# Patient Record
Sex: Male | Born: 1935
Health system: Southern US, Community
[De-identification: ages and names within clinical notes are randomized; demographics above are authoritative.]

## PROBLEM LIST (undated history)

## (undated) DIAGNOSIS — I2510 Atherosclerotic heart disease of native coronary artery without angina pectoris: Secondary | ICD-10-CM

## (undated) DIAGNOSIS — E119 Type 2 diabetes mellitus without complications: Secondary | ICD-10-CM

## (undated) DIAGNOSIS — G4733 Obstructive sleep apnea (adult) (pediatric): Secondary | ICD-10-CM

## (undated) DIAGNOSIS — I10 Essential (primary) hypertension: Secondary | ICD-10-CM

## (undated) DIAGNOSIS — I739 Peripheral vascular disease, unspecified: Secondary | ICD-10-CM

## (undated) DIAGNOSIS — E785 Hyperlipidemia, unspecified: Secondary | ICD-10-CM

## (undated) DIAGNOSIS — J101 Influenza due to other identified influenza virus with other respiratory manifestations: Secondary | ICD-10-CM

## (undated) DIAGNOSIS — M1990 Unspecified osteoarthritis, unspecified site: Secondary | ICD-10-CM

## (undated) DIAGNOSIS — Z9989 Dependence on other enabling machines and devices: Secondary | ICD-10-CM

## (undated) HISTORY — DX: Peripheral vascular disease, unspecified: I73.9

## (undated) HISTORY — PX: CARDIAC CATHETERIZATION: SHX172

## (undated) HISTORY — PX: NM MYOCAR PERF WALL MOTION: HXRAD629

## (undated) HISTORY — DX: Atherosclerotic heart disease of native coronary artery without angina pectoris: I25.10

## (undated) HISTORY — DX: Hyperlipidemia, unspecified: E78.5

## (undated) HISTORY — DX: Obstructive sleep apnea (adult) (pediatric): G47.33

## (undated) HISTORY — DX: Essential (primary) hypertension: I10

## (undated) HISTORY — DX: Dependence on other enabling machines and devices: Z99.89

## (undated) HISTORY — DX: Type 2 diabetes mellitus without complications: E11.9

---

## 2001-12-21 DIAGNOSIS — I739 Peripheral vascular disease, unspecified: Secondary | ICD-10-CM

## 2001-12-21 HISTORY — DX: Peripheral vascular disease, unspecified: I73.9

## 2002-05-19 ENCOUNTER — Ambulatory Visit (HOSPITAL_COMMUNITY): Admission: RE | Admit: 2002-05-19 | Discharge: 2002-05-19 | Payer: Self-pay | Admitting: *Deleted

## 2002-08-31 ENCOUNTER — Ambulatory Visit (HOSPITAL_COMMUNITY): Admission: RE | Admit: 2002-08-31 | Discharge: 2002-08-31 | Payer: Self-pay | Admitting: Cardiology

## 2004-11-10 ENCOUNTER — Encounter: Admission: RE | Admit: 2004-11-10 | Discharge: 2004-11-10 | Payer: Self-pay | Admitting: Endocrinology

## 2004-11-14 ENCOUNTER — Encounter: Admission: RE | Admit: 2004-11-14 | Discharge: 2004-11-14 | Payer: Self-pay | Admitting: Endocrinology

## 2005-12-21 DIAGNOSIS — I2510 Atherosclerotic heart disease of native coronary artery without angina pectoris: Secondary | ICD-10-CM

## 2005-12-21 HISTORY — DX: Atherosclerotic heart disease of native coronary artery without angina pectoris: I25.10

## 2006-04-23 ENCOUNTER — Encounter: Admission: RE | Admit: 2006-04-23 | Discharge: 2006-04-23 | Payer: Self-pay | Admitting: Neurosurgery

## 2006-05-18 ENCOUNTER — Ambulatory Visit (HOSPITAL_COMMUNITY): Admission: RE | Admit: 2006-05-18 | Discharge: 2006-05-19 | Payer: Self-pay | Admitting: Neurosurgery

## 2006-08-31 HISTORY — PX: CORONARY ARTERY BYPASS GRAFT: SHX141

## 2006-09-07 ENCOUNTER — Inpatient Hospital Stay (HOSPITAL_COMMUNITY): Admission: RE | Admit: 2006-09-07 | Discharge: 2006-09-13 | Payer: Self-pay | Admitting: Cardiothoracic Surgery

## 2006-10-07 ENCOUNTER — Encounter: Admission: RE | Admit: 2006-10-07 | Discharge: 2006-10-07 | Payer: Self-pay | Admitting: Cardiothoracic Surgery

## 2006-10-14 ENCOUNTER — Encounter (HOSPITAL_COMMUNITY): Admission: RE | Admit: 2006-10-14 | Discharge: 2007-01-12 | Payer: Self-pay | Admitting: Cardiology

## 2006-11-23 ENCOUNTER — Ambulatory Visit (HOSPITAL_BASED_OUTPATIENT_CLINIC_OR_DEPARTMENT_OTHER): Admission: RE | Admit: 2006-11-23 | Discharge: 2006-11-23 | Payer: Self-pay | Admitting: Orthopedic Surgery

## 2007-01-13 ENCOUNTER — Encounter (HOSPITAL_COMMUNITY): Admission: RE | Admit: 2007-01-13 | Discharge: 2007-04-05 | Payer: Self-pay | Admitting: Cardiology

## 2007-11-11 ENCOUNTER — Ambulatory Visit (HOSPITAL_COMMUNITY): Admission: RE | Admit: 2007-11-11 | Discharge: 2007-11-11 | Payer: Self-pay | Admitting: *Deleted

## 2011-05-05 NOTE — Op Note (Signed)
Rick Melendez, Rick Melendez                ACCOUNT NO.:  [id number]   MEDICAL RECORD NO.:  [id number]          PATIENT TYPE:  AMB   LOCATION:  ENDO                         FACILITY:  [facility]   PHYSICIAN:  Georgiana Spinner, M.D.    DATE OF BIRTH:  1936-06-11   DATE OF PROCEDURE:  DATE OF DISCHARGE:                               OPERATIVE REPORT   PROCEDURE:  Colonoscopy.   ANESTHESIA:  Fentanyl 60 mcg, Versed 6 mg.   INDICATIONS:  Colon polyps.   DESCRIPTION OF PROCEDURE:  With the patient mildly sedated in the left  lateral decubitus position, a rectal examination was performed which was  unremarkable to my exam. Subsequently the Pentax videoscopic colonoscope  was inserted into the rectum and passed under direct vision to reach the  cecum identified by ileocecal valve and appendiceal orifice both of  which were photographed. From this point, the colonoscope was slowly  withdrawn taking circumferential views of the colonic mucosa stopping  only in the rectum which appeared normal on direct and showed  hemorrhoids on retroflexed view. The endoscope was straightened and  withdrawn.  The patient's vital signs and pulse oximeter remained  stable.  The patient tolerated the procedure well without apparent  complications.   FINDINGS:  Internal hemorrhoids otherwise an unremarkable exam.   PLAN:  Consider repeat examination in 5 years.           ______________________________  Georgiana Spinner, M.D.     GMO/MEDQ  D:  11/11/2007  T:  11/11/2007  Job:  [id number]

## 2011-05-08 NOTE — Op Note (Signed)
NAMEJAROME, Rick Melendez                ACCOUNT NO.:  [id number]   MEDICAL RECORD NO.:  [id number]          PATIENT TYPE:  AMB   LOCATION:  SDS                          FACILITY:  [facility]   PHYSICIAN:  Reinaldo Meeker, M.D. DATE OF BIRTH:  Sep 24, 1936   DATE OF PROCEDURE:  05/18/2006  DATE OF DISCHARGE:                                 OPERATIVE REPORT   PREOPERATIVE DIAGNOSES:  Herniated disk and spinal stenosis C3-4, C4-5, C5-  6.   POSTOPERATIVE DIAGNOSES:  Herniated disk and spinal stenosis C3-4, C4-5, C5-  6.   PROCEDURE:  C3-4, C4-5, C5-6 anterior cervical diskectomy with bone bank  fusion followed by Accufix anterior cervical plating.   SURGEON:  Reinaldo Meeker, M.D.   ASSISTANT:  Kathaleen Maser. Pool, M.D.   DESCRIPTION OF PROCEDURE:  After being placed in the supine position in 10  pounds halter traction, the patient's neck was prepped and draped in the  usual sterile fashion. Localizing fluoroscopy was used prior to incision to  identify the appropriate level. A transverse incision was made in the right  anterior neck, started at the midline and headed towards the medial aspect  of the sternocleidomastoid muscle. The platysma muscle was then incised  transversely. The natural fascial plane between the strap medially and the  sternocleidomastoid laterally was identified and followed down to the  anterior aspect of the cervical spine. The longus colli muscles were  identified, split in the midline, and stripped away bilaterally with the  Kitner resector and the unipolar coagulation. A self retaining retractor was  placed for exposure. Fluoroscopy showed approach to the appropriate levels.  Large spurs from C4-5 and C5-6 were removed. The disks were then incised  with a 15 blade. Using a high speed drill, the disk spaces were widened as  they were markedly collapsed particularly C4-5 and C5-6. This was carried  down deep within the disk space and any residual disk material was  removed  with the pituitary rongeurs and curettes. At all three levels, approximately  95% of the disk material and bony overgrowth was removed. At this time, the  microscope was draped, brought on the field and used for the remainder of  the case. Starting at C3-4, a high-speed drill was used to remove the  remaining bony overgrowth until the posterior longitudinal ligament was  identified. This was removed in a piecemeal fashion. Thorough decompression  of the spinal dura was carried out until the spinal dura and proximal nerve  roots were well visualized bilaterally. A similar procedure was then carried  out at C4-5 once again using the high-speed drill to remove the remainder of  the bony overgrowth and then removing all residual disk material along with  the posterior longitudinal ligament which was removed in a piecemeal  fashion. Once again thorough spinal decompression was carried out  bilaterally until the proximal nerve roots could be identified. Finally,  attention was turned to C5-6 where once again a similar procedure was  carried out. Once again bony overgrowth was removed until the posterior  longitudinal ligament was identified. This was once  again removed in a  piecemeal fashion along with herniated disk material and bony overgrowth  until the spinal dura was well decompressed bilaterally. Proximal femoral  decompression was carried out bilaterally at this level as well. At this  time inspection was carried out in all directions at all three levels for  any evidence of residual compression and none could be identified. Large  amounts of irrigation were carried out. Measurements were taken and two 7 mm  plugs and one 8 mm plug was reconstituted. After irrigating once more and  confirming hemostasis, the plugs were then packed without difficulty with  the 7 mm plugs going into C3-4 and C5-6 and the 8 mm plug going into C4-5.  Fluoroscopy showed the plugs to be in good  position. An appropriate length  Accufix anterior cervical plate was then chosen. Under fluoroscopic  guidance, drill holes were placed followed by placement of 13 mm screws x8.  Locking mechanism was secured at all levels bilaterally and final  fluoroscopy showed the plates, screws and plugs all to be in good position.  At this time, large amounts of irrigation were carried out. Any bleeding was  controlled with bipolar coagulation. The wound was then closed using  interrupted Vicryl on the platysma muscle, inverted 5-0 PDS on the  subcuticular area and Steri-Strips on the skin. A sterile dressing was then  applied along with a Philadelphia collar. The patient was extubated and  taken to the recovery room in stable condition.           ______________________________  Reinaldo Meeker, M.D.     ROK/MEDQ  D:  05/18/2006  T:  05/18/2006  Job:  [id number]

## 2011-05-08 NOTE — Op Note (Signed)
Rick Melendez, Rick Melendez                ACCOUNT NO.:  [id number]   MEDICAL RECORD NO.:  [id number]          PATIENT TYPE:  INP   LOCATION:  2305                         FACILITY:  [facility]   PHYSICIAN:  Sheliah Plane, MD    DATE OF BIRTH:  07-13-1936   DATE OF PROCEDURE:  09/07/2006  DATE OF DISCHARGE:                                 OPERATIVE REPORT   PREOPERATIVE DIAGNOSIS:  Coronary occlusive disease, with left main  obstruction.   POSTOPERATIVE DIAGNOSIS:  Coronary occlusive disease, with left main  obstruction.   SURGICAL PROCEDURE:  Coronary artery bypass grafting x3, with a left  internal mammary artery to the left anterior descending coronary artery,  reverse saphenous vein graft to the first obtuse marginal coronary artery,  reverse saphenous vein graft to the distal right coronary artery, with right  leg Endovein harvesting.   SURGEON:  Sheliah Plane, M.D.   FIRST ASSISTANT:  Salvatore Decent. Cornelius Moras, M.D.   SECOND ASSISTANT:  Pecola Leisure, PA.   BRIEF HISTORY:  The patient is a 75 year old male followed by Dr. Juleen China for  diabetes, who presents with a positive stress test.  He underwent cardiac  catheterization by Dr. Aleen Campi, which demonstrated significant proximal  right coronary artery disease of greater than 70-80%.  In addition, he had  evidence of greater than 80% left main obstruction.  The patient was  catheterized in the outpatient catheterization lab facility, and was  referred electively to the office for bypass surgery.  With the patient's  significant disease, coronary artery bypass grafting was recommended to the  patient, who agreed and signed informed consent.   DESCRIPTION OF THE PROCEDURE:  With Swan-Ganz and arterial line monitors in  place, the patient underwent general endotracheal anesthesia without  incident.  The skin of the chest and legs was prepped with Betadine and  draped in the usual sterile manner.  Using the Guidant Endovein harvesting  system, vein was harvested from the right thigh and was of good quality and  caliber.  Median sternotomy was performed.  Left internal mammary artery was  dissected down as a pedicle graft.  The distal artery was divided and had  good free flow.  Pericardium was opened.  Overall ventricular function  appeared preserved.  The patient was systemically heparinized.  Ascending  aorta and the right atrium cannulated, and the aortic root vent cardioplegia  needle was introduced into the ascending aorta.  The patient was placed on  cardiopulmonary bypass, 2.4 liters/minute/sq m.  Sites of anastomosis were  selected and dissected at the epicardium.  The patient's body temperature  was cooled to 30 degrees.  Aortic cross-clamp was applied, and 500 cc of  cold blood potassium cardioplegia was administered, with rapid diastolic  arrest of the heart.  Myocardial septal temperatures were monitored  throughout the cross-clamp period.  Attention was turned first to the OM1  vessel, which was partially intramyocardial, and it was of reasonable size  and admitted a 1.5 mm probe.  Using a running 7-0 Prolene, distal  anastomosis was performed with 7-0 Prolene.  Additional cold blood  cardioplegia  was administered down the vein graft.  The distal right  coronary artery was then identified, opened, and was 2 mm in size.  Using a  running 7-0 Prolene, distal anastomosis was performed.  Additional cold  blood cardioplegia was administered down the vein grafts.  Attention was  then turned to the left anterior descending coronary artery, which was the  smallest of the three vessels, at 1.2-1.3 mm in size.  Using a running 8-0  Prolene, the left internal mammary artery was anastomosed to the left  anterior descending coronary artery.  With the release of the bulldog on the  mammary artery, there was appropriate rise in myocardial septal temperature.  Bulldog was placed back on the mammary artery.  Additional cold  blood  cardioplegia was administered.  With the cross-clamp still in place, two  punch aortotomies were performed.  Each of the two vein grafts were  anastomoses to the ascending aorta.  Air was evacuated from the grafts and  the ascending aorta, and the aortic cross-clamp was removed, with a total  cross-clamp time of 62 minutes.  Sites of anastomosis were inspected and  were free of bleeding.  The patient spontaneously converted to a sinus  rhythm.  He was then ventilated and weaned from cardiopulmonary bypass  without difficulty.  He was decannulated in the usual fashion.  Protamine  sulfate was administered.  With the operative field hemostatic, two atrial  and two ventricular pacing wires were applied.  A left pleural tube and a  single mediastinal Blake drain were left in place.  Pericardium was loosely  reapproximated.  Sternum was closed with #6 stainless steel wire.  Fascia  was closed with interrupted 0 Vicryl, running 3-0 Vicryl in the subcutaneous  tissue, 4-0 subcuticular stitch in the skin edges.  Dry dressings were  applied.  Sponge and needle count was reported as correct at the completion  of the procedure.  The patient tolerated the procedure without obvious  complication and was transferred to the surgical intensive care unit for  further postoperative care.      Sheliah Plane, MD  Electronically Signed     EG/MEDQ  D:  09/09/2006  T:  09/09/2006  Job:  [id number]   cc:   Brooke Bonito, M.D.  Antionette Char, MD

## 2011-05-08 NOTE — Procedures (Signed)
[facility]  Patient:    Rick Melendez, FOLKS Visit Number: [id number] MRN: [id number]          Service Type: END Location: ENDO Attending Physician:  Sabino Gasser Dictated by:   Sabino Gasser, M.D. Proc. Date: 05/19/02 Admit Date:  05/19/2002                             Procedure Report  PROCEDURE:  Colonoscopy.  INDICATIONS FOR PROCEDURE:  Colon polyps, rectal bleeding, colon cancer screening.  ANESTHESIA:  Demerol 70, Versed 6 mg.  DESCRIPTION OF PROCEDURE:  With the patient mildly sedated in the left lateral decubitus position, the Olympus videoscopic colonoscope was inserted in the rectum after a normal rectal exam and passed under direct vision to the  cecum identified by the ileocecal valve and appendiceal orifice. From this point, the colonoscope was slowly withdrawn taking circumferential views of the entire colonic mucosa, stopping in the rectum which appeared normal in direct and showed hemorrhoids on retroflexed view. The endoscope was straightened and withdrawn. The patients vital signs and pulse oximeter remained stable. The patient tolerated the procedure well without apparent complications.  FINDINGS:  Internal hemorrhoids, otherwise, unremarkable examination other than rare diverticulum of the sigmoid colon.  PLAN:  Repeat examination in five years. Dictated by:   Sabino Gasser, M.D. Attending Physician:  Sabino Gasser DD:  05/19/02 TD:  05/20/02 Job: [id number]

## 2011-05-08 NOTE — Op Note (Signed)
NAME:  Rick Melendez, Rick Melendez                          ACCOUNT NO.:  [id number]   MEDICAL RECORD NO.:  [id number]                   PATIENT TYPE:  OIB   LOCATION:  2872                                 FACILITY:  [facility]   PHYSICIAN:  Aram Candela. Tysinger, M.D.              DATE OF BIRTH:  1936-03-13   DATE OF PROCEDURE:  08/31/2002  DATE OF DISCHARGE:                                 OPERATIVE REPORT   PROCEDURES:  1. Insertion of sheath in right femoral artery.  2. Insertion of sheath in left femoral artery.  3. Catheter placement, abdominal aorta with pressure recordings.  4. Abdominal aortogram with bilateral lower extremity runoff.  5. Angioplasty with stent placement in the left common iliac artery.  6. Perclose of the right femoral artery.  7. Perclose of the left femoral artery.   INDICATION FOR PROCEDURES:  This 75 year old male presented with a history  of severe activity limiting claudication and a peripheral vascular segmental  pressure study showed evidence for severe stenosis or obstruction in the  left ileofemoral area.  He was then scheduled for angiography and possible  angioplasty.   DESCRIPTION OF PROCEDURE:  After signing an informed consent, the patient  was premedicated with 50 mg of Benadryl intravenously and brought to the  peripheral vascular angiography lab on the sixth floor at [facility]. Both groins were prepped and draped in a sterile fashion. A 5  French introducer sheath was inserted percutaneously into the right femoral  artery. A 5 French short pigtail catheter was introduced through the right  femoral sheath and advanced to the abdominal aorta.  Mid stream injections  were made through the abdominal aorta and distal aorta with angiography  performed of the abdominal aorta and bilateral lower extremities. After  noting a critical stenosis in the mid left common iliac artery and good  appearance of his vessels otherwise, we then discussed this finding  with the  patient and elected to proceed with an angioplasty procedure on the left  iliac artery lesion. We considered approaching from the right side, however,  the lesion was too close to the bifurcation to use a crossover catheter, so  therefore we anesthetized the left groin and inserted a 7 French long sheath  into the left femoral artery.  We then inserted a Wholey guide wire through  this long sheath, and after moderate difficulty we were able to advance the  [facility] wire through the left common iliac stenotic area.  We then advanced  the tip of the [facility] wire into the abdominal aorta. We then selected a 6 x  2 ultra-thin SDS balloon catheter which after proper preparation was  inserted over the guide wire and positioned within the lesion. One inflation  was made at 6 atmospheres for 60 seconds.  Further injections were made in  the left iliac artery. We then selected a 9 x 2.5 Express stent  system and  after proper preparation it was inserted over the guide wire and positioned  within the iliac lesion. The stent was then deployed with two inflations,  the first at 8 atmospheres for 24 seconds, and the second at 12 atmospheres  for 36 seconds. After the stent was deployed, the balloon catheter was  removed from the left femoral artery sheath and injections again through the  sheath revealed an excellent angiographic result with 0% residual lesion and  no evidence for dissection or clot.  The patient tolerated the procedure  well and no complications were noted. At the end of the procedure, both  sheaths were removed from the femoral arteries and hemostasis was easily  obtained in the right and left femoral arteries with Perclose closure  systems.   MEDICATIONS GIVEN:  Heparin 3000 units IV.   HEMODYNAMIC DATA:  Abdominal aortic pressure 157/81 with a mean of 109.  Left external iliac pressure 111/77 with a mean of 93.   CINE FINDINGS:  ABDOMINAL AORTOGRAM:  Abdominal aortogram  with bilateral  lower extremity runoff.   Abdominal aorta:  The suprarenal abdominal aorta appears normal.  The  infrarenal abdominal aorta is mildly irregular with an eccentric plaque in  the distal abdominal aorta on the right border causing a 20-30% narrowing.   Iliac arteries:  The right common iliac appears normal with only minor  irregularities. There was normal antegrade flow. The right external iliac  artery appears normal. The right superficial femoral artery popliteal tibial  and peroneal arteries all appeared normal.   Left iliac artery:  The left common iliac artery had a focal eccentric 95%  stenosis which was approximately 15-20 mm in length.  This was followed by  normal appearance of his left external iliac and normal appearance of his  superficial femoral popliteal tibial and peroneal arteries.   Angioplasty cine:  Cine taken during the angioplasty procedure showed proper  positioning of the left femoral sheath, proper positioning of the guide wire  across the lesion and proper positioning of the pre-dilatation balloon and  stent deployment system. A good balloon form was obtained in all three  dilations.  Final injections into the left iliac artery within the stent showed an  excellent angiographic result with 0% residual lesion and no evidence for  dissection or clot.  There was reestablishment of normal runoff.   FINAL DIAGNOSES:  1. Critical stenosis, left common iliac artery.  2. Mild stenosis of distal abdominal aorta, 30%.  3. Normal-appearing lower extremity vessels including the femoral popliteal,     tibial and peroneal distributions.  4. Minor irregularity, right common iliac artery.  5. Normal external iliac arteries bilaterally.  6. Successful angioplasty with balloon pre-dilatation and stent placement in     the left common iliac artery.  7. Successful Perclose of the right and left femoral arteries.   DISPOSITION:  The patient will be monitored in  the short-stay unit prior to  discharge later today.  Will start on Plavix for three months and continue  his regular dose of aspirin.                                                    John R. Tysinger, M.D.    JRT/MEDQ  D:  08/31/2002  T:  09/02/2002  Job:  [id number]  cc:   Brooke Bonito, M.D.  [address]  [place]  [place] [address]  Fax: [phone]   Peripheral Angiography Laboratory

## 2011-05-08 NOTE — Discharge Summary (Signed)
NAMEARMSTRONG, CREASY                ACCOUNT NO.:  [id number]   MEDICAL RECORD NO.:  [id number]          PATIENT TYPE:  AMB   LOCATION:  DSC                          FACILITY:  [facility]   PHYSICIAN:  Sheliah Plane, MD    DATE OF BIRTH:  1936/04/21   DATE OF ADMISSION:  09/07/2006  DATE OF DISCHARGE:  09/13/2006                               DISCHARGE SUMMARY   PRIMARY ADMITTING DIAGNOSIS:  Severe three-vessel coronary artery  disease.   ADDITIONAL/DISCHARGE DIAGNOSES:  1. Severe three-vessel coronary artery disease.  2. Hypertension.  3. Type 2 diabetes mellitus.  4. Hyperlipidemia.  5. History of Grave's disease status post radiation therapy.  6. History of colon polyps.  7. Previous history of tobacco abuse.   PROCEDURES PERFORMED:  Coronary artery bypass grafting x3 (left internal  mammary artery to the LAD, saphenous vein graft to the right coronary  artery, saphenous vein graft to the circumflex).  1. Endoscopic vein harvest right thigh.   HISTORY:  The patient is a 75 year old male who has a longstanding  history of hypertension and has been followed with yearly stress tests.  On his most recent test performed August 2007 was positive with ST  depression.  Because of this finding, he underwent cardiac  catheterization on August 31, 2006, and was found to have significant  three-vessel coronary artery disease with left main obstruction.  His LV  function was normal.  Because of his multivessel disease, he was  subsequently referred to Dr. Sheliah Plane for outpatient  consideration of surgical revascularization.  Dr. Tyrone Sage reviewed the  patient's films and agreed that his best course of action would be to  proceed with surgery at this time.  He explained the risks, benefits and  alternatives of the procedure to the patient, and because he was on  Plavix, it was decided to proceed with surgery after he has been on  Plavix as well as Celebrex for 5-7 days.  He was  agreeable to outpatient  admission and was scheduled for surgery on September 07, 2006.   HOSPITAL COURSE:  Mr. Gumbs was admitted to [facility] on  September 07, 2006, and taken to the operating room where underwent CABG  x3 performed by Dr. Tyrone Sage as described in detail above.  He tolerated  the procedure well and was transferred to the SICU in stable condition.  He was able to be extubated shortly after surgery.  He was  hemodynamically stable and doing well on postop day #1.  The  postoperative course was complicated by a mild postoperative ileus.  He  remained in the SICU for close monitoring, and he was treated  conservatively over the first 72 hours postoperatively.  By postop day  #3, his abdomen was still somewhat distended, but he had improved bowel  sounds and was passing flatus.  He was subsequently able to be  transferred to the floor.  Over the course of the next several days his  bowel function slowly returned and he was started on clear liquid diet.  This was advanced as tolerated and at the time of  discharge he was  taking a regular diet without difficulty.  Otherwise, his postoperative  course was uneventful.  He was able to ambulate with cardiac rehab phase  one, as well as independently, and at the time of discharge was  progressing well.  He remained afebrile and all vital signs were stable  throughout his admission.  He was mildly volume overloaded and was  diuresed back down to within 2 kg of his preoperative weight.  At the  time of discharge, his incisions were all healing well.  His labs showed  sodium 137, potassium 3.8 which was supplemented, BUN 30, creatinine  1.5, hemoglobin 8.8, hematocrit 25.9, white count 8.7, platelets 199.  By postop day #6, he was showing significant improvement, and it was  felt that he could be discharged home.   DISCHARGE MEDICATIONS:  1. Enteric-coated aspirin 325 mg daily.  2. Toprol XL 25 mg daily.  3. Advicor  1000/40 mg daily.  4. Glucotrol XL 5 mg daily.  5. Plavix 75 mg daily.  6. Nexium 40 mg daily.  7. Triamterene/HCTZ 37.5/25 mg daily.  8. Tylox 1-2 q.4 h p.r.n. for pain.   DISCHARGE INSTRUCTIONS:  He is asked to refrain from driving, heavy  lifting or strenuous activity.  He may continue ambulating daily and  using his incentive spirometer.  He may shower daily and clean his  incisions with soap and water.  He will continue his same preoperative  diet.   DISCHARGE FOLLOWUP:  He will need to see Dr. Aleen Campi Back in the office  in 2 weeks and should call for an appointment.  He will then follow up  with Dr. Tyrone Sage in 3 weeks and our office will contact him with an  appointment time as well as an appointment for a chest x-ray at [facility].  If he experiences any problems or has questions in the  interim, he is asked to contact our office immediately.      Coral Ceo, P.A.      Sheliah Plane, MD  Electronically Signed    GC/MEDQ  D:  11/19/2006  T:  11/20/2006  Job:  [id number]   cc:   Brooke Bonito, M.D.  Antionette Char, MD

## 2011-05-08 NOTE — Consult Note (Signed)
NAMEARIS, EVEN                ACCOUNT NO.:  [id number]   MEDICAL RECORD NO.:  [id number]            PATIENT TYPE:   LOCATION:                                 FACILITY:   PHYSICIAN:  Sheliah Plane, MD         DATE OF BIRTH:   DATE OF CONSULTATION:  09/02/2006  DATE OF DISCHARGE:                                   CONSULTATION   REQUESTING PHYSICIAN:  Dr. Aleen Campi.   FOLLOW-UP CARDIOLOGIST:  Dr. Aleen Campi.   PRIMARY CARE PHYSICIAN:  Dr. Juleen China.   REASON FOR CONSULTATION:  Three-vessel coronary artery disease.   HISTORY OF PRESENT ILLNESS:  Patient is a 75 year old male with a long  history of hypertension who has had yearly stress tests for several years  but recently had a stress test August 19, 2006 which was early positive with  ST depression.  The chart notes the patient had chest pain.  Patient denies  any chest pain, chest tightness or shortness of breath.  However, because of  the positive stress test, he underwent cardiac catheterization by Dr.  Aleen Campi at the [facility] on August 31, 2006.  He  was found to have significant 3-vessel disease with left main obstruction  and is referred to the office as an outpatient for consideration for  surgery.  The patient denies any previous history of myocardial infarction.  Denies any definite chest pain but does have a long history of diabetes, and  both he and his family note generalized increasing fatigue recently though  he denies any exertional or rest-related chest pain.  Denies exertional  shortness of breath.  He has had stent placed in his left femoral artery in  the past.  He has a history of hypertension and hyperlipidemia, type 2  diabetes for 10 years.  Is unaware of what his hemoglobin A1c is.  Notes his  glucoses usually run around 120.  He is a remote smoker, quit 10 years ago.  Family history is significant for bladder cancer in his father who died at  age 49, renal failure in his mother  who died at age 84.  He has one uncle  who has cardiac and vascular disease.  Patient had a history of claudication  in the left leg but notes that since the stent was placed in 2003 he has had  no further claudication.   Past medical history is significant for arthritis especially in the hips and  knees.  He has a history of Graves disease treated with radiation to his  eyes at [facility] in 1986, history of carpal tunnel syndrome left greater than  right, history of colonic polyps.   Previous surgery includes colon polypectomy, cervical laminectomy 10 months  ago, right thumb and ring finger surgery, history of leg fracture many years  ago.   SOCIAL HISTORY:  Patient is married.  He is employed as a Nutritional therapist.   CURRENT MEDICATIONS:  1. Plavix 75 mg a day.  2. Zegerid 40 mg a day.  3. Advicor 1000/40 mg a day.  4. Amicar  4 tablets a day.  5. Celebrex 200 mg a day.  6. Glucotrol XL 5 mg a day.  7. Triamterene hydrochlorothiazide 37.5/25 mg a day.  8. Cozaar 50 mg a day.  9. Aspirin 81 mg a day.   REVIEW OF SYSTEMS:  CARDIAC:  The patient denies chest pain, resting  shortness of breath, exertional shortness of breath, orthopnea, presyncope,  syncope, palpitations or lower extremity edema.  GENERAL:  Patient denies  any constitutional symptoms such as fever, chills or night sweats.  He does  note general fatigue.  Denies any eye difficulty currently though did note  that he had had radiation to his eyes treated for a Graves disease at [facility].  He does wear glasses.  He does note that he is hard of hearing.  He wears  full upper and partial lower plates.  He no longer smokes but on further  questioning does note that he still chews tobacco.  Has a history of  indigestion and heartburn which he takes Zegerid for.  He notes nocturia at  least once per night and recently twice per night.  He does have  musculoskeletal discomfort in his hips and knees, legs, fatigue.  Has had  some situational  depression and anxiety.  ENDOCRINE:  History of Graves  disease in the past.   ALLERGIES:  Has no drug allergies.   PHYSICAL EXAMINATION:  GENERAL:  Patient is awake, alert, neurologically  intact and able to relate his history, moderately obese.  VITAL SIGNS:  Blood pressure is 140/64, heart rate 78, respiratory rate 18,  O2 sat is 95%.  He is 5 feet 6 inches tall, 230 pounds.  NECK:  He has no carotid bruits.  LUNGS:  Breath sounds are distant but without wheezing.  Lungs are clear.  HEART:  There is no murmur or aortic stenosis.  The aorta is not palpably  enlarged.  ABDOMEN:  Obese without palpable masses or organomegaly.  EXTREMITIES:  Lower extremities appear to have adequate veins bilaterally  along the saphenous vein.  There are no ischemic changes in the leg.  He has  2+ DP and PT pulses bilaterally.   Cardiac catheterization films are reviewed.  The patient has 3-vessel  coronary artery disease  with 80% distal left main, 30% proximal LAD, 60%  proximal circumflex, 70% stenosis in the mid right coronary artery.  Overall  ejection fraction appears preserved.  The renal arteries appear normal.  The  hemodynamic data in the report says there is no gradient across the aortic  valve; however, the pressures listed show a left ventricular pressure of 125  and an aortic pressure of 120.   At the time of his office visit, the laboratory findings from Dr. Adelene Idler  office did not accompany the patient.   IMPRESSION:  Patient with positive stress test, significant 3-vessel  coronary artery disease including a left main component on Plavix and  Celebrex and with a history of diabetes.  With the patient's coronary  anatomy, coronary artery bypass graft would be the optimal treatment.  The  risks of surgery including death, infection, stroke, myocardial infarction,  bleeding, blood transfusion were all discussed with the patient in detail. Because of the risk of bleeding, we have  asked him to stop his Plavix, stop  his Celebrex, stop his Amicar immediately and the day prior to surgery hold  his Diovan.  The patient is not on a beta blocker when he saw me in the  office, but he  is started on  Lopressor 12.5 mg p.o. b.i.d.  We will plan to proceed with surgery on  Tuesday, September 07, 2006.  This will allow 5 days off Plavix and Celebrex  to decrease the risk of bleeding.  The patient is agreeable with proceeding  with this plan.  The patient's questions and family's questions have been  answered.      Sheliah Plane, MD  Electronically Signed     EG/MEDQ  D:  09/02/2006  T:  09/02/2006  Job:  [id number]   cc:   Brooke Bonito, M.D.  Antionette Char, MD

## 2011-05-08 NOTE — Op Note (Signed)
[name], Melendez                ACCOUNT NO.:  [id number]   MEDICAL RECORD NO.:  [id number]          PATIENT TYPE:  AMB   LOCATION:  DSC                          FACILITY:  [facility]   PHYSICIAN:  Katy Fitch. Sypher, M.D. DATE OF BIRTH:  Dec 15, 1936   DATE OF PROCEDURE:  11/23/2006  DATE OF DISCHARGE:                               OPERATIVE REPORT   PREOPERATIVE DIAGNOSIS:  Chronic entrapped neuropathy, left median nerve  at carpal tunnel.   POSTOPERATIVE DIAGNOSIS:  Chronic entrapped neuropathy, left median  nerve at carpal tunnel.   OPERATION:  Release of left transverse carpal ligament.   OPERATING SURGEON:  Josephine Igo, M.D.   ASSISTANT:  Molly Maduro Dasnoit PA-C.   ANESTHESIA:  IV regional block proximal brachium left arm, supplemented  by IV sedation, supervising anesthesiologist Dr. Jean Rosenthal.   INDICATIONS:  Rick Melendez is a 75 year old gentleman referred through  the courtesy of Dr. Adela Lank for evaluation and management of hand  numbness.  Clinical examination revealed signs of bilateral carpal  tunnel syndrome.  Rick Melendez had background  medical problems of type 2  diabetes, hypertension and atherosclerotic cardiovascular disease.   PREOPERATIVE EVALUATION:  Suggested that a cardiac screening was  appropriate.  After appropriate cardiac screening Rick Melendez was noted  have critical three-vessel coronary artery disease.  He was subsequently  referred and underwent coronary artery bypass grafting.   He has now recovered from surgery with Dr. Tyrone Sage and has been  released for hand surgery.  After informed consent he is brought to the  operating at this time anticipating release of the left transverse  carpal ligament.   Preoperatively, we had also noted some tension in his pre tendinous  fibers to the left long finger.  We had initially advised to consider  fascial release.  However, on examination in the holding area, his  fascial swelling had diminished, therefore  we will not proceed with  release of the fascia today.   After informed consent Rick Melendez is brought to the operating room at  this time.   PROCEDURE:  Rick Melendez is brought to operating room and placed in  supine position on the operating table.   Following light sedation, a IV regional block was placed on the left  with a proximal brachial tourniquet at 300 mmHg.   The arm was prepped with Betadine soap solution, sterilely draped.  After 12 minutes anesthesia was excellent.  The procedure commenced with  short incision in the line of the ring finger in the palm.  Subcutaneous  tissue were carefully divided revealing the palmar fascia.  This was  split longitudinally to reveal the common sensory branch of the median  nerve.   These were followed back to the transverse carpal ligament which was  gently isolated from the median nerve utilizing a Insurance risk surveyor.   The transverse carpal ligaments gently released with scissors along its  ulnar border extending into the distal forearm.  No masses or other  predicaments were noted.   Bleeding points along the margin of the released ligament were  electrocauterized with bipolar current  followed by repair of the skin  with intradermal 3-0 Prolene suture.   A compressive dressing was applied with a volar plaster splint  maintaining the wrist in 5 degrees of dorsiflexion.   For aftercare Rick Melendez is placed on Percocet 5 mg one p.o. every four  to six hours p.r.n. pain.  He is advised to begin immediate range of  motion excises to his fingers.  He will keep his dressing dry and return  to see me for follow-up in seven to nine days.      Katy Fitch Sypher, M.D.  Electronically Signed     RVS/MEDQ  D:  11/23/2006  T:  11/23/2006  Job:  [id number]   cc:   Brooke Bonito, M.D.

## 2011-07-08 HISTORY — PX: DOPPLER ECHOCARDIOGRAPHY: SHX263

## 2012-02-23 DIAGNOSIS — E119 Type 2 diabetes mellitus without complications: Secondary | ICD-10-CM | POA: Diagnosis not present

## 2012-03-01 DIAGNOSIS — E119 Type 2 diabetes mellitus without complications: Secondary | ICD-10-CM | POA: Diagnosis not present

## 2012-03-01 DIAGNOSIS — L821 Other seborrheic keratosis: Secondary | ICD-10-CM | POA: Diagnosis not present

## 2012-03-01 DIAGNOSIS — E789 Disorder of lipoprotein metabolism, unspecified: Secondary | ICD-10-CM | POA: Diagnosis not present

## 2012-03-01 DIAGNOSIS — Z85828 Personal history of other malignant neoplasm of skin: Secondary | ICD-10-CM | POA: Diagnosis not present

## 2012-03-01 DIAGNOSIS — R0989 Other specified symptoms and signs involving the circulatory and respiratory systems: Secondary | ICD-10-CM | POA: Diagnosis not present

## 2012-03-01 DIAGNOSIS — R0609 Other forms of dyspnea: Secondary | ICD-10-CM | POA: Diagnosis not present

## 2012-03-01 DIAGNOSIS — C44319 Basal cell carcinoma of skin of other parts of face: Secondary | ICD-10-CM | POA: Diagnosis not present

## 2012-03-01 DIAGNOSIS — I10 Essential (primary) hypertension: Secondary | ICD-10-CM | POA: Diagnosis not present

## 2012-03-01 DIAGNOSIS — L570 Actinic keratosis: Secondary | ICD-10-CM | POA: Diagnosis not present

## 2012-03-04 DIAGNOSIS — I2510 Atherosclerotic heart disease of native coronary artery without angina pectoris: Secondary | ICD-10-CM | POA: Diagnosis not present

## 2012-03-04 DIAGNOSIS — E782 Mixed hyperlipidemia: Secondary | ICD-10-CM | POA: Diagnosis not present

## 2012-03-04 DIAGNOSIS — E119 Type 2 diabetes mellitus without complications: Secondary | ICD-10-CM | POA: Diagnosis not present

## 2012-03-04 DIAGNOSIS — I10 Essential (primary) hypertension: Secondary | ICD-10-CM | POA: Diagnosis not present

## 2012-03-07 DIAGNOSIS — G4733 Obstructive sleep apnea (adult) (pediatric): Secondary | ICD-10-CM | POA: Diagnosis not present

## 2012-03-23 DIAGNOSIS — H4720 Unspecified optic atrophy: Secondary | ICD-10-CM | POA: Diagnosis not present

## 2012-03-23 DIAGNOSIS — H3500 Unspecified background retinopathy: Secondary | ICD-10-CM | POA: Diagnosis not present

## 2012-03-23 DIAGNOSIS — H52209 Unspecified astigmatism, unspecified eye: Secondary | ICD-10-CM | POA: Diagnosis not present

## 2012-03-23 DIAGNOSIS — H2510 Age-related nuclear cataract, unspecified eye: Secondary | ICD-10-CM | POA: Diagnosis not present

## 2012-04-07 DIAGNOSIS — C44319 Basal cell carcinoma of skin of other parts of face: Secondary | ICD-10-CM | POA: Diagnosis not present

## 2012-04-13 DIAGNOSIS — R079 Chest pain, unspecified: Secondary | ICD-10-CM | POA: Diagnosis not present

## 2012-04-13 DIAGNOSIS — R0602 Shortness of breath: Secondary | ICD-10-CM | POA: Diagnosis not present

## 2012-04-13 HISTORY — PX: OTHER SURGICAL HISTORY: SHX169

## 2012-04-22 ENCOUNTER — Encounter: Payer: Self-pay | Admitting: Cardiovascular Disease

## 2012-04-22 DIAGNOSIS — G4733 Obstructive sleep apnea (adult) (pediatric): Secondary | ICD-10-CM | POA: Diagnosis not present

## 2012-05-24 DIAGNOSIS — E789 Disorder of lipoprotein metabolism, unspecified: Secondary | ICD-10-CM | POA: Diagnosis not present

## 2012-05-27 DIAGNOSIS — E789 Disorder of lipoprotein metabolism, unspecified: Secondary | ICD-10-CM | POA: Diagnosis not present

## 2012-05-31 ENCOUNTER — Encounter: Payer: Self-pay | Admitting: Cardiovascular Disease

## 2012-05-31 DIAGNOSIS — G4733 Obstructive sleep apnea (adult) (pediatric): Secondary | ICD-10-CM | POA: Diagnosis not present

## 2012-06-01 DIAGNOSIS — L909 Atrophic disorder of skin, unspecified: Secondary | ICD-10-CM | POA: Diagnosis not present

## 2012-06-01 DIAGNOSIS — L919 Hypertrophic disorder of the skin, unspecified: Secondary | ICD-10-CM | POA: Diagnosis not present

## 2012-06-01 DIAGNOSIS — M25529 Pain in unspecified elbow: Secondary | ICD-10-CM | POA: Diagnosis not present

## 2012-06-01 DIAGNOSIS — E119 Type 2 diabetes mellitus without complications: Secondary | ICD-10-CM | POA: Diagnosis not present

## 2012-06-01 DIAGNOSIS — I2510 Atherosclerotic heart disease of native coronary artery without angina pectoris: Secondary | ICD-10-CM | POA: Diagnosis not present

## 2012-07-01 DIAGNOSIS — G4733 Obstructive sleep apnea (adult) (pediatric): Secondary | ICD-10-CM | POA: Diagnosis not present

## 2012-07-01 DIAGNOSIS — I70219 Atherosclerosis of native arteries of extremities with intermittent claudication, unspecified extremity: Secondary | ICD-10-CM | POA: Diagnosis not present

## 2012-07-01 HISTORY — PX: OTHER SURGICAL HISTORY: SHX169

## 2012-07-29 DIAGNOSIS — E789 Disorder of lipoprotein metabolism, unspecified: Secondary | ICD-10-CM | POA: Diagnosis not present

## 2012-07-29 DIAGNOSIS — M79609 Pain in unspecified limb: Secondary | ICD-10-CM | POA: Diagnosis not present

## 2012-07-29 DIAGNOSIS — G4730 Sleep apnea, unspecified: Secondary | ICD-10-CM | POA: Diagnosis not present

## 2012-08-04 DIAGNOSIS — E789 Disorder of lipoprotein metabolism, unspecified: Secondary | ICD-10-CM | POA: Diagnosis not present

## 2012-08-04 DIAGNOSIS — E119 Type 2 diabetes mellitus without complications: Secondary | ICD-10-CM | POA: Diagnosis not present

## 2012-08-11 DIAGNOSIS — E119 Type 2 diabetes mellitus without complications: Secondary | ICD-10-CM | POA: Diagnosis not present

## 2012-08-11 DIAGNOSIS — M7020 Olecranon bursitis, unspecified elbow: Secondary | ICD-10-CM | POA: Diagnosis not present

## 2012-08-11 DIAGNOSIS — M25559 Pain in unspecified hip: Secondary | ICD-10-CM | POA: Diagnosis not present

## 2012-08-11 DIAGNOSIS — M79609 Pain in unspecified limb: Secondary | ICD-10-CM | POA: Diagnosis not present

## 2012-08-26 DIAGNOSIS — I10 Essential (primary) hypertension: Secondary | ICD-10-CM | POA: Diagnosis not present

## 2012-08-26 DIAGNOSIS — E782 Mixed hyperlipidemia: Secondary | ICD-10-CM | POA: Diagnosis not present

## 2012-08-26 DIAGNOSIS — I2510 Atherosclerotic heart disease of native coronary artery without angina pectoris: Secondary | ICD-10-CM | POA: Diagnosis not present

## 2012-08-26 DIAGNOSIS — E119 Type 2 diabetes mellitus without complications: Secondary | ICD-10-CM | POA: Diagnosis not present

## 2012-09-06 DIAGNOSIS — G4733 Obstructive sleep apnea (adult) (pediatric): Secondary | ICD-10-CM | POA: Diagnosis not present

## 2012-10-13 DIAGNOSIS — L570 Actinic keratosis: Secondary | ICD-10-CM | POA: Diagnosis not present

## 2012-10-13 DIAGNOSIS — L723 Sebaceous cyst: Secondary | ICD-10-CM | POA: Diagnosis not present

## 2012-10-13 DIAGNOSIS — Z85828 Personal history of other malignant neoplasm of skin: Secondary | ICD-10-CM | POA: Diagnosis not present

## 2012-10-13 DIAGNOSIS — L821 Other seborrheic keratosis: Secondary | ICD-10-CM | POA: Diagnosis not present

## 2012-10-26 DIAGNOSIS — I10 Essential (primary) hypertension: Secondary | ICD-10-CM | POA: Diagnosis not present

## 2012-10-26 DIAGNOSIS — E789 Disorder of lipoprotein metabolism, unspecified: Secondary | ICD-10-CM | POA: Diagnosis not present

## 2012-10-26 DIAGNOSIS — J329 Chronic sinusitis, unspecified: Secondary | ICD-10-CM | POA: Diagnosis not present

## 2012-10-26 DIAGNOSIS — M7150 Other bursitis, not elsewhere classified, unspecified site: Secondary | ICD-10-CM | POA: Diagnosis not present

## 2012-10-26 DIAGNOSIS — E119 Type 2 diabetes mellitus without complications: Secondary | ICD-10-CM | POA: Diagnosis not present

## 2012-10-26 DIAGNOSIS — Z125 Encounter for screening for malignant neoplasm of prostate: Secondary | ICD-10-CM | POA: Diagnosis not present

## 2012-11-02 DIAGNOSIS — E789 Disorder of lipoprotein metabolism, unspecified: Secondary | ICD-10-CM | POA: Diagnosis not present

## 2012-11-02 DIAGNOSIS — E119 Type 2 diabetes mellitus without complications: Secondary | ICD-10-CM | POA: Diagnosis not present

## 2012-11-02 DIAGNOSIS — M25529 Pain in unspecified elbow: Secondary | ICD-10-CM | POA: Diagnosis not present

## 2012-11-02 DIAGNOSIS — M778 Other enthesopathies, not elsewhere classified: Secondary | ICD-10-CM | POA: Diagnosis not present

## 2012-11-02 DIAGNOSIS — Z23 Encounter for immunization: Secondary | ICD-10-CM | POA: Diagnosis not present

## 2012-11-23 DIAGNOSIS — G4733 Obstructive sleep apnea (adult) (pediatric): Secondary | ICD-10-CM | POA: Diagnosis not present

## 2012-11-23 DIAGNOSIS — E119 Type 2 diabetes mellitus without complications: Secondary | ICD-10-CM | POA: Diagnosis not present

## 2012-11-23 DIAGNOSIS — I2510 Atherosclerotic heart disease of native coronary artery without angina pectoris: Secondary | ICD-10-CM | POA: Diagnosis not present

## 2013-01-27 DIAGNOSIS — E789 Disorder of lipoprotein metabolism, unspecified: Secondary | ICD-10-CM | POA: Diagnosis not present

## 2013-03-01 DIAGNOSIS — E782 Mixed hyperlipidemia: Secondary | ICD-10-CM | POA: Diagnosis not present

## 2013-03-01 DIAGNOSIS — R609 Edema, unspecified: Secondary | ICD-10-CM | POA: Diagnosis not present

## 2013-03-01 DIAGNOSIS — I2510 Atherosclerotic heart disease of native coronary artery without angina pectoris: Secondary | ICD-10-CM | POA: Diagnosis not present

## 2013-03-01 DIAGNOSIS — G4733 Obstructive sleep apnea (adult) (pediatric): Secondary | ICD-10-CM | POA: Diagnosis not present

## 2013-03-08 DIAGNOSIS — E782 Mixed hyperlipidemia: Secondary | ICD-10-CM | POA: Diagnosis not present

## 2013-03-08 DIAGNOSIS — I2510 Atherosclerotic heart disease of native coronary artery without angina pectoris: Secondary | ICD-10-CM | POA: Diagnosis not present

## 2013-03-15 DIAGNOSIS — R079 Chest pain, unspecified: Secondary | ICD-10-CM | POA: Diagnosis not present

## 2013-03-15 DIAGNOSIS — I2510 Atherosclerotic heart disease of native coronary artery without angina pectoris: Secondary | ICD-10-CM | POA: Diagnosis not present

## 2013-03-15 DIAGNOSIS — G4730 Sleep apnea, unspecified: Secondary | ICD-10-CM | POA: Diagnosis not present

## 2013-04-05 DIAGNOSIS — E785 Hyperlipidemia, unspecified: Secondary | ICD-10-CM | POA: Diagnosis not present

## 2013-04-05 DIAGNOSIS — H60399 Other infective otitis externa, unspecified ear: Secondary | ICD-10-CM | POA: Diagnosis not present

## 2013-04-12 DIAGNOSIS — H60399 Other infective otitis externa, unspecified ear: Secondary | ICD-10-CM | POA: Diagnosis not present

## 2013-04-12 DIAGNOSIS — E785 Hyperlipidemia, unspecified: Secondary | ICD-10-CM | POA: Diagnosis not present

## 2013-04-18 DIAGNOSIS — W57XXXA Bitten or stung by nonvenomous insect and other nonvenomous arthropods, initial encounter: Secondary | ICD-10-CM | POA: Diagnosis not present

## 2013-04-18 DIAGNOSIS — C44221 Squamous cell carcinoma of skin of unspecified ear and external auricular canal: Secondary | ICD-10-CM | POA: Diagnosis not present

## 2013-04-18 DIAGNOSIS — L259 Unspecified contact dermatitis, unspecified cause: Secondary | ICD-10-CM | POA: Diagnosis not present

## 2013-04-18 DIAGNOSIS — T148 Other injury of unspecified body region: Secondary | ICD-10-CM | POA: Diagnosis not present

## 2013-04-18 DIAGNOSIS — Z85828 Personal history of other malignant neoplasm of skin: Secondary | ICD-10-CM | POA: Diagnosis not present

## 2013-04-18 DIAGNOSIS — D485 Neoplasm of uncertain behavior of skin: Secondary | ICD-10-CM | POA: Diagnosis not present

## 2013-04-18 DIAGNOSIS — L570 Actinic keratosis: Secondary | ICD-10-CM | POA: Diagnosis not present

## 2013-06-05 DIAGNOSIS — E119 Type 2 diabetes mellitus without complications: Secondary | ICD-10-CM | POA: Diagnosis not present

## 2013-06-05 DIAGNOSIS — E789 Disorder of lipoprotein metabolism, unspecified: Secondary | ICD-10-CM | POA: Diagnosis not present

## 2013-06-15 DIAGNOSIS — R0989 Other specified symptoms and signs involving the circulatory and respiratory systems: Secondary | ICD-10-CM | POA: Diagnosis not present

## 2013-06-15 DIAGNOSIS — E119 Type 2 diabetes mellitus without complications: Secondary | ICD-10-CM | POA: Diagnosis not present

## 2013-06-15 DIAGNOSIS — I10 Essential (primary) hypertension: Secondary | ICD-10-CM | POA: Diagnosis not present

## 2013-06-15 DIAGNOSIS — R0609 Other forms of dyspnea: Secondary | ICD-10-CM | POA: Diagnosis not present

## 2013-06-15 DIAGNOSIS — E789 Disorder of lipoprotein metabolism, unspecified: Secondary | ICD-10-CM | POA: Diagnosis not present

## 2013-06-19 ENCOUNTER — Other Ambulatory Visit (HOSPITAL_COMMUNITY): Payer: Self-pay | Admitting: Cardiovascular Disease

## 2013-06-19 ENCOUNTER — Telehealth (HOSPITAL_COMMUNITY): Payer: Self-pay | Admitting: Cardiovascular Disease

## 2013-06-19 DIAGNOSIS — I25810 Atherosclerosis of coronary artery bypass graft(s) without angina pectoris: Secondary | ICD-10-CM

## 2013-06-19 NOTE — Telephone Encounter (Signed)
LEFT MESSAGE FOR PT TO CALL AND SCHEDULE TESTING

## 2013-07-06 ENCOUNTER — Ambulatory Visit (HOSPITAL_COMMUNITY)
Admission: RE | Admit: 2013-07-06 | Discharge: 2013-07-06 | Disposition: A | Discharge status: Home / Self Care | Payer: Medicare Other | Source: Ambulatory Visit | Attending: Cardiovascular Disease | Admitting: Cardiovascular Disease

## 2013-07-06 DIAGNOSIS — I2510 Atherosclerotic heart disease of native coronary artery without angina pectoris: Secondary | ICD-10-CM | POA: Insufficient documentation

## 2013-07-06 DIAGNOSIS — I447 Left bundle-branch block, unspecified: Secondary | ICD-10-CM | POA: Diagnosis not present

## 2013-07-06 DIAGNOSIS — I10 Essential (primary) hypertension: Secondary | ICD-10-CM | POA: Insufficient documentation

## 2013-07-06 DIAGNOSIS — J449 Chronic obstructive pulmonary disease, unspecified: Secondary | ICD-10-CM | POA: Diagnosis not present

## 2013-07-06 DIAGNOSIS — R079 Chest pain, unspecified: Secondary | ICD-10-CM | POA: Diagnosis not present

## 2013-07-06 DIAGNOSIS — I739 Peripheral vascular disease, unspecified: Secondary | ICD-10-CM | POA: Insufficient documentation

## 2013-07-06 DIAGNOSIS — E119 Type 2 diabetes mellitus without complications: Secondary | ICD-10-CM | POA: Insufficient documentation

## 2013-07-06 DIAGNOSIS — R0602 Shortness of breath: Secondary | ICD-10-CM | POA: Diagnosis not present

## 2013-07-06 DIAGNOSIS — J4489 Other specified chronic obstructive pulmonary disease: Secondary | ICD-10-CM | POA: Insufficient documentation

## 2013-07-06 DIAGNOSIS — I25810 Atherosclerosis of coronary artery bypass graft(s) without angina pectoris: Secondary | ICD-10-CM

## 2013-07-06 MED ORDER — TECHNETIUM TC 99M SESTAMIBI GENERIC - CARDIOLITE
30.6000 | Freq: Once | INTRAVENOUS | Status: AC | PRN
  Administered 2013-07-06: 31 via INTRAVENOUS

## 2013-07-06 MED ORDER — REGADENOSON 0.4 MG/5ML IV SOLN
0.4000 mg | Freq: Once | INTRAVENOUS | Status: AC
  Administered 2013-07-06: 0.4 mg via INTRAVENOUS

## 2013-07-06 MED ORDER — TECHNETIUM TC 99M SESTAMIBI GENERIC - CARDIOLITE
10.9000 | Freq: Once | INTRAVENOUS | Status: AC | PRN
  Administered 2013-07-06: 10.9 via INTRAVENOUS

## 2013-07-06 NOTE — Procedures (Addendum)
[facility] [address] [place] [address] [place] [address] [phone]  Cardiology Nuclear Med Study  Rick Melendez is a 77 y.o. male     MRN : [id number]     DOB: 20-Aug-1936  Procedure Date: 07/06/2013  Nuclear Med Background Indication for Stress Test:  Graft Patency History:  COPD and CAD;CABG X3--08/2006 Cardiac Risk Factors: History of Smoking, Hypertension, LBBB, NIDDM and PVD  Symptoms:  SOB   Nuclear Pre-Procedure Caffeine/Decaff Intake:  10:00pm NPO After: 8:00am   IV Site: R Hand  IV 0.9% NS with Angio Cath:  22g  Chest Size (in):  54"  IV Started by: Emmit Pomfret, RN  Height: 5\' 6"  (1.676 m)  Cup Size: n/a  BMI:  Body mass index is 40.05 kg/(m^2). Weight:  248 lb (112.492 kg)   Tech Comments:  N/A    Nuclear Med Study 1 or 2 day study: 1 day  Stress Test Type:  Lexiscan  Order Authorizing Provider:  Nanetta Batty, MD   Resting Radionuclide: Technetium 2m Sestamibi  Resting Radionuclide Dose: 10.9 mCi   Stress Radionuclide:  Technetium 67m Sestamibi  Stress Radionuclide Dose: 30.6 mCi           Stress Protocol Rest HR: 64 Stress HR: 71  Rest BP: 130/77 Stress BP:134/73  Exercise Time (min): n/a METS: n/a          Dose of Adenosine (mg):  n/a Dose of Lexiscan: 0.4 mg  Dose of Atropine (mg): n/a Dose of Dobutamine: n/a mcg/kg/min (at max HR)  Stress Test Technologist: Ernestene Mention, CCT Nuclear Technologist: Gonzella Lex, CNMT   Rest Procedure:  Myocardial perfusion imaging was performed at rest 45 minutes following the intravenous administration of Technetium 62m Sestamibi. Stress Procedure:  The patient received IV Lexiscan 0.4 mg over 15-seconds.  Technetium 38m Sestamibi injected at 30-seconds.  There were no significant changes with Lexiscan.  Quantitative spect images were obtained after a 45 minute delay.Transient Ischemic Dilatation (Normal <1.22):  1.13 Lung/Heart Ratio (Normal <0.45):  0.29 QGS  EDV:  105 ml QGS ESV:  46 ml LV Ejection Fraction: 56%  Signed by       Rest ECG: NSR - Normal EKG  Stress ECG: No significant change from baseline ECG  QPS Raw Data Images:  Normal; no motion artifact; normal heart/lung ratio. Stress Images:  Normal homogeneous uptake in all areas of the myocardium. Rest Images:  Normal homogeneous uptake in all areas of the myocardium. Subtraction (SDS):  No evidence of ischemia.  Impression Exercise Capacity:  Lexiscan with no exercise. BP Response:  Normal blood pressure response. Clinical Symptoms:  No significant symptoms noted. ECG Impression:  No significant ST segment change suggestive of ischemia. Comparison with Prior Nuclear Study: No significant change from previous study  Overall Impression:  Normal stress nuclear study.  LV Wall Motion:  NL LV Function; NL Wall Motion   Runell Gess, MD  07/06/2013 7:06 PM

## 2013-07-10 ENCOUNTER — Telehealth: Payer: Self-pay | Admitting: *Deleted

## 2013-07-10 NOTE — Telephone Encounter (Signed)
Message copied by Marella Bile on Mon Jul 10, 2013  4:27 PM ------      Message from: Runell Gess      Created: Sun Jul 09, 2013  7:59 AM      Regarding: Rick Melendez       Call and tell normal ------

## 2013-07-11 NOTE — Telephone Encounter (Signed)
Patient notified of results of stress test

## 2013-07-26 DIAGNOSIS — R05 Cough: Secondary | ICD-10-CM | POA: Diagnosis not present

## 2013-07-26 DIAGNOSIS — J329 Chronic sinusitis, unspecified: Secondary | ICD-10-CM | POA: Diagnosis not present

## 2013-09-03 ENCOUNTER — Encounter: Payer: Self-pay | Admitting: *Deleted

## 2013-09-07 ENCOUNTER — Encounter: Payer: Self-pay | Admitting: Cardiology

## 2013-09-07 ENCOUNTER — Ambulatory Visit (INDEPENDENT_AMBULATORY_CARE_PROVIDER_SITE_OTHER): Payer: Medicare Other | Admitting: Cardiology

## 2013-09-07 VITALS — BP 116/50 | HR 68 | Ht 66.0 in | Wt 243.4 lb

## 2013-09-07 DIAGNOSIS — E119 Type 2 diabetes mellitus without complications: Secondary | ICD-10-CM | POA: Diagnosis not present

## 2013-09-07 DIAGNOSIS — I2510 Atherosclerotic heart disease of native coronary artery without angina pectoris: Secondary | ICD-10-CM

## 2013-09-07 DIAGNOSIS — I10 Essential (primary) hypertension: Secondary | ICD-10-CM

## 2013-09-07 DIAGNOSIS — G4730 Sleep apnea, unspecified: Secondary | ICD-10-CM

## 2013-09-07 DIAGNOSIS — N183 Chronic kidney disease, stage 3 unspecified: Secondary | ICD-10-CM | POA: Insufficient documentation

## 2013-09-07 DIAGNOSIS — I739 Peripheral vascular disease, unspecified: Secondary | ICD-10-CM

## 2013-09-07 DIAGNOSIS — N2889 Other specified disorders of kidney and ureter: Secondary | ICD-10-CM

## 2013-09-07 DIAGNOSIS — E785 Hyperlipidemia, unspecified: Secondary | ICD-10-CM

## 2013-09-07 MED ORDER — COQ10 200 MG PO CAPS: 200.0000 mg | ORAL_CAPSULE | Freq: Every day | ORAL | Status: DC

## 2013-09-07 NOTE — Progress Notes (Signed)
09/07/2013 Rick Melendez   01/24/1936  [id number]  Primary Physicia Michiel Sites, MD Primary Cardiologist: Dr Allyson Sabal  HPI:  The patient is a 77 year old, severely obese, married Caucasian male, father of 2, grandfather to 2 grandchildren who is followed by Dr Allyson Sabal and Dr Juleen China. He is here for a 6 month office visit.  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 He doesn't do any regular exercise but he active around the farm with chores. He has a remote history tobacco abuse, having quit 20 years ago, as well as, treated hypertension and hyperlipidemia, and diabetes.            He has CAD and  had left main 2-vessel disease by cath August 31, 2006, and underwent coronary artery bypass grafting x2 by Dr. Ofilia Neas with a LIMA to his LAD, a vein to an obtuse marginal branch and right coronary arter. He has also had stenting of his left leg back in 2003, doppler in July 2013 showed Rt iliac disease but he has been asymptomatic. He denies chest pain or shortness of breath. He does have daytime fatigue and this is unchanged. He has had some leg pain which he attributes to statin Rx.  His last Myoview was performed almost 7/12 years ago and was nonischemic. He sees Dr. Tresa Endo for followup and treatment of his sleep apnea and he reports he is compliant with this.     Current Outpatient Prescriptions  Medication Sig Dispense Refill  . albuterol (VENTOLIN HFA) 108 (90 BASE) MCG/ACT inhaler Inhale 2 puffs into the lungs as needed for wheezing.      [name] aspirin EC 81 MG tablet Take 81 mg by mouth daily.      . Choline Fenofibrate (TRILIPIX) 135 MG capsule Take 135 mg by mouth daily.      . clopidogrel (PLAVIX) 75 MG tablet Take 75 mg by mouth daily.      [name] ezetimibe (ZETIA) 10 MG tablet Take 10 mg by mouth daily.      [name] glipiZIDE (GLUCOTROL) 5 MG tablet Take 5 mg by mouth daily.      [name] lisinopril (PRINIVIL,ZESTRIL) 5 MG tablet Take 5  mg by mouth daily.      . metoprolol tartrate (LOPRESSOR) 25 MG tablet Take 25 mg by mouth daily.      . niacin (NIASPAN) 1000 MG CR tablet Take 1,000 mg by mouth at bedtime.      . Omega-3 Fatty Acids (OMEGA-3 FISH OIL) 1200 MG CAPS Take by mouth. Take 4 capsules daily      . omeprazole (PRILOSEC) 20 MG capsule Take 20 mg by mouth daily.      [name] OVER THE COUNTER MEDICATION USES CPAP NIGHTLY      . ramipril (ALTACE) 5 MG capsule Take 5 mg by mouth daily.      . rosuvastatin (CRESTOR) 40 MG tablet Take 40 mg by mouth daily.      [name] triamterene-hydrochlorothiazide (MAXZIDE-25) 37.5-25 MG per tablet Take 1 tablet by mouth daily.      . Coenzyme Q10 (COQ10) 200 MG CAPS Take 200 mg by mouth daily.  90 capsule  3   No current facility-administered medications for this visit.    No Known Allergies  History   Social History  . Marital Status: Married    Spouse Name: N/A    Number of Children: N/A  . Years of Education: N/A  Occupational History  . Not on file.   Social History Main Topics  . Smoking status: Former Smoker    Types: Cigarettes    Quit date: 09/03/1981  . Smokeless tobacco: Current User    Types: Chew  . Alcohol Use: Yes  . Drug Use: No  . Sexual Activity: Not on file   Other Topics Concern  . Not on file   Social History Narrative  . No narrative on file     Review of Systems: General: negative for chills, fever, night sweats or weight changes.  Cardiovascular: negative for chest pain, dyspnea on exertion, edema, orthopnea, palpitations, paroxysmal nocturnal dyspnea or shortness of breath Dermatological: negative for rash Respiratory: negative for cough or wheezing Urologic: negative for hematuria Abdominal: negative for nausea, vomiting, diarrhea, bright red blood per rectum, melena, or hematemesis Neurologic: negative for visual changes, syncope, or dizziness He had an exercise study at his primary care provider's office in April. He denies neuropathy or  retinopathy but does say his vision is not what it used to be. He sees an Radio producer. He admits he takes both Ibuprofen and Aleve. His SCr was 1.6 in August. All other systems reviewed and are otherwise negative except as noted above.    Blood pressure 116/50, pulse 68, height 5\' 6"  (1.676 m), weight 243 lb 6.4 oz (110.406 kg).  General appearance: alert, cooperative, no distress and morbidly obese Neck: no carotid bruit and no JVD Lungs: clear to auscultation bilaterally Heart: regular rate and rhythm  EKG NSR, poor ant RW (old)  ASSESSMENT AND PLAN:   CAD - CABG X 2 9/07 LIMA-LAD, SVG-OM. Low risk Myoview 7/12 No angina  HTN (hypertension) B/P controlled  Dyslipidemia Followed by Dr Juleen China  Diabetes mellitus Some renal insufficiency, no neuropathy, no known retinopathy  Chronic renal insufficiency, stage III (moderate)- SCr 1.6 (Aug 2013) He says he takes Naprosyn BID and Ibuprofen prn.  Sleep apnea- compliant with C-pap Daytime fatigue, no change  PVD (peripheral vascular disease)- Rt iliac disease by doppler 7/13 No claudication   PLAN  [name] seems stable from a cardiac standpoint. He is having some leg discomfort he attributes to statin Rx and I suggested he start taking CoQ10 200 mg daily. I also suggested he try and stop NSAID use with his renal insufficiency. He is to see Dr Juleen China in November and he will have labs drawn then. We will have him see Dr Allyson Sabal in 6 months or sooner if needed. His wife was present today during my interview and exam.  Stevi Hollinshead KPA-C 09/07/2013 11:14 AM

## 2013-09-07 NOTE — Assessment & Plan Note (Signed)
B/P controlled 

## 2013-09-07 NOTE — Assessment & Plan Note (Signed)
Some renal insufficiency, no neuropathy, no known retinopathy

## 2013-09-07 NOTE — Assessment & Plan Note (Signed)
He says he takes Naprosyn BID and Ibuprofen prn.

## 2013-09-07 NOTE — Assessment & Plan Note (Signed)
No angina 

## 2013-09-07 NOTE — Assessment & Plan Note (Signed)
No claudication.  ?

## 2013-09-07 NOTE — Patient Instructions (Signed)
Stop Naprosyn and Ibuprofen. Start CoQ10 200 mg daily. OK to use Tylenol as needed for pain. Your physician recommends that you schedule a follow-up appointment in: 6 months with Dr Allyson Sabal Ask Dr Juleen China to forward labs after your visit with him in November.

## 2013-09-07 NOTE — Assessment & Plan Note (Signed)
Daytime fatigue, no change

## 2013-09-07 NOTE — Assessment & Plan Note (Signed)
Followed by Dr Juleen China

## 2013-09-11 ENCOUNTER — Ambulatory Visit: Payer: PRIVATE HEALTH INSURANCE | Admitting: Cardiology

## 2013-10-05 DIAGNOSIS — H4720 Unspecified optic atrophy: Secondary | ICD-10-CM | POA: Diagnosis not present

## 2013-10-05 DIAGNOSIS — E119 Type 2 diabetes mellitus without complications: Secondary | ICD-10-CM | POA: Diagnosis not present

## 2013-10-05 DIAGNOSIS — H2510 Age-related nuclear cataract, unspecified eye: Secondary | ICD-10-CM | POA: Diagnosis not present

## 2013-10-05 DIAGNOSIS — H52209 Unspecified astigmatism, unspecified eye: Secondary | ICD-10-CM | POA: Diagnosis not present

## 2013-10-18 DIAGNOSIS — Z85828 Personal history of other malignant neoplasm of skin: Secondary | ICD-10-CM | POA: Diagnosis not present

## 2013-10-18 DIAGNOSIS — B353 Tinea pedis: Secondary | ICD-10-CM | POA: Diagnosis not present

## 2013-10-18 DIAGNOSIS — L570 Actinic keratosis: Secondary | ICD-10-CM | POA: Diagnosis not present

## 2013-10-24 DIAGNOSIS — H2510 Age-related nuclear cataract, unspecified eye: Secondary | ICD-10-CM | POA: Diagnosis not present

## 2013-10-24 DIAGNOSIS — H25019 Cortical age-related cataract, unspecified eye: Secondary | ICD-10-CM | POA: Diagnosis not present

## 2013-10-24 DIAGNOSIS — H2589 Other age-related cataract: Secondary | ICD-10-CM | POA: Diagnosis not present

## 2013-11-07 DIAGNOSIS — Z79899 Other long term (current) drug therapy: Secondary | ICD-10-CM | POA: Diagnosis not present

## 2013-11-07 DIAGNOSIS — Z125 Encounter for screening for malignant neoplasm of prostate: Secondary | ICD-10-CM | POA: Diagnosis not present

## 2013-11-07 DIAGNOSIS — E789 Disorder of lipoprotein metabolism, unspecified: Secondary | ICD-10-CM | POA: Diagnosis not present

## 2013-11-07 DIAGNOSIS — E119 Type 2 diabetes mellitus without complications: Secondary | ICD-10-CM | POA: Diagnosis not present

## 2013-11-07 DIAGNOSIS — I10 Essential (primary) hypertension: Secondary | ICD-10-CM | POA: Diagnosis not present

## 2013-11-14 DIAGNOSIS — J398 Other specified diseases of upper respiratory tract: Secondary | ICD-10-CM | POA: Diagnosis not present

## 2013-11-14 DIAGNOSIS — I2510 Atherosclerotic heart disease of native coronary artery without angina pectoris: Secondary | ICD-10-CM | POA: Diagnosis not present

## 2013-11-14 DIAGNOSIS — Z951 Presence of aortocoronary bypass graft: Secondary | ICD-10-CM | POA: Diagnosis not present

## 2013-11-14 DIAGNOSIS — I10 Essential (primary) hypertension: Secondary | ICD-10-CM | POA: Diagnosis not present

## 2013-11-14 DIAGNOSIS — E119 Type 2 diabetes mellitus without complications: Secondary | ICD-10-CM | POA: Diagnosis not present

## 2013-11-14 DIAGNOSIS — Z23 Encounter for immunization: Secondary | ICD-10-CM | POA: Diagnosis not present

## 2013-11-14 DIAGNOSIS — Z0000 Encounter for general adult medical examination without abnormal findings: Secondary | ICD-10-CM | POA: Diagnosis not present

## 2013-11-14 DIAGNOSIS — E789 Disorder of lipoprotein metabolism, unspecified: Secondary | ICD-10-CM | POA: Diagnosis not present

## 2013-11-30 DIAGNOSIS — Z23 Encounter for immunization: Secondary | ICD-10-CM | POA: Diagnosis not present

## 2014-01-30 ENCOUNTER — Other Ambulatory Visit (HOSPITAL_COMMUNITY): Payer: Self-pay | Admitting: Cardiovascular Disease

## 2014-01-30 ENCOUNTER — Encounter (HOSPITAL_COMMUNITY): Payer: Self-pay | Admitting: Cardiovascular Disease

## 2014-01-30 DIAGNOSIS — H2589 Other age-related cataract: Secondary | ICD-10-CM | POA: Diagnosis not present

## 2014-01-30 DIAGNOSIS — I739 Peripheral vascular disease, unspecified: Secondary | ICD-10-CM

## 2014-01-30 DIAGNOSIS — H2510 Age-related nuclear cataract, unspecified eye: Secondary | ICD-10-CM | POA: Diagnosis not present

## 2014-01-30 DIAGNOSIS — H25019 Cortical age-related cataract, unspecified eye: Secondary | ICD-10-CM | POA: Diagnosis not present

## 2014-03-12 ENCOUNTER — Ambulatory Visit (HOSPITAL_COMMUNITY)
Admission: RE | Admit: 2014-03-12 | Discharge: 2014-03-12 | Disposition: A | Discharge status: Home / Self Care | Payer: Medicare Other | Source: Ambulatory Visit | Attending: Cardiovascular Disease | Admitting: Cardiovascular Disease

## 2014-03-12 DIAGNOSIS — I70219 Atherosclerosis of native arteries of extremities with intermittent claudication, unspecified extremity: Secondary | ICD-10-CM | POA: Insufficient documentation

## 2014-03-12 DIAGNOSIS — I739 Peripheral vascular disease, unspecified: Secondary | ICD-10-CM

## 2014-03-12 NOTE — Progress Notes (Signed)
Arterial Duplex Lower Ext. Completed. Orvile Corona, BS, RDMS, RVT  

## 2014-03-20 ENCOUNTER — Ambulatory Visit (INDEPENDENT_AMBULATORY_CARE_PROVIDER_SITE_OTHER): Payer: Medicare Other | Admitting: Cardiovascular Disease

## 2014-03-20 ENCOUNTER — Telehealth: Payer: Self-pay | Admitting: *Deleted

## 2014-03-20 ENCOUNTER — Encounter: Payer: Self-pay | Admitting: Cardiovascular Disease

## 2014-03-20 VITALS — BP 102/50 | HR 65 | Ht 67.0 in | Wt 243.5 lb

## 2014-03-20 DIAGNOSIS — I739 Peripheral vascular disease, unspecified: Secondary | ICD-10-CM

## 2014-03-20 DIAGNOSIS — E785 Hyperlipidemia, unspecified: Secondary | ICD-10-CM

## 2014-03-20 DIAGNOSIS — I2510 Atherosclerotic heart disease of native coronary artery without angina pectoris: Secondary | ICD-10-CM

## 2014-03-20 DIAGNOSIS — I10 Essential (primary) hypertension: Secondary | ICD-10-CM

## 2014-03-20 NOTE — Patient Instructions (Signed)
Your physician wants you to follow-up in: 1 year with Dr Berry. You will receive a reminder letter in the mail two months in advance. If you don't receive a letter, please call our office to schedule the follow-up appointment.  

## 2014-03-20 NOTE — Assessment & Plan Note (Signed)
Status post coronary artery bypass grafting x3  by Dr. Ceasar Mons after cardiac catheterization performed 08/31/06 revealed left main/two-vessel disease. He had a LIMA to his LAD and a vein graft to marginal branch and right coronary artery.he had a Myoview stress test performed late last year which was nonischemic. He denies chest pain or shortness of breath.

## 2014-03-20 NOTE — Telephone Encounter (Signed)
Order placed for repeat lower extremity arterial doppler in 24 months

## 2014-03-20 NOTE — Telephone Encounter (Signed)
Message copied by Chauncy Lean on Tue Mar 20, 2014  4:53 PM ------      Message from: Lorretta Harp      Created: Sun Mar 18, 2014  1:09 PM       No change from prior study. Repeat in 24 months. ------

## 2014-03-20 NOTE — Assessment & Plan Note (Signed)
Controlled on current medications 

## 2014-03-20 NOTE — Assessment & Plan Note (Signed)
On statin therapy followed by his PCP 

## 2014-03-20 NOTE — Assessment & Plan Note (Signed)
History of left iliac stenting by Dr. Roe Rutherford back in 2002. Recent Dopplers revealed it to be widely patent. The patient denies claudication.

## 2014-03-20 NOTE — Progress Notes (Signed)
03/20/2014 Rick Melendez   1936/02/08  [id number]  Primary Physician [name], MD Primary Cardiologist: Lorretta Harp MD Renae Gloss   HPI:  The patient is a 67 [employer]ffe[employer]-year-old, severely obese, married Caucasian male, father of 2, grandfather to 2 grandchildren who is followed by Dr Gwenlyn Found and Dr Wilson Singer. He is here for a 6 month office visit. 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 He doesn't do any regular exercise but he active around the farm with chores. He has a remote history tobacco abuse, having quit 20 years ago, as well as, treated hypertension and hyperlipidemia, and diabetes.  He has CAD and had left main 2-vessel disease by cath August 31, 2006, and underwent coronary artery bypass grafting x2 by Dr. Ceasar Mons with a LIMA to his LAD, a vein to an obtuse marginal branch and right coronary arter. He has also had stenting of his left leg back in 2003, doppler in July 2013 showed Rt iliac disease but he has been asymptomatic. He denies chest pain or shortness of breath. He does have daytime fatigue and this is unchanged. He has had some leg pain which he attributes to statin Rx. His last Myoview was performed 07/06/13 was nonischemic.[name] He sees Dr. Claiborne Billings for followup and treatment of his sleep apnea and he reports he is compliant with this.    Current Outpatient Prescriptions  Medication Sig Dispense Refill  . albuterol (VENTOLIN HFA) 108 (90 BASE) MCG/ACT inhaler Inhale 2 puffs into the lungs as needed for wheezing.      [name] aspirin EC 81 MG tablet Take 81 mg by mouth daily.      . Choline Fenofibrate (TRILIPIX) 135 MG capsule Take 135 mg by mouth daily.      . clopidogrel (PLAVIX) 75 MG tablet Take 75 mg by mouth daily.      . Coenzyme Q10 (COQ10) 200 MG CAPS Take 200 mg by mouth daily.  90 capsule  3  . ezetimibe (ZETIA) 10 MG tablet Take 10 mg by mouth daily.      [name] glipiZIDE  (GLUCOTROL) 5 MG tablet Take 5 mg by mouth daily.      [name] lisinopril (PRINIVIL,ZESTRIL) 20 MG tablet Take 20 mg by mouth daily.      . metoprolol tartrate (LOPRESSOR) 25 MG tablet Take 25 mg by mouth daily.      . naproxen (NAPROSYN) 500 MG tablet Take 500 mg by mouth 2 (two) times daily with a meal.      . niacin (NIASPAN) 1000 MG CR tablet Take 750 mg by mouth 2 (two) times daily.       . Omega-3 Fatty Acids (OMEGA-3 FISH OIL) 1200 MG CAPS Take by mouth. Take 4 capsules daily      . omeprazole (PRILOSEC) 20 MG capsule Take 20 mg by mouth daily.      . rosuvastatin (CRESTOR) 40 MG tablet Take 40 mg by mouth daily.      [name] triamterene-hydrochlorothiazide (MAXZIDE-25) 37.5-25 MG per tablet Take 1 tablet by mouth daily.      [name] OVER THE COUNTER MEDICATION USES CPAP NIGHTLY       No current facility-administered medications for this visit.    No Known Allergies  History   Social History  . Marital Status: Married    Spouse Name: N/A    Number of Children: N/A  . Years of Education: N/A  Occupational History  . Not on file.   Social History Main Topics  . Smoking status: Former Smoker    Types: Cigarettes    Quit date: 09/03/1981  . Smokeless tobacco: Current User    Types: Chew  . Alcohol Use: Yes  . Drug Use: No  . Sexual Activity: Not on file   Other Topics Concern  . Not on file   Social History Narrative  . No narrative on file     Review of Systems: General: negative for chills, fever, night sweats or weight changes.  Cardiovascular: negative for chest pain, dyspnea on exertion, edema, orthopnea, palpitations, paroxysmal nocturnal dyspnea or shortness of breath Dermatological: negative for rash Respiratory: negative for cough or wheezing Urologic: negative for hematuria Abdominal: negative for nausea, vomiting, diarrhea, bright red blood per rectum, melena, or hematemesis Neurologic: negative for visual changes, syncope, or dizziness All other systems reviewed and  are otherwise negative except as noted above.    Blood pressure 102/50, pulse 65, height 5\' 7"  (1.702 m), weight 243 lb 8 oz (110.451 kg).  General appearance: alert and no distress Neck: no adenopathy, no carotid bruit, no JVD, supple, symmetrical, trachea midline and thyroid not enlarged, symmetric, no tenderness/mass/nodules Lungs: clear to auscultation bilaterally Heart: regular rate and rhythm, S1, S2 normal, no murmur, click, rub or gallop Extremities: extremities normal, atraumatic, no cyanosis or edema  EKG normal sinus rhythm at 65 with septal Q waves  ASSESSMENT AND PLAN:   CAD - CABG X 2 9/07 LIMA-LAD, SVG-OM. Low risk Myoview 7/12 Status post coronary artery bypass grafting x3  by Dr. Ceasar Mons after cardiac catheterization performed 08/31/06 revealed left main/two-vessel disease. He had a LIMA to his LAD and a vein graft to marginal branch and right coronary artery.he had a Myoview stress test performed late last year which was nonischemic. He denies chest pain or shortness of breath.  Dyslipidemia On statin therapy followed by his PCP  HTN (hypertension) Controlled on current medications  PVD (peripheral vascular disease)- Rt iliac disease by doppler 7/13 History of left iliac stenting by Dr. Roe Rutherford back in 2002. Recent Dopplers revealed it to be widely patent. The patient denies claudication.      Lorretta Harp MD FACP,FACC,FAHA, [facility] 03/20/2014 11:32 AM

## 2014-04-11 DIAGNOSIS — H1030 Unspecified acute conjunctivitis, unspecified eye: Secondary | ICD-10-CM | POA: Diagnosis not present

## 2014-04-19 DIAGNOSIS — M1710 Unilateral primary osteoarthritis, unspecified knee: Secondary | ICD-10-CM | POA: Diagnosis not present

## 2014-04-19 DIAGNOSIS — M5137 Other intervertebral disc degeneration, lumbosacral region: Secondary | ICD-10-CM | POA: Diagnosis not present

## 2014-05-09 DIAGNOSIS — E119 Type 2 diabetes mellitus without complications: Secondary | ICD-10-CM | POA: Diagnosis not present

## 2014-05-09 DIAGNOSIS — E789 Disorder of lipoprotein metabolism, unspecified: Secondary | ICD-10-CM | POA: Diagnosis not present

## 2014-05-16 DIAGNOSIS — IMO0002 Reserved for concepts with insufficient information to code with codable children: Secondary | ICD-10-CM | POA: Diagnosis not present

## 2014-05-16 DIAGNOSIS — E789 Disorder of lipoprotein metabolism, unspecified: Secondary | ICD-10-CM | POA: Diagnosis not present

## 2014-05-16 DIAGNOSIS — E875 Hyperkalemia: Secondary | ICD-10-CM | POA: Diagnosis not present

## 2014-05-16 DIAGNOSIS — M5137 Other intervertebral disc degeneration, lumbosacral region: Secondary | ICD-10-CM | POA: Diagnosis not present

## 2014-05-16 DIAGNOSIS — E119 Type 2 diabetes mellitus without complications: Secondary | ICD-10-CM | POA: Diagnosis not present

## 2014-05-17 ENCOUNTER — Other Ambulatory Visit: Payer: Self-pay | Admitting: Orthopedic Surgery

## 2014-05-17 DIAGNOSIS — M545 Low back pain, unspecified: Secondary | ICD-10-CM

## 2014-05-27 ENCOUNTER — Ambulatory Visit
Admission: RE | Admit: 2014-05-27 | Discharge: 2014-05-27 | Disposition: A | Discharge status: Home / Self Care | Payer: Medicare Other | Source: Ambulatory Visit | Attending: Orthopedic Surgery | Admitting: Orthopedic Surgery

## 2014-05-27 DIAGNOSIS — M545 Low back pain, unspecified: Secondary | ICD-10-CM

## 2014-05-27 DIAGNOSIS — M4310 Spondylolisthesis, site unspecified: Secondary | ICD-10-CM | POA: Diagnosis not present

## 2014-05-27 DIAGNOSIS — M47817 Spondylosis without myelopathy or radiculopathy, lumbosacral region: Secondary | ICD-10-CM | POA: Diagnosis not present

## 2014-05-27 DIAGNOSIS — M5126 Other intervertebral disc displacement, lumbar region: Secondary | ICD-10-CM | POA: Diagnosis not present

## 2014-05-27 DIAGNOSIS — M48061 Spinal stenosis, lumbar region without neurogenic claudication: Secondary | ICD-10-CM | POA: Diagnosis not present

## 2014-05-29 DIAGNOSIS — J019 Acute sinusitis, unspecified: Secondary | ICD-10-CM | POA: Diagnosis not present

## 2014-05-31 DIAGNOSIS — M48061 Spinal stenosis, lumbar region without neurogenic claudication: Secondary | ICD-10-CM | POA: Diagnosis not present

## 2014-05-31 DIAGNOSIS — IMO0002 Reserved for concepts with insufficient information to code with codable children: Secondary | ICD-10-CM | POA: Diagnosis not present

## 2014-06-11 DIAGNOSIS — E119 Type 2 diabetes mellitus without complications: Secondary | ICD-10-CM | POA: Diagnosis not present

## 2014-06-18 ENCOUNTER — Telehealth: Payer: Self-pay | Admitting: *Deleted

## 2014-06-18 DIAGNOSIS — N289 Disorder of kidney and ureter, unspecified: Secondary | ICD-10-CM | POA: Diagnosis not present

## 2014-06-18 DIAGNOSIS — E119 Type 2 diabetes mellitus without complications: Secondary | ICD-10-CM | POA: Diagnosis not present

## 2014-06-18 DIAGNOSIS — I10 Essential (primary) hypertension: Secondary | ICD-10-CM | POA: Diagnosis not present

## 2014-06-18 NOTE — Telephone Encounter (Signed)
Dr Gwenlyn Found reviewed the chart and gave permission to hold Plavix prior to the procedure. Form faxed back to [facility]

## 2014-06-21 ENCOUNTER — Other Ambulatory Visit: Payer: Self-pay | Admitting: Endocrinology

## 2014-06-21 DIAGNOSIS — L570 Actinic keratosis: Secondary | ICD-10-CM | POA: Diagnosis not present

## 2014-06-21 DIAGNOSIS — L821 Other seborrheic keratosis: Secondary | ICD-10-CM | POA: Diagnosis not present

## 2014-06-21 DIAGNOSIS — N289 Disorder of kidney and ureter, unspecified: Secondary | ICD-10-CM

## 2014-06-21 DIAGNOSIS — Z85828 Personal history of other malignant neoplasm of skin: Secondary | ICD-10-CM | POA: Diagnosis not present

## 2014-06-21 DIAGNOSIS — L723 Sebaceous cyst: Secondary | ICD-10-CM | POA: Diagnosis not present

## 2014-06-28 DIAGNOSIS — IMO0002 Reserved for concepts with insufficient information to code with codable children: Secondary | ICD-10-CM | POA: Diagnosis not present

## 2014-06-28 DIAGNOSIS — M48061 Spinal stenosis, lumbar region without neurogenic claudication: Secondary | ICD-10-CM | POA: Diagnosis not present

## 2014-07-05 ENCOUNTER — Ambulatory Visit
Admission: RE | Admit: 2014-07-05 | Discharge: 2014-07-05 | Disposition: A | Discharge status: Home / Self Care | Payer: Medicare Other | Source: Ambulatory Visit | Attending: Endocrinology | Admitting: Endocrinology

## 2014-07-05 DIAGNOSIS — N289 Disorder of kidney and ureter, unspecified: Secondary | ICD-10-CM | POA: Diagnosis not present

## 2014-07-05 DIAGNOSIS — N3289 Other specified disorders of bladder: Secondary | ICD-10-CM | POA: Diagnosis not present

## 2014-08-01 DIAGNOSIS — N183 Chronic kidney disease, stage 3 unspecified: Secondary | ICD-10-CM | POA: Diagnosis not present

## 2014-09-11 DIAGNOSIS — E119 Type 2 diabetes mellitus without complications: Secondary | ICD-10-CM | POA: Diagnosis not present

## 2014-09-12 ENCOUNTER — Telehealth: Payer: Self-pay | Admitting: *Deleted

## 2014-09-12 NOTE — Telephone Encounter (Signed)
Dr Gwenlyn Found reviewed the chart and gave authorization to hold Plavix prior to injection.  Form signed and faxed back to [facility]

## 2014-09-18 DIAGNOSIS — E119 Type 2 diabetes mellitus without complications: Secondary | ICD-10-CM | POA: Diagnosis not present

## 2014-09-18 DIAGNOSIS — I10 Essential (primary) hypertension: Secondary | ICD-10-CM | POA: Diagnosis not present

## 2014-09-18 DIAGNOSIS — Z23 Encounter for immunization: Secondary | ICD-10-CM | POA: Diagnosis not present

## 2014-09-18 DIAGNOSIS — E789 Disorder of lipoprotein metabolism, unspecified: Secondary | ICD-10-CM | POA: Diagnosis not present

## 2014-09-20 DIAGNOSIS — M47817 Spondylosis without myelopathy or radiculopathy, lumbosacral region: Secondary | ICD-10-CM | POA: Diagnosis not present

## 2014-09-20 DIAGNOSIS — M545 Low back pain: Secondary | ICD-10-CM | POA: Diagnosis not present

## 2014-10-25 DIAGNOSIS — E119 Type 2 diabetes mellitus without complications: Secondary | ICD-10-CM | POA: Diagnosis not present

## 2014-10-25 DIAGNOSIS — I10 Essential (primary) hypertension: Secondary | ICD-10-CM | POA: Diagnosis not present

## 2014-10-25 DIAGNOSIS — R809 Proteinuria, unspecified: Secondary | ICD-10-CM | POA: Diagnosis not present

## 2014-10-25 DIAGNOSIS — N183 Chronic kidney disease, stage 3 (moderate): Secondary | ICD-10-CM | POA: Diagnosis not present

## 2014-11-22 DIAGNOSIS — E118 Type 2 diabetes mellitus with unspecified complications: Secondary | ICD-10-CM | POA: Diagnosis not present

## 2014-11-22 DIAGNOSIS — E789 Disorder of lipoprotein metabolism, unspecified: Secondary | ICD-10-CM | POA: Diagnosis not present

## 2014-11-22 DIAGNOSIS — Z125 Encounter for screening for malignant neoplasm of prostate: Secondary | ICD-10-CM | POA: Diagnosis not present

## 2014-11-22 DIAGNOSIS — Z79899 Other long term (current) drug therapy: Secondary | ICD-10-CM | POA: Diagnosis not present

## 2014-11-22 DIAGNOSIS — N189 Chronic kidney disease, unspecified: Secondary | ICD-10-CM | POA: Diagnosis not present

## 2014-11-29 DIAGNOSIS — E789 Disorder of lipoprotein metabolism, unspecified: Secondary | ICD-10-CM | POA: Diagnosis not present

## 2014-11-29 DIAGNOSIS — I10 Essential (primary) hypertension: Secondary | ICD-10-CM | POA: Diagnosis not present

## 2014-11-29 DIAGNOSIS — I2510 Atherosclerotic heart disease of native coronary artery without angina pectoris: Secondary | ICD-10-CM | POA: Diagnosis not present

## 2014-11-29 DIAGNOSIS — E118 Type 2 diabetes mellitus with unspecified complications: Secondary | ICD-10-CM | POA: Diagnosis not present

## 2014-12-25 DIAGNOSIS — L723 Sebaceous cyst: Secondary | ICD-10-CM | POA: Diagnosis not present

## 2014-12-25 DIAGNOSIS — B353 Tinea pedis: Secondary | ICD-10-CM | POA: Diagnosis not present

## 2014-12-25 DIAGNOSIS — L570 Actinic keratosis: Secondary | ICD-10-CM | POA: Diagnosis not present

## 2014-12-25 DIAGNOSIS — Z85828 Personal history of other malignant neoplasm of skin: Secondary | ICD-10-CM | POA: Diagnosis not present

## 2014-12-25 DIAGNOSIS — L821 Other seborrheic keratosis: Secondary | ICD-10-CM | POA: Diagnosis not present

## 2014-12-25 DIAGNOSIS — L720 Epidermal cyst: Secondary | ICD-10-CM | POA: Diagnosis not present

## 2015-01-07 DIAGNOSIS — H903 Sensorineural hearing loss, bilateral: Secondary | ICD-10-CM | POA: Diagnosis not present

## 2015-01-17 ENCOUNTER — Telehealth: Payer: Self-pay | Admitting: Internal Medicine

## 2015-01-17 NOTE — Telephone Encounter (Signed)
Pt has consult with [facility]; would like records shredded.

## 2015-01-22 ENCOUNTER — Telehealth: Payer: Self-pay | Admitting: *Deleted

## 2015-01-22 DIAGNOSIS — I2510 Atherosclerotic heart disease of native coronary artery without angina pectoris: Secondary | ICD-10-CM | POA: Diagnosis not present

## 2015-01-22 DIAGNOSIS — Z86010 Personal history of colonic polyps: Secondary | ICD-10-CM | POA: Diagnosis not present

## 2015-01-22 DIAGNOSIS — K219 Gastro-esophageal reflux disease without esophagitis: Secondary | ICD-10-CM | POA: Diagnosis not present

## 2015-01-22 NOTE — Telephone Encounter (Signed)
Clearance faxed to [facility] 

## 2015-01-22 NOTE — Telephone Encounter (Signed)
Okay to stop his Plavix prior to endoscopy

## 2015-01-22 NOTE — Telephone Encounter (Signed)
Dr. Oletta Lamas from [place] would like to know if patient can hold Plavix 3 days prior to his colonoscopy scheduled for 02/14/2015.

## 2015-01-29 ENCOUNTER — Telehealth: Payer: Self-pay | Admitting: Cardiovascular Disease

## 2015-01-29 NOTE — Telephone Encounter (Signed)
Received records from [facility] (Dr Laurence Spates) for appointment on 03/05/15 with Dr Gwenlyn Found.  Records given to [facility] (medical records) for Dr Kennon Holter schedule on 03/05/15.  lp

## 2015-02-14 DIAGNOSIS — D126 Benign neoplasm of colon, unspecified: Secondary | ICD-10-CM | POA: Diagnosis not present

## 2015-02-14 DIAGNOSIS — Z86010 Personal history of colonic polyps: Secondary | ICD-10-CM | POA: Diagnosis not present

## 2015-02-14 DIAGNOSIS — D122 Benign neoplasm of ascending colon: Secondary | ICD-10-CM | POA: Diagnosis not present

## 2015-02-14 DIAGNOSIS — Z09 Encounter for follow-up examination after completed treatment for conditions other than malignant neoplasm: Secondary | ICD-10-CM | POA: Diagnosis not present

## 2015-02-14 DIAGNOSIS — K648 Other hemorrhoids: Secondary | ICD-10-CM | POA: Diagnosis not present

## 2015-02-25 ENCOUNTER — Encounter: Payer: Self-pay | Admitting: Cardiovascular Disease

## 2015-02-25 ENCOUNTER — Telehealth: Payer: Self-pay | Admitting: Cardiovascular Disease

## 2015-02-28 DIAGNOSIS — E118 Type 2 diabetes mellitus with unspecified complications: Secondary | ICD-10-CM | POA: Diagnosis not present

## 2015-02-28 DIAGNOSIS — E789 Disorder of lipoprotein metabolism, unspecified: Secondary | ICD-10-CM | POA: Diagnosis not present

## 2015-02-28 NOTE — Telephone Encounter (Signed)
Closed encounter °

## 2015-03-05 ENCOUNTER — Ambulatory Visit: Payer: Medicare Other | Admitting: Cardiovascular Disease

## 2015-03-07 DIAGNOSIS — E789 Disorder of lipoprotein metabolism, unspecified: Secondary | ICD-10-CM | POA: Diagnosis not present

## 2015-03-07 DIAGNOSIS — E118 Type 2 diabetes mellitus with unspecified complications: Secondary | ICD-10-CM | POA: Diagnosis not present

## 2015-03-07 DIAGNOSIS — E119 Type 2 diabetes mellitus without complications: Secondary | ICD-10-CM | POA: Diagnosis not present

## 2015-03-07 DIAGNOSIS — Z961 Presence of intraocular lens: Secondary | ICD-10-CM | POA: Diagnosis not present

## 2015-03-07 DIAGNOSIS — M549 Dorsalgia, unspecified: Secondary | ICD-10-CM | POA: Diagnosis not present

## 2015-03-29 DIAGNOSIS — E118 Type 2 diabetes mellitus with unspecified complications: Secondary | ICD-10-CM | POA: Diagnosis not present

## 2015-03-29 DIAGNOSIS — E789 Disorder of lipoprotein metabolism, unspecified: Secondary | ICD-10-CM | POA: Diagnosis not present

## 2015-04-03 ENCOUNTER — Ambulatory Visit (INDEPENDENT_AMBULATORY_CARE_PROVIDER_SITE_OTHER): Payer: Medicare Other | Admitting: Cardiovascular Disease

## 2015-04-03 ENCOUNTER — Encounter: Payer: Self-pay | Admitting: Cardiovascular Disease

## 2015-04-03 VITALS — BP 110/54 | HR 63 | Ht 67.0 in | Wt 229.0 lb

## 2015-04-03 DIAGNOSIS — I10 Essential (primary) hypertension: Secondary | ICD-10-CM | POA: Diagnosis not present

## 2015-04-03 DIAGNOSIS — I2583 Coronary atherosclerosis due to lipid rich plaque: Principal | ICD-10-CM

## 2015-04-03 DIAGNOSIS — I2510 Atherosclerotic heart disease of native coronary artery without angina pectoris: Secondary | ICD-10-CM | POA: Diagnosis not present

## 2015-04-03 DIAGNOSIS — I739 Peripheral vascular disease, unspecified: Secondary | ICD-10-CM | POA: Diagnosis not present

## 2015-04-03 DIAGNOSIS — E789 Disorder of lipoprotein metabolism, unspecified: Secondary | ICD-10-CM | POA: Diagnosis not present

## 2015-04-03 NOTE — Assessment & Plan Note (Signed)
History of obstructive sleep apnea on C Pap. He is followed by Dr. Claiborne Billings

## 2015-04-03 NOTE — Progress Notes (Signed)
04/03/2015 Rick Melendez   13-Sep-1936  [id number]  Primary Physician Rick Bolt, MD Primary Cardiologist: Rick Harp MD Renae Gloss   HPI:   The patient is a 79 year old, severely obese, married Caucasian male, father of 2, grandfather to 2 grandchildren who is followed by Dr Rick Melendez and Dr Rick Melendez. I last saw him one year ago. 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 He doesn't do any regular exercise but he active around the farm with chores. He has a remote history tobacco abuse, having quit 20 years ago, as well as, treated hypertension and hyperlipidemia, and diabetes.  He has CAD and had left main 2-vessel disease by cath August 31, 2006, and underwent coronary artery bypass grafting x2 by Dr. Ceasar Melendez with a LIMA to his LAD, a vein to an obtuse marginal branch and right coronary arter. He has also had stenting of his left leg back in 2003, doppler in July 2013 showed Rt iliac disease but he has been asymptomatic. He denies chest pain or shortness of breath. He does have daytime fatigue and this is unchanged. He has had some leg pain which he attributes to statin Rx. His last Myoview was performed 07/06/13 was nonischemic.[name] He sees Dr. [name] for followup and treatment of his sleep apnea and he reports he is compliant with this. Since I saw him one year ago he remains fairly stable and denies chest pain or shortness of breath.    Current Outpatient Prescriptions  Medication Sig Dispense Refill  . albuterol (VENTOLIN HFA) 108 (90 BASE) MCG/ACT inhaler Inhale 2 puffs into the lungs as needed for wheezing.    [name] aspirin EC 81 MG tablet Take 81 mg by mouth daily.    . clopidogrel (PLAVIX) 75 MG tablet Take 75 mg by mouth daily.    . Coenzyme Q10 (COQ10) 200 MG CAPS Take 200 mg by mouth daily. 90 capsule 3  . ezetimibe (ZETIA) 10 MG tablet Take 10 mg by mouth daily.    [name] glipiZIDE (GLUCOTROL) 5  MG tablet Take 5 mg by mouth daily.    [name] HYDROcodone-acetaminophen (NORCO/VICODIN) 5-325 MG per tablet Take 1 tablet by mouth 2 (two) times daily.  0  . lisinopril (PRINIVIL,ZESTRIL) 20 MG tablet Take 20 mg by mouth daily.    . metoprolol tartrate (LOPRESSOR) 25 MG tablet Take 25 mg by mouth daily.    . Omega-3 Fatty Acids (OMEGA-3 FISH OIL) 1200 MG CAPS Take by mouth. Take 4 capsules daily    . omeprazole (PRILOSEC) 20 MG capsule Take 20 mg by mouth daily.    [name] OVER THE COUNTER MEDICATION USES CPAP NIGHTLY    . triamterene-hydrochlorothiazide (MAXZIDE-25) 37.5-25 MG per tablet Take 1 tablet by mouth daily.     No current facility-administered medications for this visit.    No Known Allergies  History   Social History  . Marital Status: Married    Spouse Name: N/A  . Number of Children: N/A  . Years of Education: N/A   Occupational History  . Not on file.   Social History Main Topics  . Smoking status: Former Smoker    Types: Cigarettes    Quit date: 09/03/1981  . Smokeless tobacco: Current User    Types: Chew  . Alcohol Use: Yes  . Drug Use: No  . Sexual Activity: Not on file   Other Topics Concern  . Not on  file   Social History Narrative     Review of Systems: General: negative for chills, fever, night sweats or weight changes.  Cardiovascular: negative for chest pain, dyspnea on exertion, edema, orthopnea, palpitations, paroxysmal nocturnal dyspnea or shortness of breath Dermatological: negative for rash Respiratory: negative for cough or wheezing Urologic: negative for hematuria Abdominal: negative for nausea, vomiting, diarrhea, bright red blood per rectum, melena, or hematemesis Neurologic: negative for visual changes, syncope, or dizziness All other systems reviewed and are otherwise negative except as noted above.    Blood pressure 110/54, pulse 63, height 5\' 7"  (1.702 m), weight 229 lb (103.874 kg).  General appearance: alert and no distress Neck: no  adenopathy, no carotid bruit, no JVD, supple, symmetrical, trachea midline and thyroid not enlarged, symmetric, no tenderness/mass/nodules Lungs: clear to auscultation bilaterally Heart: regular rate and rhythm, S1, S2 normal, no murmur, click, rub or gallop Extremities: extremities normal, atraumatic, no cyanosis or edema  EKG normal sinus rhythm at 63 with nonspecific ST-T wave changes, septal Q waves. I personally reviewed this EKG  ASSESSMENT AND PLAN:   Sleep apnea- compliant with C-pap History of obstructive sleep apnea on C Pap. He is followed by Dr. [name]   PVD (peripheral vascular disease)- Rt iliac disease by doppler 7/13 History of peripheral vascular disease with history of stenting of his left leg back in 2003. His last lower extremity arterial Doppler studies performed one year ago revealed normal ABIs with mild to moderately increased velocities in his iliac arteries bilaterally. He does complain of some calf claudication. I am going to get lower extremity arterial Doppler studies.   HTN (hypertension) History of hypertension blood pressure measured at 110/54. He is on Maxzide and lisinopril as well as metoprolol. Continue current meds at current dosing   Dyslipidemia History of hyperlipidemia on Zetia. He is apparently statin intolerant. This is followed by his PCP.   CAD - CABG X 2 9/07 LIMA-LAD, SVG-OM. Low risk Myoview 7/12 History of coronary artery disease status post coronary bypass grafting by Dr. Servando Melendez with a LIMA to his LAD and a vein to an obtuse marginal branch as well as the right coronary artery after cath performed 08/31/06 revealed left main/2 vessel disease. He denies chest pain or shortness of breath. His last Myoview performed 07/06/13 was nonischemic.       Rick Harp MD Rick Melendez, [facility] 04/03/2015 12:45 PM Rick Harp MD FACP,FACC,FAHA, [facility]

## 2015-04-03 NOTE — Assessment & Plan Note (Signed)
History of coronary artery disease status post coronary bypass grafting by Dr. Servando Snare with a LIMA to his LAD and a vein to an obtuse marginal branch as well as the right coronary artery after cath performed 08/31/06 revealed left main/2 vessel disease. He denies chest pain or shortness of breath. His last Myoview performed 07/06/13 was nonischemic.

## 2015-04-03 NOTE — Patient Instructions (Signed)
  We will see you back in follow up in 1 year with Dr Gwenlyn Found.   Dr Gwenlyn Found has ordered: 1. Lower extremity arterial doppler- During this test, ultrasound is used to evaluate arterial blood flow in the legs. Allow approximately one hour for this exam.

## 2015-04-03 NOTE — Assessment & Plan Note (Signed)
History of peripheral vascular disease with history of stenting of his left leg back in 2003. His last lower extremity arterial Doppler studies performed one year ago revealed normal ABIs with mild to moderately increased velocities in his iliac arteries bilaterally. He does complain of some calf claudication. I am going to get lower extremity arterial Doppler studies.

## 2015-04-03 NOTE — Assessment & Plan Note (Signed)
History of hypertension blood pressure measured at 110/54. He is on Maxzide and lisinopril as well as metoprolol. Continue current meds at current dosing

## 2015-04-03 NOTE — Assessment & Plan Note (Signed)
History of hyperlipidemia on Zetia. He is apparently statin intolerant. This is followed by his PCP.

## 2015-04-10 DIAGNOSIS — M1711 Unilateral primary osteoarthritis, right knee: Secondary | ICD-10-CM | POA: Diagnosis not present

## 2015-04-10 DIAGNOSIS — M1712 Unilateral primary osteoarthritis, left knee: Secondary | ICD-10-CM | POA: Diagnosis not present

## 2015-04-16 ENCOUNTER — Ambulatory Visit (HOSPITAL_COMMUNITY)
Admission: RE | Admit: 2015-04-16 | Discharge: 2015-04-16 | Disposition: A | Discharge status: Home / Self Care | Payer: Medicare Other | Source: Ambulatory Visit | Attending: Cardiology | Admitting: Cardiology

## 2015-04-16 DIAGNOSIS — I739 Peripheral vascular disease, unspecified: Secondary | ICD-10-CM | POA: Insufficient documentation

## 2015-04-16 DIAGNOSIS — I10 Essential (primary) hypertension: Secondary | ICD-10-CM

## 2015-04-16 NOTE — Progress Notes (Signed)
Lower extremity arterial duplex completed. °Brianna L Mazza,RVT °

## 2015-04-22 ENCOUNTER — Telehealth: Payer: Self-pay | Admitting: Cardiovascular Disease

## 2015-04-22 NOTE — Telephone Encounter (Signed)
Patient notified of doppler results

## 2015-04-22 NOTE — Telephone Encounter (Signed)
lmom 

## 2015-04-22 NOTE — Telephone Encounter (Signed)
Returning your call. °

## 2015-05-02 DIAGNOSIS — M545 Low back pain: Secondary | ICD-10-CM | POA: Diagnosis not present

## 2015-05-02 DIAGNOSIS — M47816 Spondylosis without myelopathy or radiculopathy, lumbar region: Secondary | ICD-10-CM | POA: Diagnosis not present

## 2015-06-25 DIAGNOSIS — E118 Type 2 diabetes mellitus with unspecified complications: Secondary | ICD-10-CM | POA: Diagnosis not present

## 2015-06-25 DIAGNOSIS — I10 Essential (primary) hypertension: Secondary | ICD-10-CM | POA: Diagnosis not present

## 2015-06-25 DIAGNOSIS — E789 Disorder of lipoprotein metabolism, unspecified: Secondary | ICD-10-CM | POA: Diagnosis not present

## 2015-07-01 DIAGNOSIS — T1502XA Foreign body in cornea, left eye, initial encounter: Secondary | ICD-10-CM | POA: Diagnosis not present

## 2015-07-02 DIAGNOSIS — L723 Sebaceous cyst: Secondary | ICD-10-CM | POA: Diagnosis not present

## 2015-07-02 DIAGNOSIS — E118 Type 2 diabetes mellitus with unspecified complications: Secondary | ICD-10-CM | POA: Diagnosis not present

## 2015-07-02 DIAGNOSIS — Z85828 Personal history of other malignant neoplasm of skin: Secondary | ICD-10-CM | POA: Diagnosis not present

## 2015-07-02 DIAGNOSIS — L821 Other seborrheic keratosis: Secondary | ICD-10-CM | POA: Diagnosis not present

## 2015-07-02 DIAGNOSIS — C44311 Basal cell carcinoma of skin of nose: Secondary | ICD-10-CM | POA: Diagnosis not present

## 2015-07-02 DIAGNOSIS — E039 Hypothyroidism, unspecified: Secondary | ICD-10-CM | POA: Diagnosis not present

## 2015-07-02 DIAGNOSIS — I10 Essential (primary) hypertension: Secondary | ICD-10-CM | POA: Diagnosis not present

## 2015-07-02 DIAGNOSIS — L570 Actinic keratosis: Secondary | ICD-10-CM | POA: Diagnosis not present

## 2015-07-02 DIAGNOSIS — D485 Neoplasm of uncertain behavior of skin: Secondary | ICD-10-CM | POA: Diagnosis not present

## 2015-07-08 DIAGNOSIS — T1502XD Foreign body in cornea, left eye, subsequent encounter: Secondary | ICD-10-CM | POA: Diagnosis not present

## 2015-07-10 ENCOUNTER — Encounter: Payer: Self-pay | Admitting: Cardiovascular Disease

## 2015-07-18 DIAGNOSIS — H04122 Dry eye syndrome of left lacrimal gland: Secondary | ICD-10-CM | POA: Diagnosis not present

## 2015-07-22 ENCOUNTER — Encounter: Payer: Self-pay | Admitting: Cardiovascular Disease

## 2015-08-01 DIAGNOSIS — I10 Essential (primary) hypertension: Secondary | ICD-10-CM | POA: Diagnosis not present

## 2015-08-01 DIAGNOSIS — N183 Chronic kidney disease, stage 3 (moderate): Secondary | ICD-10-CM | POA: Diagnosis not present

## 2015-08-01 DIAGNOSIS — R809 Proteinuria, unspecified: Secondary | ICD-10-CM | POA: Diagnosis not present

## 2015-10-04 DIAGNOSIS — Z23 Encounter for immunization: Secondary | ICD-10-CM | POA: Diagnosis not present

## 2015-10-08 DIAGNOSIS — M170 Bilateral primary osteoarthritis of knee: Secondary | ICD-10-CM | POA: Diagnosis not present

## 2015-10-22 DIAGNOSIS — M47816 Spondylosis without myelopathy or radiculopathy, lumbar region: Secondary | ICD-10-CM | POA: Diagnosis not present

## 2015-11-26 DIAGNOSIS — I10 Essential (primary) hypertension: Secondary | ICD-10-CM | POA: Diagnosis not present

## 2015-11-26 DIAGNOSIS — E789 Disorder of lipoprotein metabolism, unspecified: Secondary | ICD-10-CM | POA: Diagnosis not present

## 2015-11-26 DIAGNOSIS — Z125 Encounter for screening for malignant neoplasm of prostate: Secondary | ICD-10-CM | POA: Diagnosis not present

## 2015-11-26 DIAGNOSIS — Z0000 Encounter for general adult medical examination without abnormal findings: Secondary | ICD-10-CM | POA: Diagnosis not present

## 2015-11-26 DIAGNOSIS — E118 Type 2 diabetes mellitus with unspecified complications: Secondary | ICD-10-CM | POA: Diagnosis not present

## 2015-12-03 DIAGNOSIS — E789 Disorder of lipoprotein metabolism, unspecified: Secondary | ICD-10-CM | POA: Diagnosis not present

## 2015-12-03 DIAGNOSIS — I10 Essential (primary) hypertension: Secondary | ICD-10-CM | POA: Diagnosis not present

## 2015-12-03 DIAGNOSIS — E118 Type 2 diabetes mellitus with unspecified complications: Secondary | ICD-10-CM | POA: Diagnosis not present

## 2015-12-09 DIAGNOSIS — J343 Hypertrophy of nasal turbinates: Secondary | ICD-10-CM | POA: Diagnosis not present

## 2015-12-09 DIAGNOSIS — J310 Chronic rhinitis: Secondary | ICD-10-CM | POA: Diagnosis not present

## 2015-12-09 DIAGNOSIS — J342 Deviated nasal septum: Secondary | ICD-10-CM | POA: Diagnosis not present

## 2016-01-01 ENCOUNTER — Telehealth: Payer: Self-pay | Admitting: Cardiovascular Disease

## 2016-01-01 NOTE — Telephone Encounter (Signed)
Called patient to Schedule appointment tomorrow. FTF needed.

## 2016-01-01 NOTE — Telephone Encounter (Signed)
Pt needs a prescription for his C Pap equipment.This needs to go [place].

## 2016-01-02 ENCOUNTER — Encounter: Payer: Self-pay | Admitting: Cardiovascular Disease

## 2016-01-02 ENCOUNTER — Ambulatory Visit (INDEPENDENT_AMBULATORY_CARE_PROVIDER_SITE_OTHER): Payer: Commercial Managed Care - HMO | Admitting: Cardiovascular Disease

## 2016-01-02 VITALS — BP 104/58 | HR 73 | Ht 67.0 in | Wt 221.6 lb

## 2016-01-02 DIAGNOSIS — G4730 Sleep apnea, unspecified: Secondary | ICD-10-CM

## 2016-01-02 DIAGNOSIS — I2510 Atherosclerotic heart disease of native coronary artery without angina pectoris: Secondary | ICD-10-CM | POA: Diagnosis not present

## 2016-01-02 DIAGNOSIS — E785 Hyperlipidemia, unspecified: Secondary | ICD-10-CM

## 2016-01-02 DIAGNOSIS — I10 Essential (primary) hypertension: Secondary | ICD-10-CM

## 2016-01-02 DIAGNOSIS — I2583 Coronary atherosclerosis due to lipid rich plaque: Secondary | ICD-10-CM

## 2016-01-02 NOTE — Patient Instructions (Signed)
Your physician wants you to follow-up in: 1 year or sooner for sleep with Dr Claiborne Billings. You will receive a reminder letter in the mail two months in advance. If you don't receive a letter, please call our office to schedule the follow-up appointment.

## 2016-01-02 NOTE — Progress Notes (Signed)
Patient ID: Rick Melendez, male   DOB: 1936/06/06, 80 y.o.   MRN: [id number]     HPI: Rick Melendez is a 80 y.o. male who presents for sleep clinic evaluation.  He is followed by Dr. Gwenlyn Found for cardiology care.  Rick Melendez has a history of hypertension, hyperlipidemia, CAD, and is status post CABG revascularization surgery in September 2007.  He also has a history of peripheral vascular disease.  In May 2013 he was diagnosed as having severe [facility] and his AHI was 71.3 with significant oxygen desaturation to 81%.  There was evidence for severe snoring and also periodic limb movements.  The patient has been on CPAP therapy since that time and has been using [facility] for his DME.  Apparently, with the recent Medicare contract renewals, he had to switch to [facility] for his DME company.  He is required by [facility] for a face-to-face evaluation in order to start initiating supplies.  The patient admits to being fairly compliant.  He typically goes to bed 2011 at 11:30 PM and wakes up at 6 AM.  He has lost 25 pounds since initiating CPAP therapy.  He has been using a Swift  FX nasal pillow mask.  He has a Respironics CPAP unit.  He presently denies any residual daytime sleepiness.  He denies painful restless legs.  He denies hypnogagnic hallucinations.   Epworth Sleepiness Scale: Situation   Chance of Dozing/Sleeping (0 = never , 1 = slight chance , 2 = moderate chance , 3 = high chance )   sitting and reading 1   watching TV 2   sitting inactive in a public place 0   being a passenger in a motor vehicle for an hour or more 0   lying down in the afternoon 1   sitting and talking to someone 0   sitting quietly after lunch (no alcohol) 0   while stopped for a few minutes in traffic as the driver 0   Total Score  4    Past Medical History  Diagnosis Date  . OSA on CPAP     Dr Claiborne Billings - follows and treatmentof sleep apnea  .  Hypertension   . Hyperlipidemia   . Diabetes mellitus without complication ([place])   . PVD (peripheral vascular disease) ([place]) 2003    DR BERRY-  -LEFT LEG STENTING  . Coronary artery disease 2007    cabg x3    Past Surgical History  Procedure Laterality Date  . Doppler echocardiography  07/08/2011    EF =>55%  . Cpet  04/13/2012    normal  . Nm myocar perf wall motion  7/182012    EF 59%, normal myocardial perfusio  . Cardiac catheterization  0911/2007    3 vessel CAD WITH LEFT MAIN CAD SEVERE ,norm lLIMA,LV nom. ,evidence for very mild aortic stenosis with gradient, mild stenosis distal abdnminal aotra and proximal left common iliac artery 30%  . Lower arterial doppler  07/01/2012    mildly abnormal lower extremity;ABIs >1 bilaterally with moderately high-grade right iliac stenosis that had not changed from proir study.  . Coronary artery bypass graft  08/31/2006    DR GERHARDT---LIMA to LAD, VEIN TO AN OBTUSE MARGINAL BRANCH AND  RIGHT CORONARY ARTERY    No Known Allergies  Current Outpatient Prescriptions  Medication Sig Dispense Refill  . aspirin EC 81 MG tablet Take 81 mg by mouth daily.    [name]  clopidogrel (PLAVIX) 75 MG tablet Take 75 mg by mouth daily.    . Coenzyme Q10 (COQ10) 200 MG CAPS Take 200 mg by mouth daily. 90 capsule 3  . ezetimibe (ZETIA) 10 MG tablet Take 10 mg by mouth daily.    [name] glipiZIDE (GLUCOTROL) 5 MG tablet Take 5 mg by mouth daily.    [name] HYDROcodone-acetaminophen (NORCO/VICODIN) 5-325 MG per tablet Take 1 tablet by mouth 2 (two) times daily.  0  . lisinopril (PRINIVIL,ZESTRIL) 20 MG tablet Take 20 mg by mouth daily.    . metoprolol tartrate (LOPRESSOR) 25 MG tablet Take 25 mg by mouth daily.    . Omega-3 Fatty Acids (OMEGA-3 FISH OIL) 1200 MG CAPS Take by mouth. Take 4 capsules daily    . OVER THE COUNTER MEDICATION USES CPAP NIGHTLY    . triamterene-hydrochlorothiazide (MAXZIDE-25) 37.5-25 MG per tablet Take 1 tablet by mouth daily.     No current  facility-administered medications for this visit.    Social History   Social History  . Marital Status: Married    Spouse Name: N/A  . Number of Children: N/A  . Years of Education: N/A   Occupational History  . Not on file.   Social History Main Topics  . Smoking status: Former Smoker    Types: Cigarettes    Quit date: 09/03/1981  . Smokeless tobacco: Current User    Types: Chew  . Alcohol Use: Yes  . Drug Use: No  . Sexual Activity: Not on file   Other Topics Concern  . Not on file   Social History Narrative    Family History  Problem Relation Age of Onset  . Cancer Father   . Cancer Brother      ROS General: Negative; No fevers, chills, or night sweats HEENT: Negative; No changes in vision or hearing, sinus congestion, difficulty swallowing Pulmonary: Negative; No cough, wheezing, shortness of breath, hemoptysis Cardiovascular: Negative; No chest pain, presyncope, syncope, palpatations GI: Negative; No nausea, vomiting, diarrhea, or abdominal pain GU: Negative; No dysuria, hematuria, or difficulty voiding Musculoskeletal: Negative; no myalgias, joint pain, or weakness Hematologic: Negative; no easy bruising, bleeding Endocrine: Negative; no heat/cold intolerance Neuro: Negative; no changes in balance, headaches Skin: Negative; No rashes or skin lesions Psychiatric: Negative; No behavioral problems, depression Sleep: Positive for obstructive sleep apnea on CPAP therapy; No residual daytime sleepiness, hypersomnolence, bruxism, restless legs, hypnogognic hallucinations, no cataplexy   Physical Exam BP 104/58 mmHg  Pulse 73  Ht 5\' 7"  (1.702 m)  Wt 221 lb 9.6 oz (100.517 kg)  BMI 34.70 kg/m2  SpO2 94%  Wt Readings from Last 3 Encounters:  01/02/16 221 lb 9.6 oz (100.517 kg)  04/03/15 229 lb (103.874 kg)  03/20/14 243 lb 8 oz (110.451 kg)   General: Alert, oriented, no distress.  Skin: normal turgor, no rashes HEENT: Normocephalic, atraumatic. Pupils  round and reactive; sclera anicteric; extraocular muscles intact; Fundi without hemorrhages or exudates Nose without nasal septal hypertrophy Mouth/Parynx benign; Mallinpatti scale 3 Neck: No JVD, no carotid briuts Lungs: clear to ausculatation and percussion; no wheezing or rales  Chest wall: No tenderness to palpation Heart: RRR, s1 s2 normal; faint 1/6 systolic murmur.  No diastolic murmur.  No rubs thrills or heaves. Abdomen: soft, nontender; no hepatosplenomehaly, BS+; abdominal aorta nontender and not dilated by palpation. Back: No CVA tenderness Pulses 2+ Extremities: no clubbinbg cyanosis or edema, Homan's sign negative  Neurologic: grossly nonfocal; cranial nerves intact. Psychological: Normal affect and mood.  LABS:  No flowsheet data found.   No flowsheet data found.   No flowsheet data found.   Lipid Panel  No results found for: CHOL, TRIG, HDL, CHOLHDL, VLDL, LDLCALC, LDLDIRECT   RADIOLOGY: No results found.    ASSESSMENT AND PLAN: Rick Melendez is a 80 year old white male who has significant cardiovascular comorbidities including hypertension, hyperlipidemia, CAD, PVD, and who is status post CBG revascularization surgery.  He has had previously documented severe obstructive sleep apnea and has been using CPAP therapy since 2013.  I reviewed his sleep study with him in detail.  I also discussed with him the importance of increased duration of CPAP use.  He has lost approximately 30 pounds of weight since he was diagnosed with OSA which undoubtedly has been beneficial.  We discussed proper maintenance.  I have written prescriptions for him to have supplies through [facility].  His blood pressure today is stable and on repeat by me was 120/70 on his current dose of Maxzide, metoprolol, tartrate, and lisinopril.  He continues to be on dual antiplatelet therapy with aspirin and Plavix without bleeding.  He is on Zetia for hyperlipidemia and glipizide for type 2  diabetes mellitus.  I will see him in one year for follow-up sleep evaluation per Medicare requirements.   Time spent: 25 minutes  Troy Sine, MD, [facility]  01/02/2016 3:32 PM

## 2016-01-17 IMAGING — MR MR LUMBAR SPINE W/O CM
5 series · 32 of 48 positions shown · non-contrast
Comparison: None.

CLINICAL DATA: Low back pain extending into the right lower
extremity. Weakness.

EXAM:
MRI LUMBAR SPINE WITHOUT CONTRAST
TECHNIQUE: Multiplanar, multisequence MR imaging of the lumbar spine was
performed. No intravenous contrast was administered.

[Series 3: T2 · sagittal · 4.0mm · 0.49mm/px · 6 of 12 slices shown (1 of 2)]
[im 1/12]
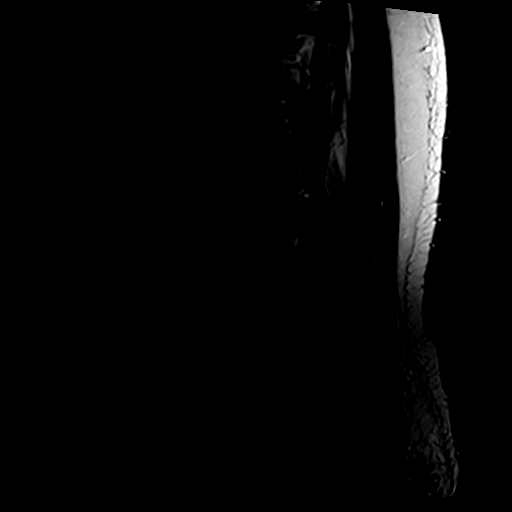
[im 3/12]
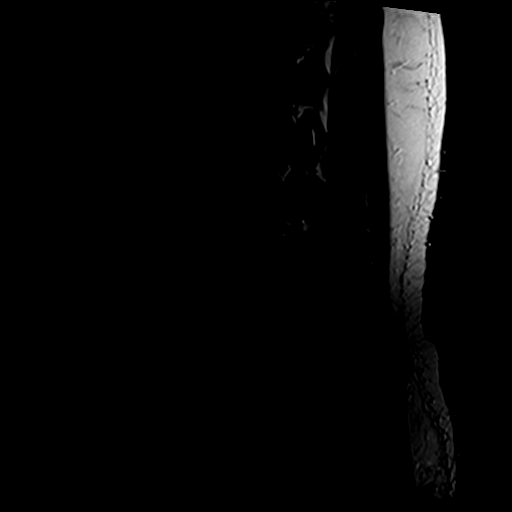
[im 5/12]
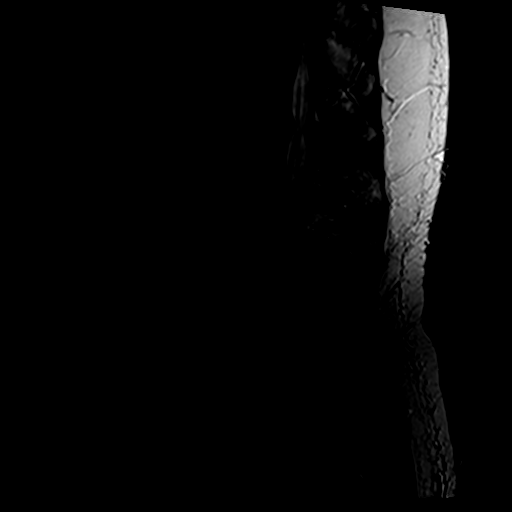
[im 7/12]
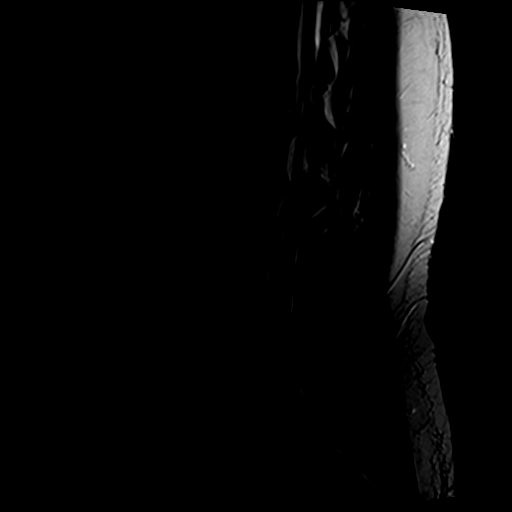
[im 9/12]
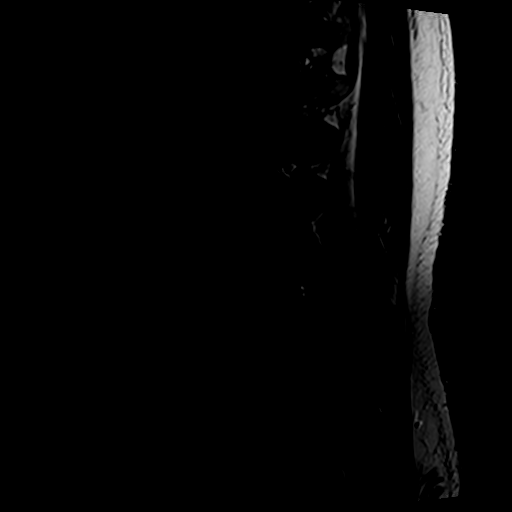
[im 12/12]
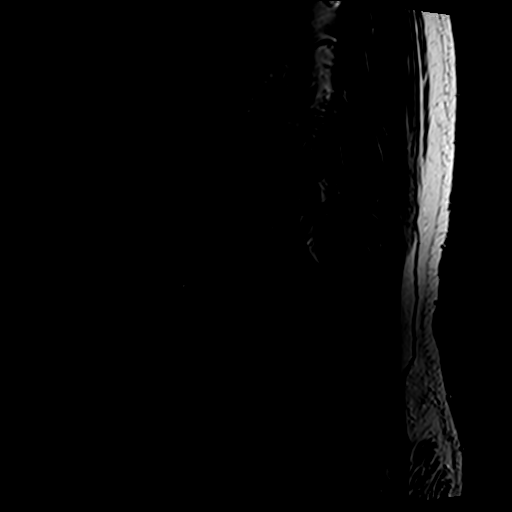

[Series 4: T1 · sagittal · 4.0mm · 0.49mm/px · 5 of 12 slices shown (1 of 2)]
[im 1/12]
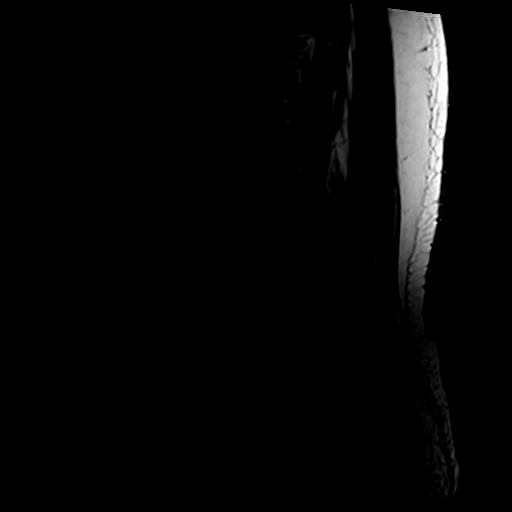
[im 3/12]
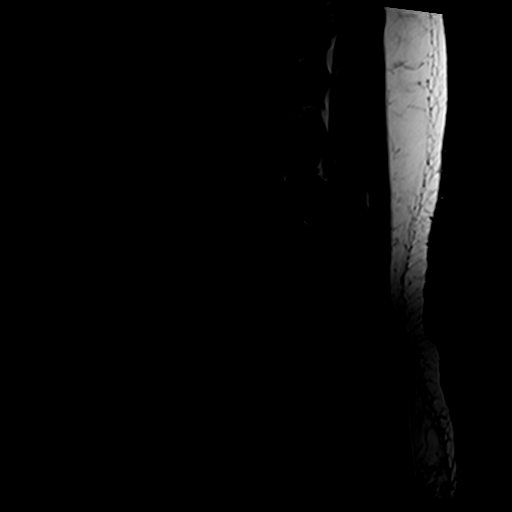
[im 6/12]
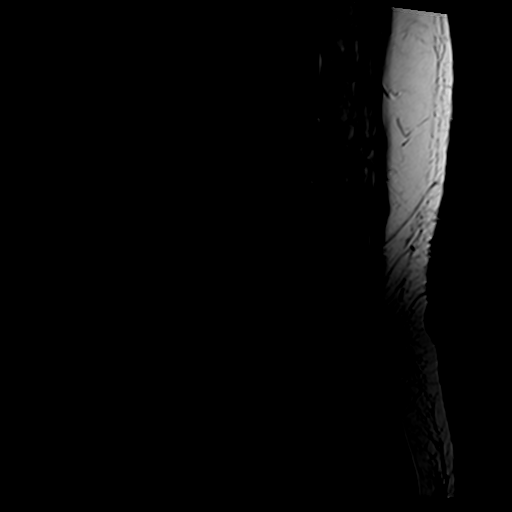
[im 9/12]
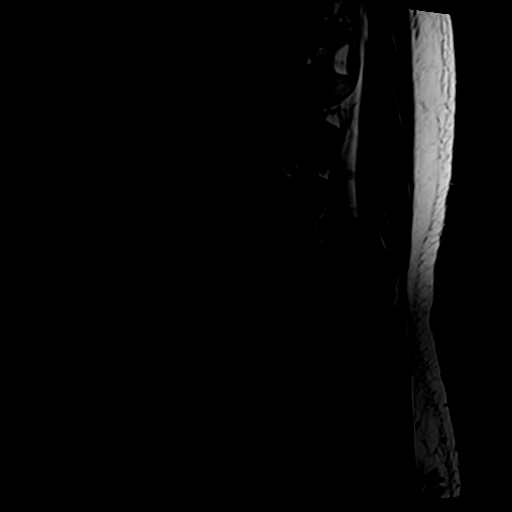
[im 12/12]
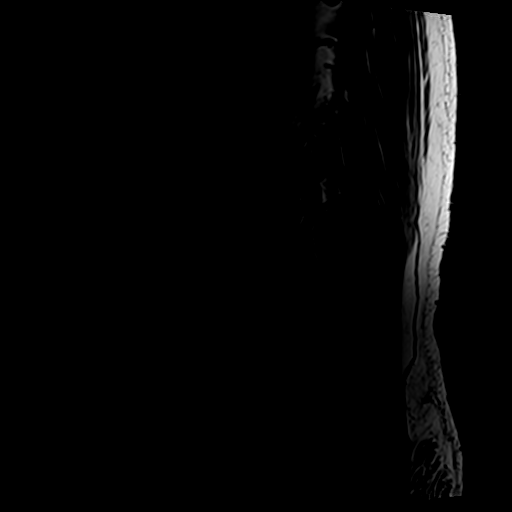

[Series 5: STIR · sagittal · 4.0mm · 0.49mm/px · 1 of 12 slices shown]
[im 1/12]
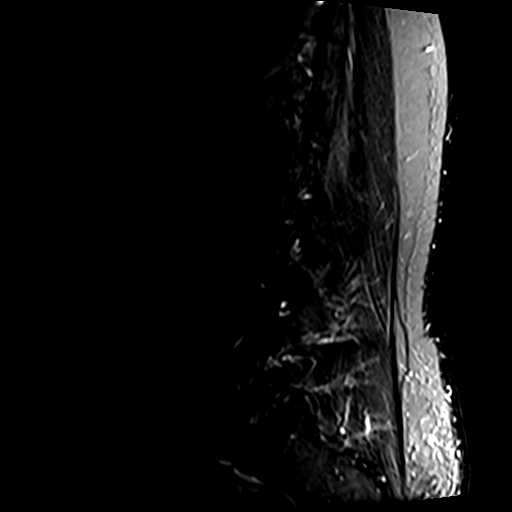

[Series 6: T2 · axial · 4.0mm · 0.74mm/px · z∈[-140,+50]mm · 10 of 37 slices shown (2 of 2)]
[im 3/37]
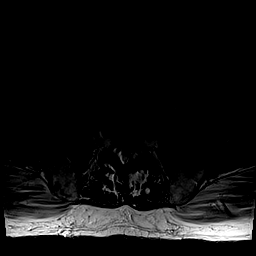
[im 5/37]
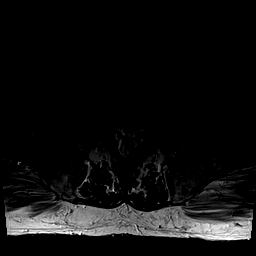
[im 8/37]
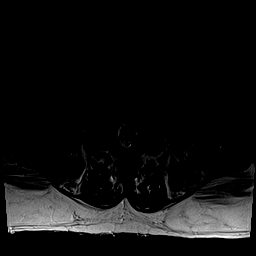
[im 13/37]
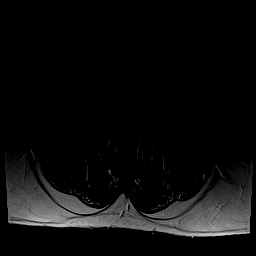
[im 17/37]
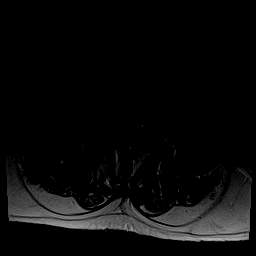
[im 20/37]
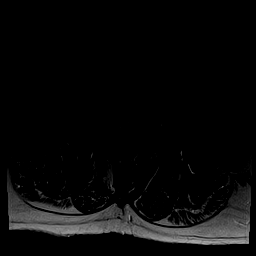
[im 22/37]
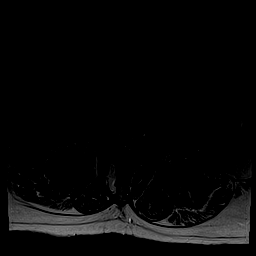
[im 27/37]
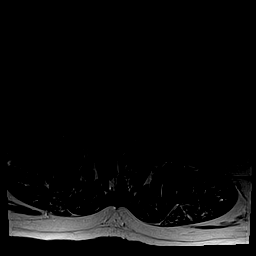
[im 32/37]
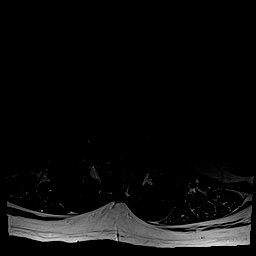
[im 37/37]
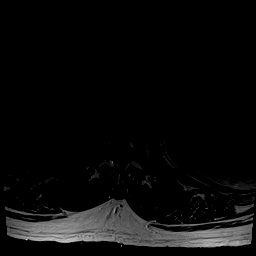

[Series 7: T1 · axial · 4.0mm · 0.74mm/px · z∈[-140,+50]mm · 10 of 37 slices shown (2 of 2)]
[im 3/37]
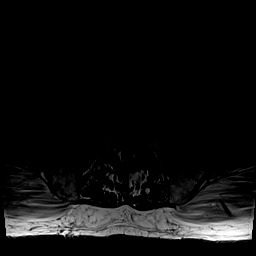
[im 5/37]
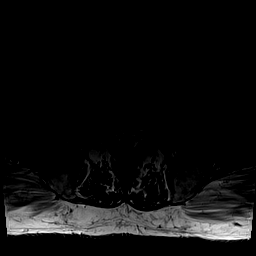
[im 8/37]
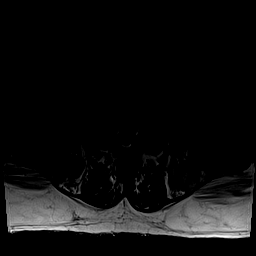
[im 13/37]
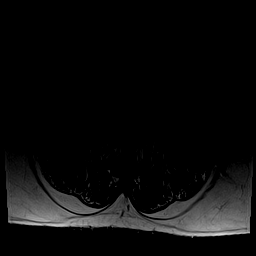
[im 17/37]
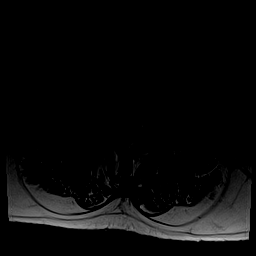
[im 20/37]
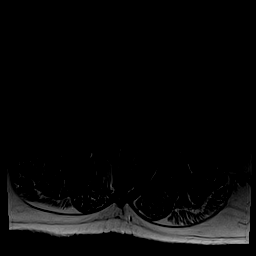
[im 22/37]
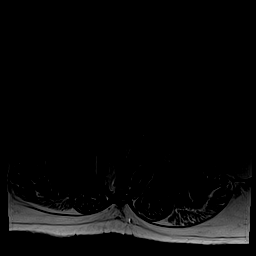
[im 27/37]
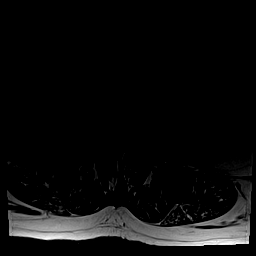
[im 32/37]
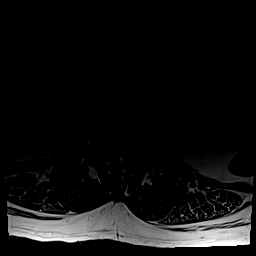
[im 37/37]
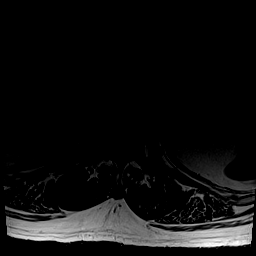

[32 of 48 positions shown; findings below may reference images not displayed]

FINDINGS: Normal signal is present in the conus medullaris which terminates at
L1-2, within normal limits. Schmorl's nodes are present from T10-11
through L1-2 and along the inferior endplates of L3 and L4. Grade 1
anterolisthesis at L4 measures 4.5 mm. Alignment is otherwise
anatomic.

Limited imaging of the abdomen is unremarkable.

T12-L1: A leftward disc protrusion is present. Asymmetric left-sided
facet hypertrophy is noted. This leads to mild left lateral recess
and moderate left foraminal stenosis.

L1-2: A broad-based disc protrusion is present. Mild lateral recess
narrowing is worse on the left.

L2-3: A broad-based disc protrusion is present. Mild facet
hypertrophy is noted. There is no significant stenosis.

L3-4: A broad-based disc protrusion is present. There is a far right
lateral component with moderate right foraminal stenosis. Mild left
foraminal narrowing is evident. Mild lateral recess narrowing is
noted bilaterally.

L4-5: Advanced facet hypertrophy is present. There is uncovering of
a broad-based disc herniation. Moderate lateral recess narrowing is
worse on the left. Mild to moderate left and moderate right
foraminal stenosis is evident.

L5-S1: A leftward disc protrusion is present. Mild facet hypertrophy
is noted. This results in mild left lateral recess narrowing. The
foramina are patent.
IMPRESSION: 1. The most significant right-sided disease is at L3-4 and L4-5.
2. Moderate right and mild left foraminal stenosis at L3-4 with mild
lateral recess narrowing bilaterally.
3. Advanced facet hypertrophy and spurring, and broad-based disc
protrusion, and anterolisthesis leads to moderate lateral recess
narrowing at L4-5, worse on the left.
4. Mild to moderate left and moderate right foraminal stenosis at
L4-5.
5. Mild left lateral recess narrowing at L5-S1.
6. Mild left lateral recess and moderate left foraminal stenosis at
T12-L1.
7. Mild lateral recess narrowing at L1-2 is worse on the left.

## 2016-02-17 ENCOUNTER — Telehealth: Payer: Self-pay | Admitting: *Deleted

## 2016-02-17 NOTE — Telephone Encounter (Signed)
Faxed order  for supplies dated 01/22/16 to [facility].

## 2016-05-27 ENCOUNTER — Telehealth: Payer: Self-pay | Admitting: Cardiovascular Disease

## 2016-05-27 NOTE — Telephone Encounter (Signed)
New Message  Pat @ [facility] wanted to discuss DX w/ RN. Please call back and discuss.

## 2016-05-27 NOTE — Telephone Encounter (Signed)
LM for Pat to call back

## 2016-05-27 NOTE — Telephone Encounter (Signed)
Spoke with Fraser Din @ [place]. Patient has new insurance that requires patient to have a diagnosis for why they see a specialist. Informed Pat patient has CAD, PVD. Wife apparently was unaware of this - advised it may be a good idea for wife to come to MD appt with her husband if he would approve so that she is aware of his medical history.

## 2016-06-01 ENCOUNTER — Other Ambulatory Visit: Payer: Self-pay | Admitting: Cardiovascular Disease

## 2016-06-01 DIAGNOSIS — I739 Peripheral vascular disease, unspecified: Secondary | ICD-10-CM

## 2016-06-15 DIAGNOSIS — R05 Cough: Secondary | ICD-10-CM | POA: Diagnosis not present

## 2016-06-16 ENCOUNTER — Ambulatory Visit: Payer: Medicare Other | Admitting: Cardiovascular Disease

## 2016-06-16 ENCOUNTER — Encounter: Payer: Self-pay | Admitting: Cardiovascular Disease

## 2016-06-16 ENCOUNTER — Ambulatory Visit (INDEPENDENT_AMBULATORY_CARE_PROVIDER_SITE_OTHER): Payer: Commercial Managed Care - HMO | Admitting: Cardiovascular Disease

## 2016-06-16 ENCOUNTER — Ambulatory Visit (HOSPITAL_COMMUNITY)
Admission: RE | Admit: 2016-06-16 | Discharge: 2016-06-16 | Disposition: A | Discharge status: Home / Self Care | Payer: Commercial Managed Care - HMO | Source: Ambulatory Visit | Attending: Cardiology | Admitting: Cardiology

## 2016-06-16 VITALS — BP 112/52 | HR 72 | Ht 66.0 in | Wt 223.1 lb

## 2016-06-16 DIAGNOSIS — I2510 Atherosclerotic heart disease of native coronary artery without angina pectoris: Secondary | ICD-10-CM | POA: Diagnosis not present

## 2016-06-16 DIAGNOSIS — I739 Peripheral vascular disease, unspecified: Secondary | ICD-10-CM

## 2016-06-16 DIAGNOSIS — E119 Type 2 diabetes mellitus without complications: Secondary | ICD-10-CM | POA: Diagnosis not present

## 2016-06-16 DIAGNOSIS — I10 Essential (primary) hypertension: Secondary | ICD-10-CM | POA: Diagnosis not present

## 2016-06-16 DIAGNOSIS — E785 Hyperlipidemia, unspecified: Secondary | ICD-10-CM | POA: Insufficient documentation

## 2016-06-16 DIAGNOSIS — I700 Atherosclerosis of aorta: Secondary | ICD-10-CM | POA: Insufficient documentation

## 2016-06-16 DIAGNOSIS — G4733 Obstructive sleep apnea (adult) (pediatric): Secondary | ICD-10-CM | POA: Diagnosis not present

## 2016-06-16 DIAGNOSIS — G4730 Sleep apnea, unspecified: Secondary | ICD-10-CM

## 2016-06-16 DIAGNOSIS — I7789 Other specified disorders of arteries and arterioles: Secondary | ICD-10-CM | POA: Diagnosis not present

## 2016-06-16 DIAGNOSIS — I708 Atherosclerosis of other arteries: Secondary | ICD-10-CM | POA: Diagnosis not present

## 2016-06-16 DIAGNOSIS — I2583 Coronary atherosclerosis due to lipid rich plaque: Principal | ICD-10-CM

## 2016-06-16 NOTE — Assessment & Plan Note (Signed)
History of coronary artery disease status post bypass grafting 2 because of left main disease 08/31/2006 by Dr. Servando Snare. He had a LIMA to the LAD and vein to obtuse marginal branch as well as right coronary artery. He denies chest pain or shortness of breath. His last Myoview performed 07/06/13 was nonischemic.

## 2016-06-16 NOTE — Assessment & Plan Note (Signed)
History of hyperlipidemia on statin therapy followed by his PCP 

## 2016-06-16 NOTE — Patient Instructions (Signed)
Medication Instructions:  Your physician recommends that you continue on your current medications as directed. Please refer to the Current Medication list given to you today.   Labwork: I WILL REQUEST YOUR LAB WORK FROM YOUR PCP.  Testing/Procedures: NONE  Follow-Up: Your physician wants you to follow-up in: [place]. You will receive a reminder letter in the mail two months in advance. If you don't receive a letter, please call our office to schedule the follow-up appointment.   Any Other Special Instructions Will Be Listed Below (If Applicable).     If you need a refill on your cardiac medications before your next appointment, please call your pharmacy.

## 2016-06-16 NOTE — Assessment & Plan Note (Signed)
History of obstructive sleep apnea on C Pap followed by Dr. Claiborne Billings

## 2016-06-16 NOTE — Progress Notes (Signed)
06/16/2016 Rick RUDDEN   Mar 10, 1936  [id number]  Primary Physician [name], MD Primary Cardiologist: Lorretta Harp MD Renae Gloss  HPI:  The patient is a 80 year old, severely obese, married Caucasian male, father of 2, grandfather to 2 grandchildren who is followed by Dr Gwenlyn Found and Dr Wilson Singer. I last saw him  04/03/15. 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 He doesn't do any regular exercise but he active around the farm with chores. He has a remote history tobacco abuse, having quit 20 years ago, as well as, treated hypertension and hyperlipidemia, and diabetes.  He has CAD and had left main 2-vessel disease by cath August 31, 2006, and underwent coronary artery bypass grafting x2 by Dr. Ceasar Mons with a LIMA to his LAD, a vein to an obtuse marginal branch and right coronary arter. He has also had stenting of his left leg back in 2003, doppler in July 2013 showed Rt iliac disease but he has been asymptomatic. He denies chest pain or shortness of breath. He does have daytime fatigue and this is unchanged. He has had some leg pain which he attributes to statin Rx. His last Myoview was performed 07/06/13 was nonischemic.[name] He sees Dr. Claiborne Billings for followup and treatment of his sleep apnea and he reports he is compliant with this. Since I saw him one year ago he remains fairly stable and denies chest pain or shortness of breath.   Current Outpatient Prescriptions  Medication Sig Dispense Refill  . aspirin EC 81 MG tablet Take 81 mg by mouth daily.    . clopidogrel (PLAVIX) 75 MG tablet Take 75 mg by mouth daily.    . Coenzyme Q10 (COQ10) 200 MG CAPS Take 200 mg by mouth daily. 90 capsule 3  . ezetimibe (ZETIA) 10 MG tablet Take 10 mg by mouth daily.    [name] glipiZIDE (GLUCOTROL) 5 MG tablet Take 5 mg by mouth daily.    [name] HYDROcodone-acetaminophen (NORCO/VICODIN) 5-325 MG per tablet Take 1 tablet by  mouth 2 (two) times daily.  0  . lisinopril (PRINIVIL,ZESTRIL) 20 MG tablet Take 20 mg by mouth daily.    . metoprolol tartrate (LOPRESSOR) 25 MG tablet Take 25 mg by mouth daily.    . Omega-3 Fatty Acids (OMEGA-3 FISH OIL) 1200 MG CAPS Take by mouth. Take 4 capsules daily    . omeprazole (PRILOSEC) 20 MG capsule Take 1 capsule by mouth daily.  0  . OVER THE COUNTER MEDICATION USES CPAP NIGHTLY    . rosuvastatin (CRESTOR) 20 MG tablet Take 1 tablet by mouth daily.  0  . triamterene-hydrochlorothiazide (MAXZIDE-25) 37.5-25 MG per tablet Take 1 tablet by mouth daily.     No current facility-administered medications for this visit.    No Known Allergies  Social History   Social History  . Marital Status: Married    Spouse Name: N/A  . Number of Children: N/A  . Years of Education: N/A   Occupational History  . Not on file.   Social History Main Topics  . Smoking status: Former Smoker    Types: Cigarettes    Quit date: 09/03/1981  . Smokeless tobacco: Current User    Types: Chew  . Alcohol Use: Yes  . Drug Use: No  . Sexual Activity: Not on file   Other Topics Concern  . Not on file   Social History Narrative  Review of Systems: General: negative for chills, fever, night sweats or weight changes.  Cardiovascular: negative for chest pain, dyspnea on exertion, edema, orthopnea, palpitations, paroxysmal nocturnal dyspnea or shortness of breath Dermatological: negative for rash Respiratory: negative for cough or wheezing Urologic: negative for hematuria Abdominal: negative for nausea, vomiting, diarrhea, bright red blood per rectum, melena, or hematemesis Neurologic: negative for visual changes, syncope, or dizziness All other systems reviewed and are otherwise negative except as noted above.    Blood pressure 112/52, pulse 72, height 5\' 6"  (1.676 m), weight 223 lb 2 oz (101.209 kg).  General appearance: alert and no distress Neck: no adenopathy, no carotid bruit, no  JVD, supple, symmetrical, trachea midline and thyroid not enlarged, symmetric, no tenderness/mass/nodules Lungs: clear to auscultation bilaterally Heart: regular rate and rhythm, S1, S2 normal, no murmur, click, rub or gallop Extremities: extremities normal, atraumatic, no cyanosis or edema  EKG normal sinus rhythm at 72 with septal Q waves. I personally reviewed this EKG  ASSESSMENT AND PLAN:   CAD - CABG X 2 9/07 LIMA-LAD, SVG-OM. Low risk Myoview 7/12 History of coronary artery disease status post bypass grafting 2 because of left main disease 08/31/2006 by Dr. Servando Snare. He had a LIMA to the LAD and vein to obtuse marginal branch as well as right coronary artery. He denies chest pain or shortness of breath. His last Myoview performed 07/06/13 was nonischemic.  HTN (hypertension) History of hypertension blood pressure measured at 112/52. He is on lisinopril, Maxzide  and metoprolol. Continue current meds at current dosing  Dyslipidemia History of hyperlipidemia on statin therapy followed by his PCP  Sleep apnea- compliant with C-pap History of obstructive sleep apnea on C Pap followed by Dr. Claiborne Billings  PVD (peripheral vascular disease)- Rt iliac disease by doppler 7/13 History of peripheral vascular disease status post remote iliac stenting by Dr. Glade Lloyd in 2003.. The patient denies claudication. He had Doppler studies performed today in our office.      Lorretta Harp MD FACP,FACC,FAHA, [facility] 06/16/2016 3:43 PM

## 2016-06-16 NOTE — Assessment & Plan Note (Signed)
History of peripheral vascular disease status post remote iliac stenting by Dr. Glade Lloyd in 2003.. The patient denies claudication. He had Doppler studies performed today in our office.

## 2016-06-16 NOTE — Assessment & Plan Note (Signed)
History of hypertension blood pressure measured at 112/52. He is on lisinopril, Maxzide  and metoprolol. Continue current meds at current dosing

## 2016-06-19 ENCOUNTER — Telehealth: Payer: Self-pay | Admitting: *Deleted

## 2016-06-19 DIAGNOSIS — I739 Peripheral vascular disease, unspecified: Secondary | ICD-10-CM

## 2016-06-19 NOTE — Telephone Encounter (Signed)
Order entered

## 2016-06-19 NOTE — Telephone Encounter (Signed)
-----   Message from Lorretta Harp, MD sent at 06/19/2016  7:59 AM EDT ----- No change from prior study. Repeat in 12 months.

## 2016-09-25 ENCOUNTER — Other Ambulatory Visit: Payer: Self-pay | Admitting: Pharmacist

## 2016-09-25 NOTE — Patient Outreach (Signed)
Outreach call to [employer] regarding his request for follow up from the [facility] Medication Adherence Campaign. Left a HIPAA compliant message on the patient's voicemail.  Harlow Asa, PharmD Clinical Pharmacist [facility] [phone]

## 2016-09-30 ENCOUNTER — Encounter (INDEPENDENT_AMBULATORY_CARE_PROVIDER_SITE_OTHER): Payer: Commercial Managed Care - HMO | Admitting: Physical Medicine and Rehabilitation

## 2016-09-30 DIAGNOSIS — M47816 Spondylosis without myelopathy or radiculopathy, lumbar region: Secondary | ICD-10-CM | POA: Diagnosis not present

## 2016-09-30 DIAGNOSIS — M545 Low back pain: Secondary | ICD-10-CM | POA: Diagnosis not present

## 2016-10-28 ENCOUNTER — Ambulatory Visit (INDEPENDENT_AMBULATORY_CARE_PROVIDER_SITE_OTHER): Payer: Commercial Managed Care - HMO | Admitting: Orthopedic Surgery

## 2016-11-03 ENCOUNTER — Encounter (INDEPENDENT_AMBULATORY_CARE_PROVIDER_SITE_OTHER): Payer: Self-pay | Admitting: Orthopedic Surgery

## 2016-11-03 ENCOUNTER — Ambulatory Visit (INDEPENDENT_AMBULATORY_CARE_PROVIDER_SITE_OTHER): Payer: Commercial Managed Care - HMO | Admitting: Orthopedic Surgery

## 2016-11-03 ENCOUNTER — Ambulatory Visit (INDEPENDENT_AMBULATORY_CARE_PROVIDER_SITE_OTHER): Payer: Commercial Managed Care - HMO

## 2016-11-03 VITALS — Ht 67.0 in | Wt 225.0 lb

## 2016-11-03 DIAGNOSIS — M25562 Pain in left knee: Secondary | ICD-10-CM

## 2016-11-03 DIAGNOSIS — M1711 Unilateral primary osteoarthritis, right knee: Secondary | ICD-10-CM | POA: Diagnosis not present

## 2016-11-03 DIAGNOSIS — G8929 Other chronic pain: Secondary | ICD-10-CM

## 2016-11-03 DIAGNOSIS — M25561 Pain in right knee: Secondary | ICD-10-CM | POA: Diagnosis not present

## 2016-11-03 DIAGNOSIS — M1712 Unilateral primary osteoarthritis, left knee: Secondary | ICD-10-CM

## 2016-11-03 MED ORDER — LIDOCAINE HCL 1 % IJ SOLN
5.0000 mL | INTRAMUSCULAR | Status: AC | PRN
  Administered 2016-11-03: 5 mL

## 2016-11-03 MED ORDER — METHYLPREDNISOLONE ACETATE 40 MG/ML IJ SUSP
40.0000 mg | INTRAMUSCULAR | Status: AC | PRN
  Administered 2016-11-03: 40 mg via INTRA_ARTICULAR

## 2016-11-03 NOTE — Progress Notes (Signed)
Office Visit Note   Patient: Rick Melendez           Date of Birth: 26-Oct-1936           MRN: [id number] Visit Date: 11/03/2016              Requested by: Anda Kraft, MD [address] [place], [place] [address] PCP: [name], MD   Assessment & Plan: Visit Diagnoses:  1. Chronic pain of both knees     Plan: We'll follow up in the office as needed.  Follow-Up Instructions: No Follow-up on file.   Orders:  Orders Placed This Encounter  Procedures  . XR Knee 1-2 Views Right  . XR Knee 1-2 Views Left   No orders of the defined types were placed in this encounter.     Procedures: Large Joint Inj Date/Time: 11/03/2016 2:52 PM Performed by: Dondra Prader RENEE Authorized by: Dondra Prader RENEE   Consent Given by:  Patient Site marked: the procedure site was marked   Timeout: prior to procedure the correct patient, procedure, and site was verified   Indications:  Pain and diagnostic evaluation Location:  Knee Site:  L knee Needle Size:  22 G Needle Length:  1.5 inches Ultrasound Guidance: No   Fluoroscopic Guidance: No   Arthrogram: No   Medications:  5 mL lidocaine 1 %; 40 mg methylPREDNISolone acetate 40 MG/ML Aspiration Attempted: No   Patient tolerance:  Patient tolerated the procedure well with no immediate complications Large Joint Inj Date/Time: 11/03/2016 2:53 PM Performed by: Dondra Prader RENEE Authorized by: Dondra Prader RENEE   Consent Given by:  Patient Site marked: the procedure site was marked   Timeout: prior to procedure the correct patient, procedure, and site was verified   Indications:  Pain and diagnostic evaluation Location:  Knee Site:  R knee Needle Size:  22 G Needle Length:  1.5 inches Ultrasound Guidance: No   Fluoroscopic Guidance: No   Arthrogram: No   Medications:  5 mL lidocaine 1 %; 40 mg methylPREDNISolone acetate 40 MG/ML Aspiration Attempted: No   Patient tolerance:  Patient tolerated the  procedure well with no immediate complications     Clinical Data: No additional findings.   Subjective: Chief Complaint  Patient presents with  . Left Knee - Pain  . Right Knee - Pain    Patient presents with bilateral knee pain ongoing problem, most recently over the past month. Patient denies swelling, he does feel tightness, he does have giving way of both knees. He does have popping in left.   He has been having great interval relief with steroid injections. Last injection was just over 6 months ago. Has having locking and catching has had occasional falls.  Review of Systems  Constitutional: Negative for chills and fever.  Musculoskeletal: Positive for arthralgias. Negative for gait problem and joint swelling.  All other systems reviewed and are negative.    Objective: Vital Signs: Ht 5\' 7"  (1.702 m)   Wt 225 lb (102.1 kg)   BMI 35.24 kg/m   Physical Exam  Musculoskeletal:       Right knee: He exhibits no effusion.       Left knee: He exhibits no effusion.   Physical Exam  Constitutional: He is oriented to person, place, and time. He appears well-developed and well-nourished.  Pulmonary/Chest: Effort normal.  Neurological: He is alert and oriented to person, place, and time.  Psychiatric: He has a normal mood and affect.  Nursing note reviewed.   Right Knee Exam   Tenderness  The patient is experiencing tenderness in the medial joint line.  Range of Motion  The patient has normal right knee ROM.  Tests  Varus: negative Valgus: negative  Other  Swelling: none Other tests: no effusion present   Left Knee Exam   Tenderness  The patient is experiencing tenderness in the medial joint line.  Range of Motion  The patient has normal left knee ROM.  Tests  Varus: negative Valgus: negative  Other  Swelling: none Effusion: no effusion present      Specialty Comments:  No specialty comments available.  Imaging: No results  found.   PMFS History: Patient Active Problem List   Diagnosis Date Noted  . CAD - CABG X 2 9/07 LIMA-LAD, SVG-OM. Low risk Myoview 7/12 09/07/2013  . HTN (hypertension) 09/07/2013  . Dyslipidemia 09/07/2013  . Diabetes mellitus ([place]) 09/07/2013  . Chronic renal insufficiency, stage III (moderate)- SCr 1.6 (Aug 2013) 09/07/2013  . Sleep apnea- compliant with C-pap 09/07/2013  . PVD (peripheral vascular disease)- Rt iliac disease by doppler 7/13 09/07/2013   Past Medical History:  Diagnosis Date  . Coronary artery disease 2007   cabg x3  . Diabetes mellitus without complication ([facility])   . Hyperlipidemia   . Hypertension   . OSA on CPAP    Dr Claiborne Billings - follows and treatmentof sleep apnea  . PVD (peripheral vascular disease) ([facility]) 2003   DR BERRY-  -LEFT LEG STENTING    Family History  Problem Relation Age of Onset  . Cancer Father   . Cancer Brother     Past Surgical History:  Procedure Laterality Date  . CARDIAC CATHETERIZATION  0911/2007   3 vessel CAD WITH LEFT MAIN CAD SEVERE ,norm lLIMA,LV nom. ,evidence for very mild aortic stenosis with gradient, mild stenosis distal abdnminal aotra and proximal left common iliac artery 30%  . CORONARY ARTERY BYPASS GRAFT  08/31/2006   DR GERHARDT---LIMA to LAD, VEIN TO AN OBTUSE MARGINAL BRANCH AND  RIGHT CORONARY ARTERY  . CPET  04/13/2012   normal  . DOPPLER ECHOCARDIOGRAPHY  07/08/2011   EF =>55%  . lower arterial doppler  07/01/2012   mildly abnormal lower extremity;ABIs >1 bilaterally with moderately high-grade right iliac stenosis that had not changed from proir study.  [name] NM MYOCAR PERF WALL MOTION  7/182012   EF 59%, normal myocardial perfusio   Social History   Occupational History  . Not on file.   Social History Main Topics  . Smoking status: Former Smoker    Types: Cigarettes    Quit date: 09/03/1981  . Smokeless tobacco: Current User    Types: Chew  . Alcohol use Yes  . Drug use: No  . Sexual activity: Not on file

## 2017-01-14 ENCOUNTER — Ambulatory Visit: Payer: Commercial Managed Care - HMO | Admitting: Cardiovascular Disease

## 2017-01-21 ENCOUNTER — Ambulatory Visit (INDEPENDENT_AMBULATORY_CARE_PROVIDER_SITE_OTHER): Payer: Commercial Managed Care - HMO | Admitting: Orthopedic Surgery

## 2017-01-28 ENCOUNTER — Encounter (INDEPENDENT_AMBULATORY_CARE_PROVIDER_SITE_OTHER): Payer: Self-pay | Admitting: Orthopedic Surgery

## 2017-01-28 ENCOUNTER — Ambulatory Visit (INDEPENDENT_AMBULATORY_CARE_PROVIDER_SITE_OTHER): Payer: Medicare Other | Admitting: Orthopedic Surgery

## 2017-01-28 VITALS — Ht 67.0 in | Wt 225.0 lb

## 2017-01-28 DIAGNOSIS — M7541 Impingement syndrome of right shoulder: Secondary | ICD-10-CM | POA: Diagnosis not present

## 2017-01-28 DIAGNOSIS — M1712 Unilateral primary osteoarthritis, left knee: Secondary | ICD-10-CM

## 2017-01-28 DIAGNOSIS — M170 Bilateral primary osteoarthritis of knee: Secondary | ICD-10-CM | POA: Insufficient documentation

## 2017-01-28 DIAGNOSIS — M1711 Unilateral primary osteoarthritis, right knee: Secondary | ICD-10-CM

## 2017-01-28 MED ORDER — LIDOCAINE HCL 1 % IJ SOLN
5.0000 mL | INTRAMUSCULAR | Status: AC | PRN
  Administered 2017-01-28: 5 mL

## 2017-01-28 MED ORDER — METHYLPREDNISOLONE ACETATE 40 MG/ML IJ SUSP
40.0000 mg | INTRAMUSCULAR | Status: AC | PRN
  Administered 2017-01-28: 40 mg via INTRA_ARTICULAR

## 2017-01-28 NOTE — Progress Notes (Signed)
Office Visit Note   Patient: Rick Melendez           Date of Birth: 1936/08/12           MRN: [id number] Visit Date: 01/28/2017              Requested by: Anda Kraft, MD [address] [place], [place] [address] PCP: [name], MD  Chief Complaint  Patient presents with  . Left Knee - Pain  . Right Knee - Pain  . Right Shoulder - Pain  . Left Shoulder - Pain  . Neck - Pain    HPI: Both shoulders and both knees are painful per pt for about 7 years. No decreased ROM per pt report but every so often feels an ache. States that at times he has numbness bilaterally down to the hands. He does have a history of ACDF but that surgeon has since retired.  He does have global pain with his knees. States that he has had cortisone injections before in the past and that they have been helpful. At times they are swollen. "i have arthritis everywhere" Pamella Pert, RMA    Assessment & Plan: Visit Diagnoses:  1. Bilateral primary osteoarthritis of knee   2. Impingement syndrome of right shoulder     Plan: Both knees were injected without complications. Discussed that if he gets temporary relief from the knee injections that we could consider hyaluronic acid injection if the wishes to proceed with additional therapy for the knees. Also discussed total knee arthroplasty. Patient is having impingement symptoms of the right shoulder at this time but not that painful he will follow-up in 4 weeks for reevaluation of the right shoulder.  Follow-Up Instructions: Return in about 4 weeks (around 02/25/2017).   Ortho Exam Examination the right shoulder patient has full range of motion but does have pain with Neer's impingement test does have pain to palpation of the biceps tendon. He is alert oriented no adenopathy well-dressed normal affect normal respiratory effort he does have an antalgic gait. Examination of both knees is crepitation with range of motion of both knees class  cruciate are stable there is no effusion no redness no cellulitis. After informed consent sterile prepping both knees were injected.  Imaging: No results found.  Orders:  No orders of the defined types were placed in this encounter.  No orders of the defined types were placed in this encounter.    Procedures: Large Joint Inj Date/Time: 01/28/2017 1:32 PM Performed by: Kelsie Zaborowski V Authorized by: Newt Minion   Consent Given by:  Patient Site marked: the procedure site was marked   Timeout: prior to procedure the correct patient, procedure, and site was verified   Indications:  Pain and diagnostic evaluation Location:  Knee Site:  R knee Prep: patient was prepped and draped in usual sterile fashion   Needle Size:  22 G Needle Length:  1.5 inches Approach:  Anteromedial Ultrasound Guidance: No   Fluoroscopic Guidance: No   Arthrogram: No   Medications:  5 mL lidocaine 1 %; 40 mg methylPREDNISolone acetate 40 MG/ML Aspiration Attempted: No   Patient tolerance:  Patient tolerated the procedure well with no immediate complications Large Joint Inj Date/Time: 01/28/2017 1:32 PM Performed by: Cohen Doleman V Authorized by: Meridee Score V   Consent Given by:  Patient Site marked: the procedure site was marked   Timeout: prior to procedure the correct patient, procedure, and site was verified  Indications:  Pain and diagnostic evaluation Location:  Knee Site:  L knee Prep: patient was prepped and draped in usual sterile fashion   Needle Size:  22 G Needle Length:  1.5 inches Approach:  Anteromedial Ultrasound Guidance: No   Fluoroscopic Guidance: No   Arthrogram: No   Medications:  5 mL lidocaine 1 %; 40 mg methylPREDNISolone acetate 40 MG/ML Aspiration Attempted: No   Patient tolerance:  Patient tolerated the procedure well with no immediate complications    Clinical Data: No additional findings.  Subjective: Review of Systems  Objective: Vital Signs: Ht 5\' 7"   (1.702 m)   Wt 225 lb (102.1 kg)   BMI 35.24 kg/m   Specialty Comments:  No specialty comments available.  PMFS History: Patient Active Problem List   Diagnosis Date Noted  . Bilateral primary osteoarthritis of knee 01/28/2017  . Impingement syndrome of right shoulder 01/28/2017  . CAD - CABG X 2 9/07 LIMA-LAD, SVG-OM. Low risk Myoview 7/12 09/07/2013  . HTN (hypertension) 09/07/2013  . Dyslipidemia 09/07/2013  . Diabetes mellitus ([facility]) 09/07/2013  . Chronic renal insufficiency, stage III (moderate)- SCr 1.6 (Aug 2013) 09/07/2013  . Sleep apnea- compliant with C-pap 09/07/2013  . PVD (peripheral vascular disease)- Rt iliac disease by doppler 7/13 09/07/2013   Past Medical History:  Diagnosis Date  . Coronary artery disease 2007   cabg x3  . Diabetes mellitus without complication ([facility])   . Hyperlipidemia   . Hypertension   . OSA on CPAP    Dr Claiborne Billings - follows and treatmentof sleep apnea  . PVD (peripheral vascular disease) ([facility]) 2003   DR BERRY-  -LEFT LEG STENTING    Family History  Problem Relation Age of Onset  . Cancer Father   . Cancer Brother     Past Surgical History:  Procedure Laterality Date  . CARDIAC CATHETERIZATION  0911/2007   3 vessel CAD WITH LEFT MAIN CAD SEVERE ,norm lLIMA,LV nom. ,evidence for very mild aortic stenosis with gradient, mild stenosis distal abdnminal aotra and proximal left common iliac artery 30%  . CORONARY ARTERY BYPASS GRAFT  08/31/2006   DR GERHARDT---LIMA to LAD, VEIN TO AN OBTUSE MARGINAL BRANCH AND  RIGHT CORONARY ARTERY  . CPET  04/13/2012   normal  . DOPPLER ECHOCARDIOGRAPHY  07/08/2011   EF =>55%  . lower arterial doppler  07/01/2012   mildly abnormal lower extremity;ABIs >1 bilaterally with moderately high-grade right iliac stenosis that had not changed from proir study.  [name] NM MYOCAR PERF WALL MOTION  7/182012   EF 59%, normal myocardial perfusio   Social History   Occupational History  . Not on file.   Social History  Main Topics  . Smoking status: Former Smoker    Types: Cigarettes    Quit date: 09/03/1981  . Smokeless tobacco: Current User    Types: Chew  . Alcohol use Yes  . Drug use: No  . Sexual activity: Not on file

## 2017-02-10 ENCOUNTER — Encounter: Payer: Self-pay | Admitting: Cardiovascular Disease

## 2017-02-10 ENCOUNTER — Ambulatory Visit (INDEPENDENT_AMBULATORY_CARE_PROVIDER_SITE_OTHER): Payer: Medicare Other | Admitting: Cardiovascular Disease

## 2017-02-10 VITALS — BP 130/60 | HR 70 | Ht 67.0 in | Wt 228.0 lb

## 2017-02-10 DIAGNOSIS — I739 Peripheral vascular disease, unspecified: Secondary | ICD-10-CM

## 2017-02-10 DIAGNOSIS — I10 Essential (primary) hypertension: Secondary | ICD-10-CM

## 2017-02-10 DIAGNOSIS — E785 Hyperlipidemia, unspecified: Secondary | ICD-10-CM | POA: Diagnosis not present

## 2017-02-10 DIAGNOSIS — I2510 Atherosclerotic heart disease of native coronary artery without angina pectoris: Secondary | ICD-10-CM | POA: Diagnosis not present

## 2017-02-10 DIAGNOSIS — I2583 Coronary atherosclerosis due to lipid rich plaque: Secondary | ICD-10-CM | POA: Diagnosis not present

## 2017-02-10 DIAGNOSIS — G4733 Obstructive sleep apnea (adult) (pediatric): Secondary | ICD-10-CM | POA: Diagnosis not present

## 2017-02-10 NOTE — Patient Instructions (Signed)
Your physician wants you to follow-up in: 1 year or sooner if needed for sleep. You will receive a reminder letter in the mail two months in advance. If you don't receive a letter, please call our office to schedule the follow-up appointment.  

## 2017-02-12 ENCOUNTER — Telehealth: Payer: Self-pay | Admitting: Cardiovascular Disease

## 2017-02-12 NOTE — Telephone Encounter (Signed)
New Message     Pt needs prescription for a new CPAP machine and notes faxed over from his last doctors appt   Fax information to [facility] in [place]

## 2017-02-12 NOTE — Progress Notes (Signed)
Patient ID: Rick Melendez, male   DOB: [birth year], 81 y.o.   MRN: [id number]     HPI: Rick Melendez is a 81 y.o. male who presents for sleep clinic evaluation.  He is followed by Dr. Gwenlyn Found for cardiology care.  Rick Melendez has a history of hypertension, hyperlipidemia, CAD, and is status post CABG revascularization surgery in September 2007.  He also has a history of peripheral vascular disease.  In May 2013 he was diagnosed as having [facility] and his AHI was 71.3 with significant oxygen desaturation to 81%.  There was evidence for severe snoring and also periodic limb movements.  The patient has been on CPAP therapy since that time and has been using [facility] for his DME.  Apparently, with the recent Medicare contract renewals, he had to switch to [facility] for his DME company.  [facility] is required by [facility] for a face-to-face evaluation in order to start initiating supplies.  When I saw one year ago he was stable and having excellent compliance.  Over the past year, he continues to feel well.  He typically goes to bed 11 - 11:30 PM and wakes up at 6 AM.  He has lost 25 pounds since initiating CPAP therapy.  He has been using a Swift  FX nasal pillow mask.  He has a [employer] [facility] [employer] CPAP unit.  He presently denies any residual daytime sleepiness.  He denies painful restless legs.  He denies hypnogagnic hallucinations.  She is Epworth Sleepiness Scale score was 4.  This year this calculated at 9 as shown below.   Epworth Sleepiness Scale: Situation   Chance of Dozing/Sleeping (0 = never , 1 = slight chance , 2 = moderate chance , 3 = high chance )   sitting and reading 1   watching TV 2   sitting inactive in a public place 1   being a passenger in a motor vehicle for an hour or more 1   lying down in the afternoon 3   sitting and talking to someone 0   sitting quietly after lunch (no alcohol) 1   while stopped  for a few minutes in traffic as the driver 0   Total Score  9   He brought his machine with him to the office today.  He is set at a CPAP pressure of 10 and C-Flex of 3.  For the past 30 days.  He is averaging 5 hours and 36 minutes of sleep per night.  His leak is 10%.  There is 100% use.  His AHI is excellent at 0.7.   Past Medical History:  Diagnosis Date  . Coronary artery disease 2007   cabg x3  . Diabetes mellitus without complication ([facility])   . Hyperlipidemia   . Hypertension   . OSA on CPAP    Dr Claiborne Billings - follows and treatmentof sleep apnea  . PVD (peripheral vascular disease) ([facility]) 2003   DR BERRY-  -LEFT LEG STENTING    Past Surgical History:  Procedure Laterality Date  . CARDIAC CATHETERIZATION  0911/2007   3 vessel CAD WITH LEFT MAIN CAD SEVERE ,norm lLIMA,LV nom. ,evidence for very mild aortic stenosis with gradient, mild stenosis distal abdnminal aotra and proximal left common iliac artery 30%  . CORONARY ARTERY BYPASS GRAFT  08/31/2006   DR GERHARDT---LIMA to LAD, VEIN TO AN OBTUSE MARGINAL BRANCH AND  RIGHT CORONARY ARTERY  . CPET  04/13/2012   normal  . DOPPLER ECHOCARDIOGRAPHY  07/08/2011   EF =>55%  . lower arterial doppler  07/01/2012   mildly abnormal lower extremity;ABIs >1 bilaterally with moderately high-grade right iliac stenosis that had not changed from proir study.  [name] NM MYOCAR PERF WALL MOTION  7/182012   EF 59%, normal myocardial perfusio    No Known Allergies  Current Outpatient Prescriptions  Medication Sig Dispense Refill  . aspirin EC 81 MG tablet Take 81 mg by mouth daily.    . clopidogrel (PLAVIX) 75 MG tablet Take 75 mg by mouth daily.    . Coenzyme Q10 (COQ10) 200 MG CAPS Take 200 mg by mouth daily. 90 capsule 3  . ezetimibe (ZETIA) 10 MG tablet Take 10 mg by mouth daily.    [name] glipiZIDE (GLUCOTROL) 5 MG tablet Take 5 mg by mouth daily.    [name] HYDROcodone-acetaminophen (NORCO/VICODIN) 5-325 MG per tablet Take 1 tablet by mouth 2 (two) times  daily.  0  . lisinopril (PRINIVIL,ZESTRIL) 20 MG tablet Take 20 mg by mouth daily.    . metoprolol tartrate (LOPRESSOR) 25 MG tablet Take 25 mg by mouth daily.    . Omega-3 Fatty Acids (OMEGA-3 FISH OIL) 1200 MG CAPS Take by mouth. Take 4 capsules daily    . omeprazole (PRILOSEC) 20 MG capsule Take 1 capsule by mouth daily.  0  . OVER THE COUNTER MEDICATION USES CPAP NIGHTLY    . rosuvastatin (CRESTOR) 20 MG tablet Take 1 tablet by mouth daily.  0  . triamterene-hydrochlorothiazide (MAXZIDE-25) 37.5-25 MG per tablet Take 1 tablet by mouth daily.     No current facility-administered medications for this visit.     Social History   Social History  . Marital status: Married    Spouse name: N/A  . Number of children: N/A  . Years of education: N/A   Occupational History  . Not on file.   Social History Main Topics  . Smoking status: Former Smoker    Types: Cigarettes    Quit date: 09/03/1981  . Smokeless tobacco: Current User    Types: Chew  . Alcohol use Yes  . Drug use: No  . Sexual activity: Not on file   Other Topics Concern  . Not on file   Social History Narrative  . No narrative on file    Family History  Problem Relation Age of Onset  . Cancer Father   . Cancer Brother      ROS General: Negative; No fevers, chills, or night sweats HEENT: Negative; No changes in vision or hearing, sinus congestion, difficulty swallowing Pulmonary: Negative; No cough, wheezing, shortness of breath, hemoptysis Cardiovascular: Negative; No chest pain, presyncope, syncope, palpatations GI: Negative; No nausea, vomiting, diarrhea, or abdominal pain GU: Negative; No dysuria, hematuria, or difficulty voiding Musculoskeletal: Negative; no myalgias, joint pain, or weakness Hematologic: Negative; no easy bruising, bleeding Endocrine: Negative; no heat/cold intolerance Neuro: Negative; no changes in balance, headaches Skin: Negative; No rashes or skin lesions Psychiatric: Negative; No  behavioral problems, depression Sleep: Positive for obstructive sleep apnea on CPAP therapy; No residual daytime sleepiness, hypersomnolence, bruxism, restless legs, hypnogognic hallucinations, no cataplexy   Physical Exam BP 130/60   Pulse 70   Ht 5\' 7"  (1.702 m)   Wt 228 lb (103.4 kg)   BMI 35.71 kg/m    Repeat blood pressure by me 114/66  Wt Readings from Last 3 Encounters:  02/10/17 228 lb (103.4 kg)  01/28/17 225 lb (102.1 kg)  11/03/16 225 lb (102.1 kg)   General: Alert, oriented, no distress.  Skin: normal turgor, no rashes HEENT: Normocephalic, atraumatic. Pupils round and reactive; sclera anicteric; extraocular muscles intact; Fundi without hemorrhages or exudates Nose without nasal septal hypertrophy Mouth/Parynx benign; Mallinpatti scale 3 Neck: No JVD, no carotid bruits; normal carotid upstroke Lungs: clear to ausculatation and percussion; no wheezing or rales  Chest wall: No tenderness to palpation Heart: RRR, s1 s2 normal; faint 1/6 systolic murmur.  No diastolic murmur.  No rubs thrills or heaves. Abdomen: soft, nontender; no hepatosplenomehaly, BS+; abdominal aorta nontender and not dilated by palpation. Back: No CVA tenderness Pulses 2+ Extremities: no clubbinbg cyanosis or edema, Homan's sign negative  Neurologic: grossly nonfocal; cranial nerves intact. Psychological: Normal affect and mood.   LABS:  No flowsheet data found.   No flowsheet data found.   No flowsheet data found.   Lipid Panel  No results found for: CHOL, TRIG, HDL, CHOLHDL, VLDL, LDLCALC, LDLDIRECT   RADIOLOGY: No results found.  IMPRESSION:  1. OSA (obstructive sleep apnea)   2. Coronary artery disease due to lipid rich plaque   3. Essential hypertension   4. Dyslipidemia   5. PVD (peripheral vascular disease)- Rt iliac disease by doppler 7/13     ASSESSMENT AND PLAN: Rick Melendez is a 81 year old white male who has significant cardiovascular comorbidities  including hypertension, hyperlipidemia, CAD, PVD, and is status post CABG revascularization surgery.  He has had previously documented severe obstructive sleep apnea and has been using CPAP therapy since 2013.  His blood pressure today is stable on his medical regimen consisting of lisinopril 20 g, metoprolol 25 mg, and Maxide.  He continues to take Zetia 10 mg and Crestor 20 g for hyperlipidemia.  He is on dual antiplatelet therapy.  He is diabetic on glipizide.  From a sleep perspective, he is 100% compliant CPAP use.  He is still sleeping inadequate duration and is averaging only 5 hours and 36 minutes of sleep per night.  I suspect this has contributed to his slight increased Epworth Sleepiness Scale score compared to one year ago.  I have suggested optimal sleep duration for to dull at 78 hours.  He has lost weight but remains mildly obese.  Further weight reduction was recommended if possible.  Per Medicare guidelines, he will be seen in one year for reevaluation.   Time spent: 25 minutes  Troy Sine, MD, [facility]  02/12/2017 7:53 PM

## 2017-02-22 NOTE — Telephone Encounter (Signed)
F/U Call:  Rick Melendez is calling states that her husband is eligible for a new CPAP machine, as his current one is broken and out-dated. She states that [facility] in [place] needs a prescription faxed to [phone]. Thanks.

## 2017-02-23 ENCOUNTER — Other Ambulatory Visit: Payer: Self-pay | Admitting: *Deleted

## 2017-02-23 ENCOUNTER — Telehealth: Payer: Self-pay | Admitting: Cardiovascular Disease

## 2017-02-23 DIAGNOSIS — G4733 Obstructive sleep apnea (adult) (pediatric): Secondary | ICD-10-CM

## 2017-02-23 DIAGNOSIS — Z9989 Dependence on other enabling machines and devices: Principal | ICD-10-CM

## 2017-02-23 NOTE — Telephone Encounter (Signed)
New Message   Pt returning phone call Phone call was disconnected. Attempted to recall pt and line was busy

## 2017-02-23 NOTE — Telephone Encounter (Signed)
Spoke with patient's wife regarding his CPAP machine. She informs me that [facility] states they cannot fix the knob on his machine. Requests a order for patient to get new machine. orders placed into the computer.

## 2017-02-23 NOTE — Telephone Encounter (Signed)
Left message returning a call to discuss CPAP replacement .

## 2017-03-02 ENCOUNTER — Telehealth: Payer: Self-pay | Admitting: *Deleted

## 2017-03-02 ENCOUNTER — Ambulatory Visit (INDEPENDENT_AMBULATORY_CARE_PROVIDER_SITE_OTHER): Payer: Medicare Other | Admitting: Orthopedic Surgery

## 2017-03-02 ENCOUNTER — Encounter (INDEPENDENT_AMBULATORY_CARE_PROVIDER_SITE_OTHER): Payer: Self-pay | Admitting: Orthopedic Surgery

## 2017-03-02 VITALS — Ht 67.0 in | Wt 228.0 lb

## 2017-03-02 DIAGNOSIS — M7541 Impingement syndrome of right shoulder: Secondary | ICD-10-CM

## 2017-03-02 DIAGNOSIS — M170 Bilateral primary osteoarthritis of knee: Secondary | ICD-10-CM

## 2017-03-02 MED ORDER — METHYLPREDNISOLONE ACETATE 40 MG/ML IJ SUSP
40.0000 mg | INTRAMUSCULAR | Status: AC | PRN
  Administered 2017-03-02: 40 mg via INTRA_ARTICULAR

## 2017-03-02 MED ORDER — LIDOCAINE HCL 1 % IJ SOLN
5.0000 mL | INTRAMUSCULAR | Status: AC | PRN
  Administered 2017-03-02: 5 mL

## 2017-03-02 NOTE — Telephone Encounter (Signed)
Order placed in epic for patient to get a replacement CPAP machine. Message sent to Rick Melendez with [facility] informing him f this.

## 2017-03-02 NOTE — Progress Notes (Signed)
Office Visit Note   Patient: Rick Melendez           Date of Birth: Feb 21, 1936           MRN: [id number] Visit Date: 03/02/2017              Requested by: Anda Kraft, MD [address] [place], [place] [address] PCP: [name], MD  Chief Complaint  Patient presents with  . Left Knee - Follow-up    4 week follow up  . Right Knee - Follow-up    4 week follow up  . Right Shoulder - Follow-up    Impingement     HPI: Patient is a 81 y.o male who presents today for four week follow up bilateral knee osteoarthritis and right shoulder impingement. He is status post bilateral knee injections 01/28/17 which he did experience some relief, but state his knees are feeling the same today. He is here today for right shoulder injection. Maxcine Ham, RT    Assessment & Plan: Visit Diagnoses:  1. Impingement syndrome of right shoulder   2. Bilateral primary osteoarthritis of knee     Plan: Right shoulder was injected from the posterior portal he tolerated this well he states he is getting less relief from the knee injections. We discussed the possibility of total knee arthroplasty.  Follow-Up Instructions: Return if symptoms worsen or fail to improve.   Ortho Exam On examination patient is alert oriented no adenopathy well-dressed normal affect normal respiratory effort he has an antalgic gait. Examination of right shoulder is pain with Neer and Hawkins impingement test pain with drop arm test. ROS: Complete review of systems negative except as mentioned in the history of present illness. Imaging: No results found.  Labs: No results found for: HGBA1C, ESRSEDRATE, CRP, LABURIC, REPTSTATUS, GRAMSTAIN, CULT, LABORGA  Orders:  Orders Placed This Encounter  Procedures  . Large Joint Injection/Arthrocentesis   No orders of the defined types were placed in this encounter.    Procedures: Large Joint Inj Date/Time: 03/02/2017 1:06 PM Performed by: DUDA,  MARCUS V Authorized by: Newt Minion   Consent Given by:  Patient Site marked: the procedure site was marked   Timeout: prior to procedure the correct patient, procedure, and site was verified   Indications:  Pain and diagnostic evaluation Location:  Shoulder Site:  R subacromial bursa Prep: patient was prepped and draped in usual sterile fashion   Needle Size:  22 G Needle Length:  1.5 inches Ultrasound Guidance: No   Fluoroscopic Guidance: No   Arthrogram: No   Medications:  5 mL lidocaine 1 %; 40 mg methylPREDNISolone acetate 40 MG/ML Aspiration Attempted: No   Patient tolerance:  Patient tolerated the procedure well with no immediate complications    Clinical Data: No additional findings.  Subjective: Review of Systems  Objective: Vital Signs: Ht 5\' 7"  (1.702 m)   Wt 228 lb (103.4 kg)   BMI 35.71 kg/m   Specialty Comments:  No specialty comments available.  PMFS History: Patient Active Problem List   Diagnosis Date Noted  . Bilateral primary osteoarthritis of knee 01/28/2017  . Impingement syndrome of right shoulder 01/28/2017  . CAD - CABG X 2 9/07 LIMA-LAD, SVG-OM. Low risk Myoview 7/12 09/07/2013  . HTN (hypertension) 09/07/2013  . Dyslipidemia 09/07/2013  . Diabetes mellitus ([place]) 09/07/2013  . Chronic renal insufficiency, stage III (moderate)- SCr 1.6 (Aug 2013) 09/07/2013  . Sleep apnea- compliant with C-pap 09/07/2013  . PVD (  peripheral vascular disease)- Rt iliac disease by doppler 7/13 09/07/2013   Past Medical History:  Diagnosis Date  . Coronary artery disease 2007   cabg x3  . Diabetes mellitus without complication ([place])   . Hyperlipidemia   . Hypertension   . OSA on CPAP    Dr Claiborne Billings - follows and treatmentof sleep apnea  . PVD (peripheral vascular disease) ([place]) 2003   DR BERRY-  -LEFT LEG STENTING    Family History  Problem Relation Age of Onset  . Cancer Father   . Cancer Brother     Past Surgical History:  Procedure Laterality  Date  . CARDIAC CATHETERIZATION  0911/2007   3 vessel CAD WITH LEFT MAIN CAD SEVERE ,norm lLIMA,LV nom. ,evidence for very mild aortic stenosis with gradient, mild stenosis distal abdnminal aotra and proximal left common iliac artery 30%  . CORONARY ARTERY BYPASS GRAFT  08/31/2006   DR GERHARDT---LIMA to LAD, VEIN TO AN OBTUSE MARGINAL BRANCH AND  RIGHT CORONARY ARTERY  . CPET  04/13/2012   normal  . DOPPLER ECHOCARDIOGRAPHY  07/08/2011   EF =>55%  . lower arterial doppler  07/01/2012   mildly abnormal lower extremity;ABIs >1 bilaterally with moderately high-grade right iliac stenosis that had not changed from proir study.  [name] NM MYOCAR PERF WALL MOTION  7/182012   EF 59%, normal myocardial perfusio   Social History   Occupational History  . Not on file.   Social History Main Topics  . Smoking status: Former Smoker    Types: Cigarettes    Quit date: 09/03/1981  . Smokeless tobacco: Current User    Types: Chew  . Alcohol use Yes  . Drug use: No  . Sexual activity: Not on file

## 2017-03-03 ENCOUNTER — Telehealth (INDEPENDENT_AMBULATORY_CARE_PROVIDER_SITE_OTHER): Payer: Self-pay | Admitting: Physical Medicine and Rehabilitation

## 2017-03-03 NOTE — Telephone Encounter (Signed)
Scheduled for 03/15/17 at 1000 with driver.

## 2017-03-03 NOTE — Telephone Encounter (Signed)
Yes ok 

## 2017-03-05 ENCOUNTER — Telehealth: Payer: Self-pay | Admitting: *Deleted

## 2017-03-05 ENCOUNTER — Telehealth: Payer: Self-pay | Admitting: Cardiovascular Disease

## 2017-03-05 NOTE — Telephone Encounter (Signed)
Mrs.Maranto is calling about Mr. Rick Melendez and a prescription was supposed to be sent for a new prescription to [facility] and they are telling her they need a face to face . Please call   Thanks

## 2017-03-05 NOTE — Telephone Encounter (Signed)
left message on home VM that the face to face done in February was sent to them however I will re send it.

## 2017-03-05 NOTE — Telephone Encounter (Signed)
Left message stating that I have already sent the February face to face note to them. I will send it again. If she does not hear from them then call me again to notify.

## 2017-03-12 ENCOUNTER — Telehealth: Payer: Self-pay | Admitting: Cardiovascular Disease

## 2017-03-12 NOTE — Telephone Encounter (Signed)
She called and said Rick Melendez said they still have not received the prescription for his C-Pap machine. Would you please refax that again please.

## 2017-03-15 ENCOUNTER — Encounter (INDEPENDENT_AMBULATORY_CARE_PROVIDER_SITE_OTHER): Payer: Self-pay | Admitting: Physical Medicine and Rehabilitation

## 2017-03-15 ENCOUNTER — Ambulatory Visit (INDEPENDENT_AMBULATORY_CARE_PROVIDER_SITE_OTHER): Payer: Medicare Other | Admitting: Physical Medicine and Rehabilitation

## 2017-03-15 ENCOUNTER — Ambulatory Visit (INDEPENDENT_AMBULATORY_CARE_PROVIDER_SITE_OTHER): Payer: Self-pay

## 2017-03-15 VITALS — BP 127/63 | HR 69 | Temp 98.0°F

## 2017-03-15 DIAGNOSIS — M47816 Spondylosis without myelopathy or radiculopathy, lumbar region: Secondary | ICD-10-CM | POA: Diagnosis not present

## 2017-03-15 MED ORDER — LIDOCAINE HCL (PF) 1 % IJ SOLN
0.3300 mL | Freq: Once | INTRAMUSCULAR | Status: AC
  Administered 2017-03-15: 0.3 mL

## 2017-03-15 MED ORDER — METHYLPREDNISOLONE ACETATE 80 MG/ML IJ SUSP
80.0000 mg | Freq: Once | INTRAMUSCULAR | Status: AC
  Administered 2017-03-15: 80 mg

## 2017-03-15 NOTE — Progress Notes (Signed)
Rick Melendez - 81 y.o. male MRN [id number]  Date of birth: [birth year]  Office Visit Note: Visit Date: 03/15/2017 PCP: [name], MD Referred by: Anda Kraft, MD  Subjective: Chief Complaint  Patient presents with  . Lower Back - Pain   HPI: Rick Melendez is a 81 year old male who is accompanied by his wife who provided some of the history. He reports chronic worsening pain across lower back. He also states that the pain is equal right and left. States he had great relief for around 1 month with last injection, which was bilateral L4-5 and L5-S1 facet joint blocks, but then the pain gradually worsened over the next several months. Constant pain but pain level varies from day to day. Pain worse with walking and standing. Some relief with sitting. He denies any paresthesias down the legs or focal weakness. This is become a pattern with him and we've seen him probably 2-3 times a year for the same injection he does well each time. I have talked to him about radiofrequency ablation and they understand the differences. He seemed to do well just with the facet joint blocks and seems to be pretty convinced that he is doing well and is helping him enough and he is functional and is fairly happy. His last MRI was in 2015 and shows significant facet arthropathy particularly at L4-5 at L3-for also at L5-S1. He has no central canal stenosis no focal disc herniations at least on that MRI. His symptoms are classically facet mediated pain. He initially at one point had physical therapy but has not had that recently. He has not had any chiropractic care. He continues to take some over-the-counter medications at times but he reports that the injections helped the most.    ROS Otherwise per HPI.  Assessment & Plan: Visit Diagnoses:  1. Spondylosis without myelopathy or radiculopathy, lumbar region     Plan: Findings:  Bilateral L4-5 and L5-S1 facet joint blocks. Consider regrouping with physical  therapy at looking at radiofrequency ablation in the future. She comments with a history of present illness.    Meds & Orders:  Meds ordered this encounter  Medications  . lidocaine (PF) (XYLOCAINE) 1 % injection 0.3 mL  . methylPREDNISolone acetate (DEPO-MEDROL) injection 80 mg    Orders Placed This Encounter  Procedures  . Facet Injection  . XR C-ARM NO REPORT    Follow-up: Return if symptoms worsen or fail to improve.   Procedures: No procedures performed  Lumbar Facet Joint Intra-Articular Injection(s) with Fluoroscopic Guidance  Patient: Rick Melendez      Date of Birth: [birth year] MRN: [id number] PCP: [name], MD      Visit Date: 03/15/2017   Universal Protocol:    Date/Time: 03/27/185:32 AM  Consent Given By: the patient  Position: PRONE   Additional Comments: Vital signs were monitored before and after the procedure. Patient was prepped and draped in the usual sterile fashion. The correct patient, procedure, and site was verified.   Injection Procedure Details:  Procedure Site One Meds Administered:  Meds ordered this encounter  Medications  . lidocaine (PF) (XYLOCAINE) 1 % injection 0.3 mL  . methylPREDNISolone acetate (DEPO-MEDROL) injection 80 mg     Laterality: Bilateral  Location/Site:  L4-L5 L5-S1  Needle size: 22 guage  Needle type: Spinal  Needle Placement: Articular  Findings:  -Contrast Used: 1 mL iohexol 180 mg iodine/mL   -Comments: Excellent flow of contrast producing a partial arthrogram.  Procedure Details: The fluoroscope  beam is vertically oriented in AP, and the inferior recess is visualized beneath the lower pole of the inferior apophyseal process, which represents the target point for needle insertion. When direct visualization is difficult the target point is located at the medial projection of the vertebral pedicle. The region overlying each aforementioned target is locally anesthetized with a 1 to 2 ml. volume  of 1% Lidocaine without Epinephrine.   The spinal needle was inserted into each of the above mentioned facet joints using biplanar fluoroscopic guidance. A 0.25 to 0.5 ml. volume of Isovue-250 was injected and a partial facet joint arthrogram was obtained. A single spot film was obtained of the resulting arthrogram.    One to 1.25 ml of the steroid/anesthetic solution was then injected into each of the facet joints noted above.   Additional Comments:  The patient tolerated the procedure well Dressing: Band-Aid    Post-procedure details: Patient was observed during the procedure. Post-procedure instructions were reviewed.  Patient left the clinic in stable condition.     Clinical History: L4-5: Advanced facet hypertrophy is present. There is uncovering of a broad-based disc herniation. Moderate lateral recess narrowing is worse on the left. Mild to moderate left and moderate right foraminal stenosis is evident.  L5-S1: A leftward disc protrusion is present. Mild facet hypertrophy is noted. This results in mild left lateral recess narrowing. The foramina are patent.  IMPRESSION: 1. The most significant right-sided disease is at L3-4 and L4-5. 2. Moderate right and mild left foraminal stenosis at L3-4 with mild lateral recess narrowing bilaterally. 3. Advanced facet hypertrophy and spurring, and broad-based disc protrusion, and anterolisthesis leads to moderate lateral recess narrowing at L4-5, worse on the left. 4. Mild to moderate left and moderate right foraminal stenosis at L4-5. 5. Mild left lateral recess narrowing at L5-S1. 6. Mild left lateral recess and moderate left foraminal stenosis at T12-L1. 7. Mild lateral recess narrowing at L1-2 is worse on the left.   Electronically Signed   By: Lawrence Santiago M.D.   On: 05/27/2014 17:56  He reports that he quit smoking about 35 years ago. His smoking use included Cigarettes. His smokeless tobacco use includes Chew. No  results for input(s): HGBA1C, LABURIC in the last 8760 hours.  Objective:  VS:  HT:    WT:   BMI:     BP:127/63  HR:69bpm  TEMP:98 F (36.7 C)( )  RESP:94 % Physical Exam  Musculoskeletal:  The patient ambulates without aid he has consistent pain with extension rotation of the lumbar spine no pain over the greater trochanters and good distal strength.    Ortho Exam Imaging: Xr C-arm No Report  Result Date: 03/15/2017 Please see Notes or Procedures tab for imaging impression.   Past Medical/Family/Surgical/Social History: Medications & Allergies reviewed per EMR Patient Active Problem List   Diagnosis Date Noted  . Bilateral primary osteoarthritis of knee 01/28/2017  . Impingement syndrome of right shoulder 01/28/2017  . CAD - CABG X 2 9/07 LIMA-LAD, SVG-OM. Low risk Myoview 7/12 09/07/2013  . HTN (hypertension) 09/07/2013  . Dyslipidemia 09/07/2013  . Diabetes mellitus ([place]) 09/07/2013  . Chronic renal insufficiency, stage III (moderate)- SCr 1.6 (Aug 2013) 09/07/2013  . Sleep apnea- compliant with C-pap 09/07/2013  . PVD (peripheral vascular disease)- Rt iliac disease by doppler 7/13 09/07/2013   Past Medical History:  Diagnosis Date  . Coronary artery disease 2007   cabg x3  . Diabetes mellitus without complication ([place])   . Hyperlipidemia   .  Hypertension   . OSA on CPAP    Dr Claiborne Billings - follows and treatmentof sleep apnea  . PVD (peripheral vascular disease) ([place]) 2003   DR BERRY-  -LEFT LEG STENTING   Family History  Problem Relation Age of Onset  . Cancer Father   . Cancer Brother    Past Surgical History:  Procedure Laterality Date  . CARDIAC CATHETERIZATION  0911/2007   3 vessel CAD WITH LEFT MAIN CAD SEVERE ,norm lLIMA,LV nom. ,evidence for very mild aortic stenosis with gradient, mild stenosis distal abdnminal aotra and proximal left common iliac artery 30%  . CORONARY ARTERY BYPASS GRAFT  08/31/2006   DR GERHARDT---LIMA to LAD, VEIN TO AN OBTUSE  MARGINAL BRANCH AND  RIGHT CORONARY ARTERY  . CPET  04/13/2012   normal  . DOPPLER ECHOCARDIOGRAPHY  07/08/2011   EF =>55%  . lower arterial doppler  07/01/2012   mildly abnormal lower extremity;ABIs >1 bilaterally with moderately high-grade right iliac stenosis that had not changed from proir study.  [name] NM MYOCAR PERF WALL MOTION  7/182012   EF 59%, normal myocardial perfusio   Social History   Occupational History  . Not on file.   Social History Main Topics  . Smoking status: Former Smoker    Types: Cigarettes    Quit date: 09/03/1981  . Smokeless tobacco: Current User    Types: Chew  . Alcohol use Yes  . Drug use: No  . Sexual activity: Not on file

## 2017-03-15 NOTE — Patient Instructions (Signed)

## 2017-03-15 NOTE — Telephone Encounter (Signed)
Patient notified the order was entered into EPIC on 02/23/17 and was signed by Dr Claiborne Billings on 02/24/17. Staff message sent today by me to Darlina Guys w/[facility] to follow up on this.

## 2017-03-16 NOTE — Procedures (Signed)
Lumbar Facet Joint Intra-Articular Injection(s) with Fluoroscopic Guidance  Patient: Rick Melendez      Date of Birth: 1936-07-28 MRN: [id number] PCP: [name], MD      Visit Date: 03/15/2017   Universal Protocol:    Date/Time: 03/27/185:32 AM  Consent Given By: the patient  Position: PRONE   Additional Comments: Vital signs were monitored before and after the procedure. Patient was prepped and draped in the usual sterile fashion. The correct patient, procedure, and site was verified.   Injection Procedure Details:  Procedure Site One Meds Administered:  Meds ordered this encounter  Medications  . lidocaine (PF) (XYLOCAINE) 1 % injection 0.3 mL  . methylPREDNISolone acetate (DEPO-MEDROL) injection 80 mg     Laterality: Bilateral  Location/Site:  L4-L5 L5-S1  Needle size: 22 guage  Needle type: Spinal  Needle Placement: Articular  Findings:  -Contrast Used: 1 mL iohexol 180 mg iodine/mL   -Comments: Excellent flow of contrast producing a partial arthrogram.  Procedure Details: The fluoroscope beam is vertically oriented in AP, and the inferior recess is visualized beneath the lower pole of the inferior apophyseal process, which represents the target point for needle insertion. When direct visualization is difficult the target point is located at the medial projection of the vertebral pedicle. The region overlying each aforementioned target is locally anesthetized with a 1 to 2 ml. volume of 1% Lidocaine without Epinephrine.   The spinal needle was inserted into each of the above mentioned facet joints using biplanar fluoroscopic guidance. A 0.25 to 0.5 ml. volume of Isovue-250 was injected and a partial facet joint arthrogram was obtained. A single spot film was obtained of the resulting arthrogram.    One to 1.25 ml of the steroid/anesthetic solution was then injected into each of the facet joints noted above.   Additional Comments:  The patient  tolerated the procedure well Dressing: Band-Aid    Post-procedure details: Patient was observed during the procedure. Post-procedure instructions were reviewed.  Patient left the clinic in stable condition.

## 2017-03-19 ENCOUNTER — Observation Stay (HOSPITAL_COMMUNITY)
Admission: EM | Admit: 2017-03-19 | Discharge: 2017-03-20 | Disposition: A | Discharge status: Home / Self Care | Payer: Medicare Other | Attending: Family Medicine | Admitting: Family Medicine

## 2017-03-19 ENCOUNTER — Encounter (HOSPITAL_COMMUNITY): Payer: Self-pay | Admitting: *Deleted

## 2017-03-19 ENCOUNTER — Emergency Department (HOSPITAL_COMMUNITY): Payer: Medicare Other

## 2017-03-19 ENCOUNTER — Observation Stay (HOSPITAL_COMMUNITY): Payer: Medicare Other

## 2017-03-19 DIAGNOSIS — J101 Influenza due to other identified influenza virus with other respiratory manifestations: Secondary | ICD-10-CM | POA: Diagnosis not present

## 2017-03-19 DIAGNOSIS — N281 Cyst of kidney, acquired: Secondary | ICD-10-CM | POA: Insufficient documentation

## 2017-03-19 DIAGNOSIS — N183 Chronic kidney disease, stage 3 unspecified: Secondary | ICD-10-CM | POA: Diagnosis present

## 2017-03-19 DIAGNOSIS — I129 Hypertensive chronic kidney disease with stage 1 through stage 4 chronic kidney disease, or unspecified chronic kidney disease: Secondary | ICD-10-CM | POA: Insufficient documentation

## 2017-03-19 DIAGNOSIS — N179 Acute kidney failure, unspecified: Secondary | ICD-10-CM | POA: Diagnosis not present

## 2017-03-19 DIAGNOSIS — I2510 Atherosclerotic heart disease of native coronary artery without angina pectoris: Secondary | ICD-10-CM | POA: Diagnosis not present

## 2017-03-19 DIAGNOSIS — Z7984 Long term (current) use of oral hypoglycemic drugs: Secondary | ICD-10-CM | POA: Diagnosis not present

## 2017-03-19 DIAGNOSIS — Z7982 Long term (current) use of aspirin: Secondary | ICD-10-CM | POA: Diagnosis not present

## 2017-03-19 DIAGNOSIS — K219 Gastro-esophageal reflux disease without esophagitis: Secondary | ICD-10-CM | POA: Insufficient documentation

## 2017-03-19 DIAGNOSIS — G4733 Obstructive sleep apnea (adult) (pediatric): Secondary | ICD-10-CM | POA: Diagnosis not present

## 2017-03-19 DIAGNOSIS — Z9989 Dependence on other enabling machines and devices: Secondary | ICD-10-CM | POA: Insufficient documentation

## 2017-03-19 DIAGNOSIS — Z79899 Other long term (current) drug therapy: Secondary | ICD-10-CM | POA: Diagnosis not present

## 2017-03-19 DIAGNOSIS — E1151 Type 2 diabetes mellitus with diabetic peripheral angiopathy without gangrene: Secondary | ICD-10-CM | POA: Insufficient documentation

## 2017-03-19 DIAGNOSIS — I959 Hypotension, unspecified: Secondary | ICD-10-CM | POA: Insufficient documentation

## 2017-03-19 DIAGNOSIS — J181 Lobar pneumonia, unspecified organism: Secondary | ICD-10-CM | POA: Diagnosis not present

## 2017-03-19 DIAGNOSIS — E1165 Type 2 diabetes mellitus with hyperglycemia: Secondary | ICD-10-CM | POA: Diagnosis not present

## 2017-03-19 DIAGNOSIS — E785 Hyperlipidemia, unspecified: Secondary | ICD-10-CM | POA: Insufficient documentation

## 2017-03-19 DIAGNOSIS — Z951 Presence of aortocoronary bypass graft: Secondary | ICD-10-CM | POA: Diagnosis not present

## 2017-03-19 DIAGNOSIS — Z87891 Personal history of nicotine dependence: Secondary | ICD-10-CM | POA: Diagnosis not present

## 2017-03-19 DIAGNOSIS — E1122 Type 2 diabetes mellitus with diabetic chronic kidney disease: Secondary | ICD-10-CM | POA: Insufficient documentation

## 2017-03-19 DIAGNOSIS — E119 Type 2 diabetes mellitus without complications: Secondary | ICD-10-CM

## 2017-03-19 DIAGNOSIS — I10 Essential (primary) hypertension: Secondary | ICD-10-CM | POA: Diagnosis present

## 2017-03-19 DIAGNOSIS — Z79891 Long term (current) use of opiate analgesic: Secondary | ICD-10-CM | POA: Diagnosis not present

## 2017-03-19 DIAGNOSIS — R0602 Shortness of breath: Secondary | ICD-10-CM | POA: Diagnosis present

## 2017-03-19 DIAGNOSIS — J189 Pneumonia, unspecified organism: Secondary | ICD-10-CM | POA: Diagnosis present

## 2017-03-19 HISTORY — DX: Unspecified osteoarthritis, unspecified site: M19.90

## 2017-03-19 HISTORY — DX: Influenza due to other identified influenza virus with other respiratory manifestations: J10.1

## 2017-03-19 LAB — BASIC METABOLIC PANEL
Anion gap: 11 (ref 5–15)
BUN: 33 mg/dL — ABNORMAL HIGH (ref 6–20)
CALCIUM: 8.7 mg/dL — AB (ref 8.9–10.3)
CHLORIDE: 100 mmol/L — AB (ref 101–111)
CO2: 23 mmol/L (ref 22–32)
CREATININE: 2.02 mg/dL — AB (ref 0.61–1.24)
GFR, EST AFRICAN AMERICAN: 34 mL/min — AB (ref >60)
GFR, EST NON AFRICAN AMERICAN: 30 mL/min — AB (ref >60)
Glucose, Bld: 177 mg/dL — ABNORMAL HIGH (ref 65–99)
Potassium: 4.8 mmol/L (ref 3.5–5.1)
Sodium: 134 mmol/L — ABNORMAL LOW (ref 135–145)

## 2017-03-19 LAB — CBC WITH DIFFERENTIAL/PLATELET
Basophils Absolute: 0 10*3/uL (ref 0.0–0.1)
Basophils Relative: 0 %
EOS ABS: 0.1 10*3/uL (ref 0.0–0.7)
Eosinophils Relative: 1 %
HCT: 45.6 % (ref 39.0–52.0)
HEMOGLOBIN: 14.3 g/dL (ref 13.0–17.0)
Lymphocytes Relative: 16 %
Lymphs Abs: 1.7 10*3/uL (ref 0.7–4.0)
MCH: 25.8 pg — ABNORMAL LOW (ref 26.0–34.0)
MCHC: 31.4 g/dL (ref 30.0–36.0)
MCV: 82.3 fL (ref 78.0–100.0)
MONOS PCT: 14 %
Monocytes Absolute: 1.5 10*3/uL — ABNORMAL HIGH (ref 0.1–1.0)
NEUTROS PCT: 69 %
Neutro Abs: 7.5 10*3/uL (ref 1.7–7.7)
PLATELETS: 177 10*3/uL (ref 150–400)
RBC: 5.54 MIL/uL (ref 4.22–5.81)
RDW: 15.7 % — ABNORMAL HIGH (ref 11.5–15.5)
WBC: 10.9 10*3/uL — AB (ref 4.0–10.5)

## 2017-03-19 LAB — INFLUENZA PANEL BY PCR (TYPE A & B)
INFLBPCR: POSITIVE — AB
Influenza A By PCR: NEGATIVE

## 2017-03-19 LAB — GLUCOSE, CAPILLARY
GLUCOSE-CAPILLARY: 145 mg/dL — AB (ref 65–99)
GLUCOSE-CAPILLARY: 102 mg/dL — AB (ref 65–99)

## 2017-03-19 LAB — CREATININE, URINE, RANDOM: Creatinine, Urine: 71.65 mg/dL

## 2017-03-19 LAB — MAGNESIUM: MAGNESIUM: 1.5 mg/dL — AB (ref 1.7–2.4)

## 2017-03-19 LAB — I-STAT CG4 LACTIC ACID, ED: Lactic Acid, Venous: 1.65 mmol/L (ref 0.5–1.9)

## 2017-03-19 LAB — I-STAT TROPONIN, ED: TROPONIN I, POC: 0 ng/mL (ref 0.00–0.08)

## 2017-03-19 LAB — PHOSPHORUS: Phosphorus: 4.7 mg/dL — ABNORMAL HIGH (ref 2.5–4.6)

## 2017-03-19 MED ORDER — ENOXAPARIN SODIUM 40 MG/0.4ML ~~LOC~~ SOLN
40.0000 mg | SUBCUTANEOUS | Status: DC
  Administered 2017-03-19: 40 mg via SUBCUTANEOUS
  Filled 2017-03-19: qty 0.4

## 2017-03-19 MED ORDER — SODIUM CHLORIDE 0.9 % IV BOLUS (SEPSIS)
1000.0000 mL | Freq: Once | INTRAVENOUS | Status: AC
  Administered 2017-03-19: 1000 mL via INTRAVENOUS

## 2017-03-19 MED ORDER — CLOPIDOGREL BISULFATE 75 MG PO TABS
75.0000 mg | ORAL_TABLET | Freq: Every day | ORAL | Status: DC
  Administered 2017-03-20: 75 mg via ORAL
  Filled 2017-03-19: qty 1

## 2017-03-19 MED ORDER — ASPIRIN EC 81 MG PO TBEC
81.0000 mg | DELAYED_RELEASE_TABLET | Freq: Every day | ORAL | Status: DC
  Administered 2017-03-20: 81 mg via ORAL
  Filled 2017-03-19: qty 1

## 2017-03-19 MED ORDER — ALBUTEROL SULFATE (2.5 MG/3ML) 0.083% IN NEBU: 2.5000 mg | INHALATION_SOLUTION | RESPIRATORY_TRACT | Status: DC | PRN

## 2017-03-19 MED ORDER — OSELTAMIVIR PHOSPHATE 30 MG PO CAPS
30.0000 mg | ORAL_CAPSULE | Freq: Two times a day (BID) | ORAL | Status: DC
  Administered 2017-03-19 – 2017-03-20 (×2): 30 mg via ORAL
  Filled 2017-03-19 (×3): qty 1

## 2017-03-19 MED ORDER — OSELTAMIVIR PHOSPHATE 75 MG PO CAPS: 75.0000 mg | ORAL_CAPSULE | Freq: Two times a day (BID) | ORAL | Status: DC

## 2017-03-19 MED ORDER — SODIUM CHLORIDE 0.9 % IV SOLN
INTRAVENOUS | Status: AC
  Administered 2017-03-19: 19:00:00 via INTRAVENOUS

## 2017-03-19 MED ORDER — ACETAMINOPHEN 325 MG PO TABS
650.0000 mg | ORAL_TABLET | Freq: Four times a day (QID) | ORAL | Status: DC | PRN
  Administered 2017-03-20: 650 mg via ORAL
  Filled 2017-03-19: qty 2

## 2017-03-19 MED ORDER — IPRATROPIUM-ALBUTEROL 0.5-2.5 (3) MG/3ML IN SOLN
3.0000 mL | Freq: Four times a day (QID) | RESPIRATORY_TRACT | Status: DC
  Administered 2017-03-19 – 2017-03-20 (×3): 3 mL via RESPIRATORY_TRACT
  Filled 2017-03-19 (×3): qty 3

## 2017-03-19 MED ORDER — ONDANSETRON HCL 4 MG PO TABS: 4.0000 mg | ORAL_TABLET | Freq: Four times a day (QID) | ORAL | Status: DC | PRN

## 2017-03-19 MED ORDER — EZETIMIBE 10 MG PO TABS
10.0000 mg | ORAL_TABLET | Freq: Every day | ORAL | Status: DC
  Administered 2017-03-20: 10 mg via ORAL
  Filled 2017-03-19: qty 1

## 2017-03-19 MED ORDER — GUAIFENESIN ER 600 MG PO TB12
600.0000 mg | ORAL_TABLET | Freq: Two times a day (BID) | ORAL | Status: DC
  Administered 2017-03-19 – 2017-03-20 (×3): 600 mg via ORAL
  Filled 2017-03-19 (×3): qty 1

## 2017-03-19 MED ORDER — ROSUVASTATIN CALCIUM 10 MG PO TABS
10.0000 mg | ORAL_TABLET | Freq: Every day | ORAL | Status: DC
  Administered 2017-03-19: 10 mg via ORAL
  Filled 2017-03-19 (×2): qty 1

## 2017-03-19 MED ORDER — LISINOPRIL 20 MG PO TABS: 20.0000 mg | ORAL_TABLET | Freq: Every day | ORAL | Status: DC

## 2017-03-19 MED ORDER — AZITHROMYCIN 250 MG PO TABS
250.0000 mg | ORAL_TABLET | ORAL | Status: DC
  Administered 2017-03-20: 250 mg via ORAL
  Filled 2017-03-19: qty 1

## 2017-03-19 MED ORDER — DEXTROSE 5 % IV SOLN
500.0000 mg | Freq: Once | INTRAVENOUS | Status: AC
  Administered 2017-03-19: 500 mg via INTRAVENOUS
  Filled 2017-03-19: qty 500

## 2017-03-19 MED ORDER — DEXTROSE 5 % IV SOLN
1.0000 g | INTRAVENOUS | Status: DC
  Filled 2017-03-19: qty 10

## 2017-03-19 MED ORDER — INSULIN ASPART 100 UNIT/ML ~~LOC~~ SOLN
0.0000 [IU] | Freq: Three times a day (TID) | SUBCUTANEOUS | Status: DC
  Administered 2017-03-19: 3 [IU] via SUBCUTANEOUS
  Administered 2017-03-20: 4 [IU] via SUBCUTANEOUS

## 2017-03-19 MED ORDER — TRIAMTERENE-HCTZ 37.5-25 MG PO TABS
1.0000 | ORAL_TABLET | Freq: Every day | ORAL | Status: DC
  Filled 2017-03-19: qty 1

## 2017-03-19 MED ORDER — PANTOPRAZOLE SODIUM 40 MG PO TBEC
40.0000 mg | DELAYED_RELEASE_TABLET | Freq: Every day | ORAL | Status: DC
  Administered 2017-03-20: 40 mg via ORAL
  Filled 2017-03-19: qty 1

## 2017-03-19 MED ORDER — METOPROLOL TARTRATE 25 MG PO TABS
25.0000 mg | ORAL_TABLET | Freq: Every day | ORAL | Status: DC
  Filled 2017-03-19: qty 1

## 2017-03-19 MED ORDER — ACETAMINOPHEN 650 MG RE SUPP: 650.0000 mg | Freq: Four times a day (QID) | RECTAL | Status: DC | PRN

## 2017-03-19 MED ORDER — ONDANSETRON HCL 4 MG/2ML IJ SOLN: 4.0000 mg | Freq: Four times a day (QID) | INTRAMUSCULAR | Status: DC | PRN

## 2017-03-19 MED ORDER — HYDRALAZINE HCL 20 MG/ML IJ SOLN: 10.0000 mg | Freq: Three times a day (TID) | INTRAMUSCULAR | Status: DC | PRN

## 2017-03-19 MED ORDER — DEXTROSE 5 % IV SOLN
1.0000 g | Freq: Once | INTRAVENOUS | Status: AC
  Administered 2017-03-19: 1 g via INTRAVENOUS
  Filled 2017-03-19: qty 10

## 2017-03-19 NOTE — ED Provider Notes (Addendum)
[facility] DEPT Provider Note   CSN: [id number] Arrival date & time: 03/19/17  1237     History   Chief Complaint Chief Complaint  Patient presents with  . Shortness of Breath    HPI Rick Melendez is a 81 y.o. male.Complains of cough productive of gray sputum and subjective fever and sweatiness onset 2 days ago. He went to a clinic earlier today was treated with one sublingual nitroglycerin as clinic felt he was in congestive heart failure EMS was called. Blood pressure was noted to be approximate 70 systolic upon EMSs arrival. EMS treated patient with CPAP and with 2 nebulized treatments with partial reliefHPI no other associated symptoms and no nausea or vomiting.. No other associated symptoms. Nothing made symptoms better or worse. Past Medical History:  Diagnosis Date  . Coronary artery disease 2007   cabg x3  . Diabetes mellitus without complication ([place])   . Hyperlipidemia   . Hypertension   . OSA on CPAP    Dr Claiborne Billings - follows and treatmentof sleep apnea  . PVD (peripheral vascular disease) ([place]) 2003   DR BERRY-  -LEFT LEG STENTING    Patient Active Problem List   Diagnosis Date Noted  . CAP (community acquired pneumonia) 03/19/2017  . Bilateral primary osteoarthritis of knee 01/28/2017  . Impingement syndrome of right shoulder 01/28/2017  . CAD - CABG X 2 9/07 LIMA-LAD, SVG-OM. Low risk Myoview 7/12 09/07/2013  . HTN (hypertension) 09/07/2013  . Dyslipidemia 09/07/2013  . Diabetes mellitus ([place]) 09/07/2013  . Chronic renal insufficiency, stage III (moderate)- SCr 1.6 (Aug 2013) 09/07/2013  . Sleep apnea- compliant with C-pap 09/07/2013  . PVD (peripheral vascular disease)- Rt iliac disease by doppler 7/13 09/07/2013    Past Surgical History:  Procedure Laterality Date  . CARDIAC CATHETERIZATION  0911/2007   3 vessel CAD WITH LEFT MAIN CAD SEVERE ,norm lLIMA,LV nom. ,evidence for very mild aortic stenosis with gradient, mild stenosis distal abdnminal aotra  and proximal left common iliac artery 30%  . CORONARY ARTERY BYPASS GRAFT  08/31/2006   DR GERHARDT---LIMA to LAD, VEIN TO AN OBTUSE MARGINAL BRANCH AND  RIGHT CORONARY ARTERY  . CPET  04/13/2012   normal  . DOPPLER ECHOCARDIOGRAPHY  07/08/2011   EF =>55%  . lower arterial doppler  07/01/2012   mildly abnormal lower extremity;ABIs >1 bilaterally with moderately high-grade right iliac stenosis that had not changed from proir study.  [name] NM MYOCAR PERF WALL MOTION  7/182012   EF 59%, normal myocardial perfusio       Home Medications    Prior to Admission medications   Medication Sig Start Date End Date Taking? Authorizing Provider  aspirin EC 81 MG tablet Take 81 mg by mouth daily.   Yes Historical Provider, MD  clopidogrel (PLAVIX) 75 MG tablet Take 75 mg by mouth daily.   Yes Historical Provider, MD  Coenzyme Q10 (COQ10) 200 MG CAPS Take 200 mg by mouth daily. 09/07/13  Yes Luke K Kilroy, PA-C  ezetimibe (ZETIA) 10 MG tablet Take 10 mg by mouth daily.   Yes Historical Provider, MD  glipiZIDE (GLUCOTROL) 5 MG tablet Take 5 mg by mouth daily.   Yes Historical Provider, MD  HYDROcodone-acetaminophen (NORCO/VICODIN) 5-325 MG per tablet Take 1 tablet by mouth 2 (two) times daily. 03/07/15  Yes Historical Provider, MD  lisinopril (PRINIVIL,ZESTRIL) 20 MG tablet Take 20 mg by mouth daily.   Yes Historical Provider, MD  metoprolol tartrate (LOPRESSOR) 25 MG tablet Take 25 mg by  mouth daily.   Yes Historical Provider, MD  Omega-3 Fatty Acids (OMEGA-3 FISH OIL) 1200 MG CAPS Take 2,400 mg by mouth 2 (two) times daily. Take 4 capsules daily    Yes Historical Provider, MD  omeprazole (PRILOSEC) 20 MG capsule Take 1 capsule by mouth daily. 04/10/16  Yes Historical Provider, MD  OVER THE COUNTER MEDICATION USES CPAP NIGHTLY   Yes Historical Provider, MD  rosuvastatin (CRESTOR) 10 MG tablet Take 10 mg by mouth at bedtime. 03/05/17  Yes Historical Provider, MD  triamterene-hydrochlorothiazide (MAXZIDE-25)  37.5-25 MG per tablet Take 1 tablet by mouth daily.   Yes Historical Provider, MD    Family History Family History  Problem Relation Age of Onset  . Cancer Father   . Cancer Brother     Social History Social History  Substance Use Topics  . Smoking status: Former Smoker    Types: Cigarettes    Quit date: 09/03/1981  . Smokeless tobacco: Current User    Types: Chew  . Alcohol use Yes     Allergies   Patient has no known allergies.   Review of Systems Review of Systems  Constitutional: Positive for diaphoresis and fever.       Subjective fever  HENT: Negative.   Respiratory: Positive for cough.   Cardiovascular: Negative.   Gastrointestinal: Negative.   Musculoskeletal: Negative.   Skin: Negative.   Allergic/Immunologic: Positive for immunocompromised state.       Diabetic  Neurological: Negative.   Psychiatric/Behavioral: Negative.   All other systems reviewed and are negative.    Physical Exam Updated Vital Signs BP (!) 100/55   Pulse 65   Temp 99.7 F (37.6 C) (Rectal)   Resp (!) 24   SpO2 98%   Physical Exam  Constitutional: No distress.  Chronically ill-appearing  HENT:  Head: Normocephalic and atraumatic.  Eyes: Conjunctivae are normal. Pupils are equal, round, and reactive to light.  Neck: Neck supple. No tracheal deviation present. No thyromegaly present.  Cardiovascular: Normal rate and regular rhythm.   No murmur heard. Pulmonary/Chest: Effort normal and breath sounds normal.  Abdominal: Soft. Bowel sounds are normal. He exhibits no distension. There is no tenderness.  Musculoskeletal: Normal range of motion. He exhibits no edema or tenderness.  Neurological: He is alert. Coordination normal.  Skin: Skin is warm and dry. No rash noted.  Psychiatric: He has a normal mood and affect.  Nursing note and vitals reviewed.    ED Treatments / Results  Labs (all labs ordered are listed, but only abnormal results are displayed) Labs Reviewed    CBC WITH DIFFERENTIAL/PLATELET - Abnormal; Notable for the following:       Result Value   WBC 10.9 (*)    MCH 25.8 (*)    RDW 15.7 (*)    Monocytes Absolute 1.5 (*)    All other components within normal limits  BASIC METABOLIC PANEL - Abnormal; Notable for the following:    Sodium 134 (*)    Chloride 100 (*)    Glucose, Bld 177 (*)    BUN 33 (*)    Creatinine, Ser 2.02 (*)    Calcium 8.7 (*)    GFR calc non Af Amer 30 (*)    GFR calc Af Amer 34 (*)    All other components within normal limits  CULTURE, BLOOD (ROUTINE X 2)  CULTURE, BLOOD (ROUTINE X 2)  INFLUENZA PANEL BY PCR (TYPE A & B)  I-STAT TROPOININ, ED  I-STAT CG4 LACTIC ACID, ED  Chest x-ray viewed by me  EKG  EKG Interpretation  Date/Time:  Friday March 19 2017 12:44:11 EDT Ventricular Rate:  70 PR Interval:    QRS Duration: 105 QT Interval:  407 QTC Calculation: 440 R Axis:   7 Text Interpretation:  Sinus rhythm Atrial premature complex Borderline low voltage, extremity leads No significant change since last tracing Confirmed by Winfred Leeds  MD, Jaleil Renwick [phone]) on 03/19/2017 1:46:07 PM       Radiology Dg Chest 2 View  Result Date: 03/19/2017 CLINICAL DATA:  To ED via [facility] for eval of increased sob, cough, congestion, and dizziness. [facility]. Hx pneumonia and flu like symptoms. Hx of CAD, diabetes, HTN, PVD, CABG AND cardiac cath 08/2006. Past-Smoker (Quit 08/1981) EXAM: CHEST  2 VIEW COMPARISON:  06/15/2016 FINDINGS: Sternotomy wires overlie stable cardiac silhouette there is chronic bronchitic markings the lungs. There is increase in interstitial thickening in the lingula. Degenerative osteophytosis of the spine. IMPRESSION: Increased interstitial markings in the lingula concerning for pneumonia versus bronchitis. Electronically Signed   By: Suzy Bouchard M.D.   On: 03/19/2017 13:59    Procedures Procedures (including critical care time)  Medications Ordered in ED Medications   cefTRIAXone (ROCEPHIN) 1 g in dextrose 5 % 50 mL IVPB (not administered)  azithromycin (ZITHROMAX) 500 mg in dextrose 5 % 250 mL IVPB (not administered)  sodium chloride 0.9 % bolus 1,000 mL (not administered)  sodium chloride 0.9 % bolus 1,000 mL (0 mLs Intravenous Stopped 03/19/17 1339)    Initial impression :Patient immunocompromise. Does have history of chronic kidney disease however no old renal function noted in old records for comparison. 2:25 PM resting comfortably after treatment with intravenous hydration. IV antibiotic and blood cultures ordered. Consulted hospitalist service. We'll treat for community acquired pneumonia  Initial Impression / Assessment and Plan / ED Course  I have reviewed the triage vital signs and the nursing notes.  Pertinent labs & imaging results that were available during my care of the patient were reviewed by me and considered in my medical decision making (see chart for details).     Hospitalist to arrange for overnight stay. Treatment with IV fluids, IV antibiotics   Final Clinical Impressions(s) / ED Diagnoses   diagnoses #1 community-acquired pneumonia  #2 hypotension  #3 hyperglycemia  #4 renal insufficiency  Final diagnoses:  None    New Prescriptions New Prescriptions   No medications on file     Orlie Dakin, MD 03/19/17 [place], MD 03/19/17 1428

## 2017-03-19 NOTE — ED Triage Notes (Signed)
To ED via [facility] for eval of increased sob. [facility]. Hx pneumonia and flu like symptoms. Pt placed on CPAP enroute due to increased rhonchi, sob. Initial sats 90% and bp 90/45 and at one point 80/45. Arrived to ED alert and oriented. Removed CPAP on arrival. Speaking in full complete sentences, w/d.

## 2017-03-19 NOTE — H&P (Signed)
[facility] History and Physical  LEMOND GRIFFEE KXF:[id number] DOB: 07/13/1936 DOA: 03/19/2017  Referring physician:  PCP: [name], MD   Chief Complaint: "He just kept hacking and coughing."  HPI: Rick Melendez is a 81 y.o. male  with past medical history of coronary artery disease, diabetes, hypertension, hyperlipidemia, sleep apnea and peripheral vascular disease presents with shortness of breath and cough. Patient started off with cold symptoms roughly a week ago. Cold symptoms overtime developed into cough that was productive with feelings of congestion in the chest. Patient also had episodes of sweating and chills. No fever. Wife is sick contact. Patient tried to see his primary care doctor today but the office was closed. Patient was seen in urgent care for evaluation and was advised to go to the emergency room due to his abnormal breath sounds on auscultation. Of note patient did get a dose of nitroglycerin for presumed pulmonary edema and urgent care.  ED course: Chest x-ray showed possible pneumonia. Start Rocephin and Zithromax. Hospitalist consult for admit.  Review of Systems:  As per HPI otherwise 10 point review of systems negative.    Past Medical History:  Diagnosis Date  . Coronary artery disease 2007   cabg x3  . Diabetes mellitus without complication ([place])   . Hyperlipidemia   . Hypertension   . OSA on CPAP    Dr Claiborne Billings - follows and treatmentof sleep apnea  . PVD (peripheral vascular disease) ([place]) 2003   DR BERRY-  -LEFT LEG STENTING   Past Surgical History:  Procedure Laterality Date  . CARDIAC CATHETERIZATION  0911/2007   3 vessel CAD WITH LEFT MAIN CAD SEVERE ,norm lLIMA,LV nom. ,evidence for very mild aortic stenosis with gradient, mild stenosis distal abdnminal aotra and proximal left common iliac artery 30%  . CORONARY ARTERY BYPASS GRAFT  08/31/2006   DR GERHARDT---LIMA to LAD, VEIN TO AN OBTUSE MARGINAL BRANCH AND  RIGHT CORONARY  ARTERY  . CPET  04/13/2012   normal  . DOPPLER ECHOCARDIOGRAPHY  07/08/2011   EF =>55%  . lower arterial doppler  07/01/2012   mildly abnormal lower extremity;ABIs >1 bilaterally with moderately high-grade right iliac stenosis that had not changed from proir study.  [name] NM MYOCAR PERF WALL MOTION  7/182012   EF 59%, normal myocardial perfusio   Social History:  reports that he quit smoking about 35 years ago. His smoking use included Cigarettes. His smokeless tobacco use includes Chew. He reports that he drinks alcohol. He reports that he does not use drugs.  No Known Allergies  Family History  Problem Relation Age of Onset  . Cancer Father   . Cancer Brother      Prior to Admission medications   Medication Sig Start Date End Date Taking? Authorizing Provider  aspirin EC 81 MG tablet Take 81 mg by mouth daily.   Yes Historical Provider, MD  clopidogrel (PLAVIX) 75 MG tablet Take 75 mg by mouth daily.   Yes Historical Provider, MD  Coenzyme Q10 (COQ10) 200 MG CAPS Take 200 mg by mouth daily. 09/07/13  Yes Luke K Kilroy, PA-C  ezetimibe (ZETIA) 10 MG tablet Take 10 mg by mouth daily.   Yes Historical Provider, MD  glipiZIDE (GLUCOTROL) 5 MG tablet Take 5 mg by mouth daily.   Yes Historical Provider, MD  HYDROcodone-acetaminophen (NORCO/VICODIN) 5-325 MG per tablet Take 1 tablet by mouth 2 (two) times daily. 03/07/15  Yes Historical Provider, MD  lisinopril (PRINIVIL,ZESTRIL) 20 MG tablet Take 20 mg  by mouth daily.   Yes Historical Provider, MD  metoprolol tartrate (LOPRESSOR) 25 MG tablet Take 25 mg by mouth daily.   Yes Historical Provider, MD  Omega-3 Fatty Acids (OMEGA-3 FISH OIL) 1200 MG CAPS Take 2,400 mg by mouth 2 (two) times daily. Take 4 capsules daily    Yes Historical Provider, MD  omeprazole (PRILOSEC) 20 MG capsule Take 1 capsule by mouth daily. 04/10/16  Yes Historical Provider, MD  OVER THE COUNTER MEDICATION USES CPAP NIGHTLY   Yes Historical Provider, MD  rosuvastatin  (CRESTOR) 10 MG tablet Take 10 mg by mouth at bedtime. 03/05/17  Yes Historical Provider, MD  triamterene-hydrochlorothiazide (MAXZIDE-25) 37.5-25 MG per tablet Take 1 tablet by mouth daily.   Yes Historical Provider, MD   Physical Exam: Vitals:   03/19/17 1247 03/19/17 1248 03/19/17 1315 03/19/17 1330  BP: 111/79 104/69 103/61 (!) 100/55  Pulse: 69  68 65  Resp:   (!) 25 (!) 24  Temp: 99.7 F (37.6 C)     TempSrc: Rectal     SpO2: 91%  98% 98%    Wt Readings from Last 3 Encounters:  03/02/17 103.4 kg (228 lb)  02/10/17 103.4 kg (228 lb)  01/28/17 102.1 kg (225 lb)    General:  Appears calm and comfortable, Alert and oriented 3, weakness Eyes:  PERRL, EOMI, normal lids, iris ENT:  grossly normal hearing, lips & tongue Neck:  no LAD, masses or thyromegaly Cardiovascular:  RRR, no m/r/g. No LE edema.  Respiratory:  Tachypnea, increased work of breathing, wheezes, decreased breath sounds Abdomen:  soft, ntnd Skin:  no rash or induration seen on limited exam Musculoskeletal:  grossly normal tone BUE/BLE Psychiatric:  grossly normal mood and affect, speech fluent and appropriate Neurologic:  CN 2-12 grossly intact, moves all extremities in coordinated fashion.          Labs on Admission:  Basic Metabolic Panel:  Recent Labs Lab 03/19/17 1247  NA 134*  K 4.8  CL 100*  CO2 23  GLUCOSE 177*  BUN 33*  CREATININE 2.02*  CALCIUM 8.7*   Liver Function Tests: No results for input(s): AST, ALT, ALKPHOS, BILITOT, PROT, ALBUMIN in the last 168 hours. No results for input(s): LIPASE, AMYLASE in the last 168 hours. No results for input(s): AMMONIA in the last 168 hours. CBC:  Recent Labs Lab 03/19/17 1247  WBC 10.9*  NEUTROABS 7.5  HGB 14.3  HCT 45.6  MCV 82.3  PLT 177   Cardiac Enzymes: No results for input(s): CKTOTAL, CKMB, CKMBINDEX, TROPONINI in the last 168 hours.  BNP (last 3 results) No results for input(s): BNP in the last 8760 hours.  ProBNP (last 3  results) No results for input(s): PROBNP in the last 8760 hours.   Creatinine clearance cannot be calculated (Unknown ideal weight.)  CBG: No results for input(s): GLUCAP in the last 168 hours.  Radiological Exams on Admission: Dg Chest 2 View  Result Date: 03/19/2017 CLINICAL DATA:  To ED via [facility] for eval of increased sob, cough, congestion, and dizziness. [facility]. Hx pneumonia and flu like symptoms. Hx of CAD, diabetes, HTN, PVD, CABG AND cardiac cath 08/2006. Past-Smoker (Quit 08/1981) EXAM: CHEST  2 VIEW COMPARISON:  06/15/2016 FINDINGS: Sternotomy wires overlie stable cardiac silhouette there is chronic bronchitic markings the lungs. There is increase in interstitial thickening in the lingula. Degenerative osteophytosis of the spine. IMPRESSION: Increased interstitial markings in the lingula concerning for pneumonia versus bronchitis. Electronically Signed  By: Suzy Bouchard M.D.   On: 03/19/2017 13:59    EKG: Independently reviewed. No STEMI  Assessment/Plan Principal Problem:   CAP (community acquired pneumonia) Active Problems:   HTN (hypertension)   Diabetes mellitus ([place])   Chronic renal insufficiency, stage III (moderate)- SCr 1.6 (Aug 2013)  CAP Scheduled DuoNeb's When necessary albuterol Antibiotic- azithro and rocpehin Oxygen therapy Continuous pulse oximetry Flutter valve IS Mucinex Sputum cult Blood cult x2  Hypertension When necessary hydralazine 10 mg IV as needed for severe blood pressure Continue Lopressor and lisinopril Hold maxide and lisnopril  AKI/CKD III Working patient up because baseline creatinine is unknown Baseline Cr unk , Cr on admit 2.0 2L of normal saline given in the emergency room Gentle hydration overnight Checking magnesium and phosphorus Urine labs ordered to calculate fractional excretion of urea PVR I&O  Hyperlipidemia Continue statin, zetia  GERD Cont PPI  CAD Cont plavix and  asa  DM SSI Hold glipizide  Code Status: FULL DVT Prophylaxis: Lovenox Family Communication: wife at bedside Disposition Plan: Pending Improvement  Status: obs medsurg  Elwin Mocha, MD Family Medicine [facility] [email or web address] Password [email or web address]

## 2017-03-19 NOTE — Progress Notes (Signed)
Pharmacy Antibiotic Note  Rick Melendez is a 81 y.o. male admitted on 03/19/2017 with influenza positive.  Pharmacy has been consulted for Tamiflu dosing. CrCl is borderline 30-31 mL/min.  Plan: Start Tamiflu 30 mg PO BID x 5 days Monitor renal function     Temp (24hrs), Avg:99.3 F (37.4 C), Min:98.8 F (37.1 C), Max:99.7 F (37.6 C)   Recent Labs Lab 03/19/17 1247 03/19/17 1328  WBC 10.9*  --   CREATININE 2.02*  --   LATICACIDVEN  --  1.65    CrCl cannot be calculated (Unknown ideal weight.).    No Known Allergies  Thank you for allowing [place] to participate in this patients care. Jens Som, PharmD Pager: [phone]

## 2017-03-20 DIAGNOSIS — I10 Essential (primary) hypertension: Secondary | ICD-10-CM

## 2017-03-20 DIAGNOSIS — E119 Type 2 diabetes mellitus without complications: Secondary | ICD-10-CM | POA: Diagnosis not present

## 2017-03-20 DIAGNOSIS — J101 Influenza due to other identified influenza virus with other respiratory manifestations: Secondary | ICD-10-CM | POA: Diagnosis not present

## 2017-03-20 DIAGNOSIS — N179 Acute kidney failure, unspecified: Secondary | ICD-10-CM

## 2017-03-20 LAB — BASIC METABOLIC PANEL
ANION GAP: 10 (ref 5–15)
BUN: 30 mg/dL — ABNORMAL HIGH (ref 6–20)
CHLORIDE: 104 mmol/L (ref 101–111)
CO2: 23 mmol/L (ref 22–32)
Calcium: 8.1 mg/dL — ABNORMAL LOW (ref 8.9–10.3)
Creatinine, Ser: 1.52 mg/dL — ABNORMAL HIGH (ref 0.61–1.24)
GFR calc non Af Amer: 42 mL/min — ABNORMAL LOW (ref >60)
GFR, EST AFRICAN AMERICAN: 48 mL/min — AB (ref >60)
Glucose, Bld: 125 mg/dL — ABNORMAL HIGH (ref 65–99)
POTASSIUM: 4.3 mmol/L (ref 3.5–5.1)
SODIUM: 137 mmol/L (ref 135–145)

## 2017-03-20 LAB — CBC
HEMATOCRIT: 40.3 % (ref 39.0–52.0)
HEMOGLOBIN: 12.4 g/dL — AB (ref 13.0–17.0)
MCH: 25.3 pg — ABNORMAL LOW (ref 26.0–34.0)
MCHC: 30.8 g/dL (ref 30.0–36.0)
MCV: 82.2 fL (ref 78.0–100.0)
PLATELETS: 146 10*3/uL — AB (ref 150–400)
RBC: 4.9 MIL/uL (ref 4.22–5.81)
RDW: 15.7 % — ABNORMAL HIGH (ref 11.5–15.5)
WBC: 8.4 10*3/uL (ref 4.0–10.5)

## 2017-03-20 LAB — GLUCOSE, CAPILLARY
GLUCOSE-CAPILLARY: 173 mg/dL — AB (ref 65–99)
GLUCOSE-CAPILLARY: 195 mg/dL — AB (ref 65–99)
Glucose-Capillary: 119 mg/dL — ABNORMAL HIGH (ref 65–99)

## 2017-03-20 MED ORDER — IPRATROPIUM-ALBUTEROL 0.5-2.5 (3) MG/3ML IN SOLN
3.0000 mL | Freq: Three times a day (TID) | RESPIRATORY_TRACT | Status: DC
  Administered 2017-03-20 (×2): 3 mL via RESPIRATORY_TRACT
  Filled 2017-03-20 (×2): qty 3

## 2017-03-20 MED ORDER — OSELTAMIVIR PHOSPHATE 30 MG PO CAPS: 30.0000 mg | ORAL_CAPSULE | Freq: Two times a day (BID) | ORAL | 0 refills | Status: DC

## 2017-03-20 NOTE — Discharge Summary (Signed)
Physician Discharge Summary  [name] QQI:[id number] DOB: [birth year] DOA: 03/19/2017  PCP: [name], MD  Admit date: 03/19/2017 Discharge date: 03/20/2017  Admitted From: Home Disposition: Home   Recommendations for Outpatient Follow-up:  1. Follow up with PCP in 1-2 weeks 2. Please obtain BMP and magnesium level to monitor renal function and electrolytes. Creatinine 2.02 > 1.52.   Home Health: None Equipment/Devices: None Discharge Condition: Stable CODE STATUS: Full Diet recommendation: Heart healthy  Brief/Interim Summary: Rick Melendez is a 81 y.o. male  with past medical history of coronary artery disease, diabetes, hypertension, hyperlipidemia, sleep apnea and peripheral vascular disease who presented with shortness of breath and cough. Patient started off with cold symptoms roughly a week ago. Cold symptoms overtime developed into cough that was productive with feelings of congestion in the chest. Patient also had episodes of sweating and chills. No fever. Wife is sick contact. Patient tried to see his primary care doctor 3/30 but the office was closed. Patient was seen in urgent care for evaluation and was advised to go to the emergency room due to his abnormal breath sounds on auscultation. Of note patient did get a dose of nitroglycerin for presumed pulmonary edema and urgent care. On arrival, CXR showed increased interstitial markings. Due to concern for pneumonia, empiric antibiotics were provided and the patient was brought in for observation. Flu swab was positive so tamiflu was started. The patient reports significant improvements in dyspnea, eating, ambulating normally and is discharged on tamiflu. Empiric antibiotics for pneumonia are held at discharge, and the patient will be contacted if cultures return indicating treatment.  Discharge Diagnoses:  Principal Problem:   CAP (community acquired pneumonia) Active Problems:   HTN (hypertension)   Diabetes  mellitus ([place])   Chronic renal insufficiency, stage III (moderate)- SCr 1.6 (Aug 2013)   Influenza B   AKI (acute kidney injury) ([place])  Flu: No leukocytosis, cleared lactate, improving symptomatically. No hypoxemia. Exam much more consistent with flu than CAP, so will DC abx and monitor.  - Continue tamiflu - OTC anti-tussives/honey recommended.  - Return precautions advised, pt to follow up in next week and wife to call her PCP to discuss post-exposure prophylaxis.   AKI on unknown baseline renal function: Cr 2 > 1.52 with IVF, indicating prerenal azotemia due to dehydration from the flu. Cannot comment on absence or presence of CKD with data gathered during admission.  - Renal U/S as below show cortical thinning consistent with medical renal disease, and stable renal cysts, but no acute findings or hydronephrosis.  Hyperlipidemia Continue statin, zetia  GERD Cont PPI  CAD Cont plavix and asa  DM SSI Hold glipizide while admitted.   Discharge Instructions Discharge Instructions    Diet - low sodium heart healthy    Complete by:  As directed    Discharge instructions    Complete by:  As directed    You were admitted with symptoms related to the flu and have improved with treatment. You are stable for discharge with the following recommendations:  - Continue taking tamiflu twice daily for 8 more doses, starting tonight.  - Stay hydrated by taking adequate fluids.  - You can take imodium over the counter for diarrhea, as this is most likely related to the flu and not a 2nd infection.  - If your symptoms worsen, including worsening trouble breathing, chest pain, or worsening diarrhea or are unable to tolerate your medications or fluids, seek medical attention right away. Otherwise call your  PCP on Monday to schedule a hospital follow up appointment.   Increase activity slowly    Complete by:  As directed      Allergies as of 03/20/2017   No Known Allergies     Medication  List    TAKE these medications   aspirin EC 81 MG tablet Take 81 mg by mouth daily.   clopidogrel 75 MG tablet Commonly known as:  PLAVIX Take 75 mg by mouth daily.   CoQ10 200 MG Caps Take 200 mg by mouth daily.   ezetimibe 10 MG tablet Commonly known as:  ZETIA Take 10 mg by mouth daily.   glipiZIDE 5 MG tablet Commonly known as:  GLUCOTROL Take 5 mg by mouth daily.   HYDROcodone-acetaminophen 5-325 MG tablet Commonly known as:  NORCO/VICODIN Take 1 tablet by mouth 2 (two) times daily.   lisinopril 20 MG tablet Commonly known as:  PRINIVIL,ZESTRIL Take 20 mg by mouth daily.   metoprolol tartrate 25 MG tablet Commonly known as:  LOPRESSOR Take 25 mg by mouth daily.   Omega-3 Fish Oil 1200 MG Caps Take 2,400 mg by mouth 2 (two) times daily. Take 4 capsules daily   omeprazole 20 MG capsule Commonly known as:  PRILOSEC Take 1 capsule by mouth daily.   oseltamivir 30 MG capsule Commonly known as:  TAMIFLU Take 1 capsule (30 mg total) by mouth 2 (two) times daily.   OVER THE COUNTER MEDICATION USES CPAP NIGHTLY   rosuvastatin 10 MG tablet Commonly known as:  CRESTOR Take 10 mg by mouth at bedtime.   triamterene-hydrochlorothiazide 37.5-25 MG tablet Commonly known as:  MAXZIDE-25 Take 1 tablet by mouth daily.      Follow-up Information    [name], MD. Call in 1 week(s).   Specialty:  Endocrinology Contact information: [address] [place] [address] [phone]          No Known Allergies  Consultations: None  Procedures/Studies: 03/19/2017 CHEST  2 VIEW  FINDINGS: Sternotomy wires overlie stable cardiac silhouette there is chronic bronchitic markings the lungs. There is increase in interstitial thickening in the lingula. Degenerative osteophytosis of the spine. IMPRESSION: Increased interstitial markings in the lingula concerning for pneumonia versus bronchitis.  03/19/2017 RENAL / URINARY TRACT ULTRASOUND  COMPLETE  FINDINGS: Right Kidney: Length: 2.8 cm. Normal overall parenchymal echogenicity. Mild diffuse cortical thinning. Cyst with a single thin septation arises from the upper pole measuring 13 x 16 x 15 mm. No other renal masses, no stones and no hydronephrosis. Left Kidney: Length: 11.5 cm. Normal parenchymal echogenicity. Mild diffuse cortical thinning. Simple appearing cyst arises from the upper pole measuring 15 x 18 x 19 mm. No other renal masses, no stones and no hydronephrosis. Bladder: Appears normal for degree of bladder distention.  IMPRESSION: 1. No acute findings.  No hydronephrosis. 2. Bilateral renal cysts, similar to the prior ultrasound. Bilateral renal cortical thinning.   Subjective: Pt breathing back to baseline, no oxygen requirement, no fevers. Feels generally "much better" since admission. Had diarrhea this morning that was loose, not watery.   Discharge Exam: BP 131/60 (BP Location: Right Arm)   Pulse 77   Temp 98.2 F (36.8 C) (Oral)   Resp 17   SpO2 93%   General: Elderly, nontoxic male in no distress Cardiovascular: RRR, no murmur, no JVD, no edema Respiratory: Nonlabored on room air, scattered expiratory rhonchi without prolongation.  Abdominal: Soft, NT, ND, bowel sounds +  Labs: Basic Metabolic Panel:  Recent Labs  Lab 03/19/17 1247 03/19/17 1640 03/20/17 0443  NA 134*  --  137  K 4.8  --  4.3  CL 100*  --  104  CO2 23  --  23  GLUCOSE 177*  --  125*  BUN 33*  --  30*  CREATININE 2.02*  --  1.52*  CALCIUM 8.7*  --  8.1*  MG  --  1.5*  --   PHOS  --  4.7*  --    CBC:  Recent Labs Lab 03/19/17 1247 03/20/17 0443  WBC 10.9* 8.4  NEUTROABS 7.5  --   HGB 14.3 12.4*  HCT 45.6 40.3  MCV 82.3 82.2  PLT 177 146*   Time coordinating discharge: Approximately 40 minutes  Vance Gather, MD  [facility] 03/20/2017, 3:32 PM Pager [phone]  If 7PM-7AM, please contact night-coverage [email or web address] Password [email or web address]

## 2017-03-20 NOTE — Progress Notes (Signed)
Pt. placed on CPAP for h/s per home setting of 10 cmh20, with 3 lpm oxygen added, small nasal mask used for pts. nasal pillows used @ home, humidity filled, tolerating well.

## 2017-03-20 NOTE — Progress Notes (Signed)
Pt ready for DC to home accompanied by wife.  DC instructions reviewed and copy given.  Pt/wife instucted when to take next meds.  Follow up with Dr. Lum Keas reviewed.  Ready for DC home.

## 2017-03-20 NOTE — Progress Notes (Signed)
Pt discharged to home accomp by wife.  Encouraged wife to purchase a CBG meter to monitor CBGs since infectious process may make CBG higher.  Wife stated they will purchase one and will also purchase a BP cuff for monitoring of BP once he is home.  No further complaints.

## 2017-03-21 LAB — UREA NITROGEN, URINE: Urea Nitrogen, Ur: 441 mg/dL

## 2017-03-24 LAB — CULTURE, BLOOD (ROUTINE X 2)
CULTURE: NO GROWTH
Culture: NO GROWTH

## 2017-05-27 ENCOUNTER — Other Ambulatory Visit: Payer: Self-pay | Admitting: Cardiovascular Disease

## 2017-05-27 DIAGNOSIS — I739 Peripheral vascular disease, unspecified: Secondary | ICD-10-CM

## 2017-06-05 DIAGNOSIS — G4733 Obstructive sleep apnea (adult) (pediatric): Secondary | ICD-10-CM | POA: Diagnosis not present

## 2017-06-07 DIAGNOSIS — S30860A Insect bite (nonvenomous) of lower back and pelvis, initial encounter: Secondary | ICD-10-CM | POA: Diagnosis not present

## 2017-06-08 ENCOUNTER — Encounter: Payer: Self-pay | Admitting: Cardiovascular Disease

## 2017-06-08 ENCOUNTER — Ambulatory Visit (INDEPENDENT_AMBULATORY_CARE_PROVIDER_SITE_OTHER): Payer: Medicare Other | Admitting: Cardiovascular Disease

## 2017-06-08 DIAGNOSIS — E785 Hyperlipidemia, unspecified: Secondary | ICD-10-CM

## 2017-06-08 DIAGNOSIS — I2510 Atherosclerotic heart disease of native coronary artery without angina pectoris: Secondary | ICD-10-CM | POA: Diagnosis not present

## 2017-06-08 DIAGNOSIS — I10 Essential (primary) hypertension: Secondary | ICD-10-CM

## 2017-06-08 DIAGNOSIS — I739 Peripheral vascular disease, unspecified: Secondary | ICD-10-CM | POA: Diagnosis not present

## 2017-06-08 NOTE — Assessment & Plan Note (Signed)
History of peripheral arterial disease status post left common iliac artery stenting September 2003 followed by duplex ultrasound which was last performed on 06/16/16 revealing normal ABIs and patent stents.

## 2017-06-08 NOTE — Patient Instructions (Signed)

## 2017-06-08 NOTE — Assessment & Plan Note (Signed)
History of CAD status post bypass grafting X 3  08/31/06 Dr. Servando Snare with a LIMA to the LAD and vein to obtuse marginal branch as well as the right coronary artery. He denies chest pain or shortness of breath.

## 2017-06-08 NOTE — Assessment & Plan Note (Signed)
History of dyslipidemia on statin therapy followed by his PCP 

## 2017-06-08 NOTE — Assessment & Plan Note (Signed)
History of essential hypertension blood pressure is 102/59. He is on metoprolol and lisinopril. Continue current meds at current dosing

## 2017-06-08 NOTE — Progress Notes (Signed)
06/08/2017 Rick Melendez   [birth year]  [id number]  Primary Physician Anda Kraft, MD Primary Cardiologist: Lorretta Harp MD Rick Melendez  HPI:  The patient is a 81 year old, severely obese, married Caucasian male, father of 2, grandfather to 2 grandchildren who is followed by Dr Gwenlyn Found and Dr Wilson Singer. I last saw him   06/16/16. 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 He doesn't do any regular exercise but he active around the farm with chores. He has a remote history tobacco abuse, having quit 20 years ago, as well as, treated hypertension and hyperlipidemia, and diabetes.  He has CAD and had left main 2-vessel disease by cath August 31, 2006, and underwent coronary artery bypass grafting x2 by Dr. Ceasar Mons with a LIMA to his LAD, a vein to an obtuse marginal branch and right coronary arter. He has also had stenting of his left leg back in 2003, doppler in July 2013 showed Rt iliac disease but he has been asymptomatic. He denies chest pain or shortness of breath. He does have daytime fatigue and this is unchanged. He has had some leg pain which he attributes to statin Rx. His last Myoview was performed 07/06/13 was nonischemic.[name] He sees Dr. Claiborne Billings for followup and treatment of his sleep apnea and he reports he is compliant with this. Since I saw him one year ago he remains fairly stable and denies chest pain or shortness of breath. He was admitted for community acquired pneumonia back in March but has since recovered from this.   Current Outpatient Prescriptions  Medication Sig Dispense Refill  . aspirin EC 81 MG tablet Take 81 mg by mouth daily.    . clopidogrel (PLAVIX) 75 MG tablet Take 75 mg by mouth daily.    . Coenzyme Q10 (COQ10) 200 MG CAPS Take 200 mg by mouth daily. 90 capsule 3  . ezetimibe (ZETIA) 10 MG tablet Take 10 mg by mouth daily.    . furosemide (LASIX) 20 MG tablet Take 1 tablet by mouth  daily.  5  . glipiZIDE (GLUCOTROL) 5 MG tablet Take 5 mg by mouth daily.    [name] HYDROcodone-acetaminophen (NORCO/VICODIN) 5-325 MG per tablet Take 1 tablet by mouth 2 (two) times daily.  0  . lisinopril (PRINIVIL,ZESTRIL) 20 MG tablet Take 20 mg by mouth daily.    . metoprolol tartrate (LOPRESSOR) 25 MG tablet Take 25 mg by mouth daily.    . Omega-3 Fatty Acids (OMEGA-3 FISH OIL) 1200 MG CAPS Take 2,400 mg by mouth 2 (two) times daily. Take 4 capsules daily     . omeprazole (PRILOSEC) 20 MG capsule Take 1 capsule by mouth daily.  0  . OVER THE COUNTER MEDICATION USES CPAP NIGHTLY    . rosuvastatin (CRESTOR) 10 MG tablet Take 10 mg by mouth at bedtime.  0   No current facility-administered medications for this visit.     No Known Allergies  Social History   Social History  . Marital status: Married    Spouse name: N/A  . Number of children: N/A  . Years of education: N/A   Occupational History  . Not on file.   Social History Main Topics  . Smoking status: Former Smoker    Types: Cigarettes    Quit date: 09/03/1981  . Smokeless tobacco: Current User    Types: Chew  . Alcohol use Yes  . Drug use:  No  . Sexual activity: Not on file   Other Topics Concern  . Not on file   Social History Narrative  . No narrative on file     Review of Systems: General: negative for chills, fever, night sweats or weight changes.  Cardiovascular: negative for chest pain, dyspnea on exertion, edema, orthopnea, palpitations, paroxysmal nocturnal dyspnea or shortness of breath Dermatological: negative for rash Respiratory: negative for cough or wheezing Urologic: negative for hematuria Abdominal: negative for nausea, vomiting, diarrhea, bright red blood per rectum, melena, or hematemesis Neurologic: negative for visual changes, syncope, or dizziness All other systems reviewed and are otherwise negative except as noted above.    Blood pressure (!) 102/59, pulse 76, height 5\' 8"  (1.727 m),  weight 220 lb (99.8 kg), SpO2 96 %.  General appearance: alert and no distress Neck: no adenopathy, no carotid bruit, no JVD, supple, symmetrical, trachea midline and thyroid not enlarged, symmetric, no tenderness/mass/nodules Lungs: clear to auscultation bilaterally Heart: regular rate and rhythm, S1, S2 normal, no murmur, click, rub or gallop Extremities: Trace bilateral lower extremity edema  EKG not performed today  ASSESSMENT AND PLAN:   CAD - CABG X 2 9/07 LIMA-LAD, SVG-OM. Low risk Myoview 7/12 History of CAD status post bypass grafting X 3  08/31/06 Dr. Servando Snare with a LIMA to the LAD and vein to obtuse marginal branch as well as the right coronary artery. He denies chest pain or shortness of breath.  HTN (hypertension) History of essential hypertension blood pressure is 102/59. He is on metoprolol and lisinopril. Continue current meds at current dosing  Dyslipidemia History of dyslipidemia on statin therapy followed by his PCP  PVD (peripheral vascular disease)- Rt iliac disease by doppler 7/13 History of peripheral arterial disease status post left common iliac artery stenting September 2003 followed by duplex ultrasound which was last performed on 06/16/16 revealing normal ABIs and patent stents.      Lorretta Harp MD FACP,FACC,FAHA, [facility] 06/08/2017 10:08 AM

## 2017-06-15 DIAGNOSIS — I10 Essential (primary) hypertension: Secondary | ICD-10-CM | POA: Diagnosis not present

## 2017-06-15 DIAGNOSIS — E118 Type 2 diabetes mellitus with unspecified complications: Secondary | ICD-10-CM | POA: Diagnosis not present

## 2017-06-15 DIAGNOSIS — E789 Disorder of lipoprotein metabolism, unspecified: Secondary | ICD-10-CM | POA: Diagnosis not present

## 2017-06-17 ENCOUNTER — Ambulatory Visit (HOSPITAL_COMMUNITY)
Admission: RE | Admit: 2017-06-17 | Discharge: 2017-06-17 | Disposition: A | Discharge status: Home / Self Care | Payer: Medicare Other | Source: Ambulatory Visit | Attending: Cardiovascular Disease | Admitting: Cardiovascular Disease

## 2017-06-17 ENCOUNTER — Ambulatory Visit (HOSPITAL_COMMUNITY)
Admission: RE | Admit: 2017-06-17 | Discharge: 2017-06-17 | Disposition: A | Discharge status: Home / Self Care | Payer: Medicare Other | Source: Ambulatory Visit | Attending: Cardiology | Admitting: Cardiology

## 2017-06-17 DIAGNOSIS — E1151 Type 2 diabetes mellitus with diabetic peripheral angiopathy without gangrene: Secondary | ICD-10-CM | POA: Diagnosis not present

## 2017-06-17 DIAGNOSIS — I700 Atherosclerosis of aorta: Secondary | ICD-10-CM | POA: Diagnosis not present

## 2017-06-17 DIAGNOSIS — I2510 Atherosclerotic heart disease of native coronary artery without angina pectoris: Secondary | ICD-10-CM | POA: Diagnosis not present

## 2017-06-17 DIAGNOSIS — I739 Peripheral vascular disease, unspecified: Secondary | ICD-10-CM

## 2017-06-17 DIAGNOSIS — I10 Essential (primary) hypertension: Secondary | ICD-10-CM | POA: Diagnosis not present

## 2017-06-17 DIAGNOSIS — I708 Atherosclerosis of other arteries: Secondary | ICD-10-CM | POA: Diagnosis not present

## 2017-06-17 DIAGNOSIS — Z87891 Personal history of nicotine dependence: Secondary | ICD-10-CM | POA: Diagnosis not present

## 2017-06-17 DIAGNOSIS — E785 Hyperlipidemia, unspecified: Secondary | ICD-10-CM | POA: Insufficient documentation

## 2017-06-24 ENCOUNTER — Other Ambulatory Visit: Payer: Self-pay | Admitting: Cardiovascular Disease

## 2017-06-24 DIAGNOSIS — I739 Peripheral vascular disease, unspecified: Secondary | ICD-10-CM

## 2017-07-05 DIAGNOSIS — G4733 Obstructive sleep apnea (adult) (pediatric): Secondary | ICD-10-CM | POA: Diagnosis not present

## 2017-07-26 ENCOUNTER — Ambulatory Visit (INDEPENDENT_AMBULATORY_CARE_PROVIDER_SITE_OTHER): Payer: Medicare Other | Admitting: Cardiovascular Disease

## 2017-07-26 ENCOUNTER — Encounter: Payer: Self-pay | Admitting: Cardiovascular Disease

## 2017-07-26 VITALS — BP 117/62 | HR 70 | Ht 67.0 in | Wt 215.8 lb

## 2017-07-26 DIAGNOSIS — E785 Hyperlipidemia, unspecified: Secondary | ICD-10-CM

## 2017-07-26 DIAGNOSIS — E669 Obesity, unspecified: Secondary | ICD-10-CM | POA: Diagnosis not present

## 2017-07-26 DIAGNOSIS — I2510 Atherosclerotic heart disease of native coronary artery without angina pectoris: Secondary | ICD-10-CM

## 2017-07-26 DIAGNOSIS — G4733 Obstructive sleep apnea (adult) (pediatric): Secondary | ICD-10-CM

## 2017-07-26 DIAGNOSIS — I10 Essential (primary) hypertension: Secondary | ICD-10-CM | POA: Diagnosis not present

## 2017-07-26 NOTE — Patient Instructions (Signed)
Your physician wants you to follow-up in: 1 YEAR with Dr. Claiborne Billings (sleep clinic). You will receive a reminder letter in the mail two months in advance. If you don't receive a letter, please call our office to schedule the follow-up appointment.

## 2017-07-27 DIAGNOSIS — G4733 Obstructive sleep apnea (adult) (pediatric): Secondary | ICD-10-CM | POA: Diagnosis not present

## 2017-07-28 NOTE — Progress Notes (Signed)
Patient ID: Rick Melendez, male   DOB: 06/13/1936, 81 y.o.   MRN: [id number]     HPI: Rick Melendez is a 81 y.o. male who presents for a six-month follow-up sleep clinic evaluation.  He is followed by Dr. Gwenlyn Found for cardiology care.  Rick Melendez has a history of hypertension, hyperlipidemia, CAD, and is status post CABG revascularization surgery in September 2007.  He also has a history of peripheral vascular disease.  In May 2013 he was diagnosed as having severe obstructive sleep apnea at the [facility] and his AHI was 71.3 with significant oxygen desaturation to 81%.  There was evidence for severe snoring and also periodic limb movements.  The patient has been on CPAP therapy since that time and has been using [facility] for his DME.  Apparently, with the recent Medicare contract renewals, he had to switch to [facility] for his DME company and has required 2 had a face-to-face evaluation prior to obtaining new supplies.  When I saw one year ago he was stable and having excellent compliance.  Over the past year, he continues to feel well.  He typically goes to bed 11 - 11:30 PM and wakes up at 6 AM.  He has lost 25 pounds since initiating CPAP therapy.  He had been using a Swift  FX nasal pillow mask and had a Respironics RemStar Pro CPAP unit.  He presently denies any residual daytime sleepiness.  He denies painful restless legs.  He denies hypnogagnic hallucinations.  She is Epworth Sleepiness Scale score was 4.  This year this calculated at 9 as shown below.   Epworth Sleepiness Scale: Situation   Chance of Dozing/Sleeping (0 = never , 1 = slight chance , 2 = moderate chance , 3 = high chance )   sitting and reading 1   watching TV 2   sitting inactive in a public place 1   being a passenger in a motor vehicle for an hour or more 1   lying down in the afternoon 3   sitting and talking to someone 0   sitting quietly after lunch (no alcohol) 1   while stopped for a  few minutes in traffic as the driver 0   Total Score  9   Since I last saw him, he now has a ResMed AirSense 10 CPAP auto unit.  He is set at 10 cm water pressure.  A download was obtained from 06/23/2017 through 07/22/2017.  He is 100% compliant with usage stays and is using it 93% of the time greater than 4 hours.  Unfortunately, he is only averaging 5 hours and 20 minutes per night.  AHI remains excellent at 0.7.  He states that he does breathe through his mouth.  When I look at his leak.  His leak is significantly elevated every night and despite this, his AHI is excellent.  He denies any residual daytime sleepiness.  A new Epworth scale was calculated in the office today and this endorsed at 7.  He presents for evaluation  Past Medical History:  Diagnosis Date  . Arthritis   . Coronary artery disease 2007   cabg x3  . Diabetes mellitus without complication ([place])   . Hyperlipidemia   . Hypertension   . Influenza B 03/19/2017  . OSA on CPAP    Dr [name] - follows and treatmentof sleep apnea  . PVD (peripheral vascular disease) ([place]) 2003   DR BERRY-  -LEFT LEG STENTING  Past Surgical History:  Procedure Laterality Date  . CARDIAC CATHETERIZATION  0911/2007   3 vessel CAD WITH LEFT MAIN CAD SEVERE ,norm lLIMA,LV nom. ,evidence for very mild aortic stenosis with gradient, mild stenosis distal abdnminal aotra and proximal left common iliac artery 30%  . CORONARY ARTERY BYPASS GRAFT  08/31/2006   DR GERHARDT---LIMA to LAD, VEIN TO AN OBTUSE MARGINAL BRANCH AND  RIGHT CORONARY ARTERY  . CPET  04/13/2012   normal  . DOPPLER ECHOCARDIOGRAPHY  07/08/2011   EF =>55%  . lower arterial doppler  07/01/2012   mildly abnormal lower extremity;ABIs >1 bilaterally with moderately high-grade right iliac stenosis that had not changed from proir study.  [name] NM MYOCAR PERF WALL MOTION  7/182012   EF 59%, normal myocardial perfusio    No Known Allergies  Current Outpatient Prescriptions  Medication  Sig Dispense Refill  . aspirin EC 81 MG tablet Take 81 mg by mouth daily.    . clopidogrel (PLAVIX) 75 MG tablet Take 75 mg by mouth daily.    . Coenzyme Q10 (COQ10) 200 MG CAPS Take 200 mg by mouth daily. 90 capsule 3  . ezetimibe (ZETIA) 10 MG tablet Take 10 mg by mouth daily.    . furosemide (LASIX) 20 MG tablet Take 1 tablet by mouth daily.  5  . glipiZIDE (GLUCOTROL) 5 MG tablet Take 5 mg by mouth daily.    [name] HYDROcodone-acetaminophen (NORCO/VICODIN) 5-325 MG per tablet Take 1 tablet by mouth 2 (two) times daily.  0  . lisinopril (PRINIVIL,ZESTRIL) 20 MG tablet Take 20 mg by mouth daily.    . metoprolol tartrate (LOPRESSOR) 25 MG tablet Take 25 mg by mouth daily.    . Omega-3 Fatty Acids (OMEGA-3 FISH OIL) 1200 MG CAPS Take 2,400 mg by mouth 2 (two) times daily. Take 4 capsules daily     . omeprazole (PRILOSEC) 20 MG capsule Take 1 capsule by mouth daily.  0  . OVER THE COUNTER MEDICATION USES CPAP NIGHTLY    . rosuvastatin (CRESTOR) 10 MG tablet Take 10 mg by mouth at bedtime.  0   No current facility-administered medications for this visit.     Social History   Social History  . Marital status: Married    Spouse name: N/A  . Number of children: N/A  . Years of education: N/A   Occupational History  . Not on file.   Social History Main Topics  . Smoking status: Former Smoker    Types: Cigarettes    Quit date: 09/03/1981  . Smokeless tobacco: Current User    Types: Chew  . Alcohol use Yes  . Drug use: No  . Sexual activity: Not on file   Other Topics Concern  . Not on file   Social History Narrative  . No narrative on file    Family History  Problem Relation Age of Onset  . Cancer Father   . Cancer Brother      ROS General: Negative; No fevers, chills, or night sweats HEENT: Negative; No changes in vision or hearing, sinus congestion, difficulty swallowing Pulmonary: Negative; No cough, wheezing, shortness of breath, hemoptysis Cardiovascular: Negative; No  chest pain, presyncope, syncope, palpatations GI: Negative; No nausea, vomiting, diarrhea, or abdominal pain GU: Negative; No dysuria, hematuria, or difficulty voiding Musculoskeletal: Negative; no myalgias, joint pain, or weakness Hematologic: Negative; no easy bruising, bleeding Endocrine: Negative; no heat/cold intolerance Neuro: Negative; no changes in balance, headaches Skin: Negative; No rashes or skin lesions Psychiatric: Negative; No  behavioral problems, depression Sleep: Positive for obstructive sleep apnea on CPAP therapy; No residual daytime sleepiness, hypersomnolence, bruxism, restless legs, hypnogognic hallucinations, no cataplexy   Physical Exam BP 117/62   Pulse 70   Ht 5\' 7"  (1.702 m)   Wt 215 lb 12.8 oz (97.9 kg)   SpO2 93%   BMI 33.80 kg/m    Repeat blood pressure by me 124/70  Wt Readings from Last 3 Encounters:  07/26/17 215 lb 12.8 oz (97.9 kg)  06/08/17 220 lb (99.8 kg)  03/02/17 228 lb (103.4 kg)   General: Alert, oriented, no distress.  Skin: normal turgor, no rashes, warm and dry HEENT: Normocephalic, atraumatic. Pupils equal round and reactive to light; sclera anicteric; extraocular muscles intact;  arcus senilis Nose without nasal septal hypertrophy Mouth/Parynx benign; full dentures Mallinpatti scale 3/4 Neck: No JVD, no carotid bruits; normal carotid upstroke Lungs: clear to ausculatation and percussion; no wheezing or rales Chest wall: without tenderness to palpitation Heart: PMI not displaced, RRR, s1 s2 normal, 1/6 systolic murmur, no diastolic murmur, no rubs, gallops, thrills, or heaves Abdomen: soft, nontender; no hepatosplenomehaly, BS+; abdominal aorta nontender and not dilated by palpation. Back: no CVA tenderness Pulses 2+ Musculoskeletal: full range of motion, normal strength, no joint deformities Extremities: no clubbing cyanosis or edema, Homan's sign negative  Neurologic: grossly nonfocal; Cranial nerves grossly wnl Psychologic:  Normal mood and affect   LABS:  BMP Latest Ref Rng & Units 03/20/2017 03/19/2017  Glucose 65 - 99 mg/dL 125(H) 177(H)  BUN 6 - 20 mg/dL 30(H) 33(H)  Creatinine 0.61 - 1.24 mg/dL 1.52(H) 2.02(H)  Sodium 135 - 145 mmol/L 137 134(L)  Potassium 3.5 - 5.1 mmol/L 4.3 4.8  Chloride 101 - 111 mmol/L 104 100(L)  CO2 22 - 32 mmol/L 23 23  Calcium 8.9 - 10.3 mg/dL 8.1(L) 8.7(L)     No flowsheet data found.   CBC Latest Ref Rng & Units 03/20/2017 03/19/2017  WBC 4.0 - 10.5 K/uL 8.4 10.9(H)  Hemoglobin 13.0 - 17.0 g/dL 12.4(L) 14.3  Hematocrit 39.0 - 52.0 % 40.3 45.6  Platelets 150 - 400 K/uL 146(L) 177     Lipid Panel  No results found for: CHOL, TRIG, HDL, CHOLHDL, VLDL, LDLCALC, LDLDIRECT   RADIOLOGY: No results found.  IMPRESSION:  1. OSA (obstructive sleep apnea)   2. Essential hypertension   3. CAD in native artery   4. Dyslipidemia   5. Mild obesity     ASSESSMENT AND PLAN: Rick Melendez is a 81 year old white male who has significant cardiovascular comorbidities including hypertension, hyperlipidemia, CAD, PVD, and is status post CABG revascularization surgery.  He has had previously documented severe obstructive sleep apnea and had been using CPAP therapy since 2013.  His blood pressure today is stable on his medical regimen consisting of lisinopril 20 mg, metoprolol 25 mg, and Maxide.  He continues to take Zetia 10 mg and Crestor 20 mg for hyperlipidemia.  He is on dual antiplatelet therapy.  He is diabetic on glipizide.  I reviewed his download with his ResMed AirSense 10 CPAP unit with him in detail.  His download is excellent with the exception of a significant leak.  He is using nasal pillows.  I have written a prescription for him to use a chinstrap, which hopefully will reduce oral venting.  If he continues to have a significant  leak an alternative mask may be necessary.  I discussed with him at length the importance of improved sleep duration.  He is  only averaging 5  hours and 20 minutes per night.  I discussed with him.  Typical sleep pattern for an adult should be at least 7-8 hours.  I discussed difficulty with cognitive function with continual sleep deprivation.  He also discussed the importance of using the CPAP all night.  We discussed sleep hygiene.  Body mass is elevated at 33.8 and is consistent with mild obesity.  Weight loss and increased exercise was recommended.  He will return to his primary care.  Dr. Wilson Singer has just retired and he will be reestablishing with Dr. Maudie Mercury. Time spent: 25 minutes  Troy Sine, MD, [facility]  07/28/2017 7:27 PM

## 2017-07-29 ENCOUNTER — Other Ambulatory Visit (INDEPENDENT_AMBULATORY_CARE_PROVIDER_SITE_OTHER): Payer: Self-pay

## 2017-07-29 ENCOUNTER — Telehealth (INDEPENDENT_AMBULATORY_CARE_PROVIDER_SITE_OTHER): Payer: Self-pay

## 2017-07-29 ENCOUNTER — Encounter (INDEPENDENT_AMBULATORY_CARE_PROVIDER_SITE_OTHER): Payer: Self-pay | Admitting: Orthopedic Surgery

## 2017-07-29 ENCOUNTER — Ambulatory Visit (INDEPENDENT_AMBULATORY_CARE_PROVIDER_SITE_OTHER): Payer: Medicare Other | Admitting: Orthopedic Surgery

## 2017-07-29 DIAGNOSIS — D485 Neoplasm of uncertain behavior of skin: Secondary | ICD-10-CM | POA: Diagnosis not present

## 2017-07-29 DIAGNOSIS — M1712 Unilateral primary osteoarthritis, left knee: Secondary | ICD-10-CM | POA: Diagnosis not present

## 2017-07-29 DIAGNOSIS — L720 Epidermal cyst: Secondary | ICD-10-CM | POA: Diagnosis not present

## 2017-07-29 DIAGNOSIS — L821 Other seborrheic keratosis: Secondary | ICD-10-CM | POA: Diagnosis not present

## 2017-07-29 DIAGNOSIS — Z85828 Personal history of other malignant neoplasm of skin: Secondary | ICD-10-CM | POA: Diagnosis not present

## 2017-07-29 DIAGNOSIS — M1711 Unilateral primary osteoarthritis, right knee: Secondary | ICD-10-CM | POA: Diagnosis not present

## 2017-07-29 DIAGNOSIS — L570 Actinic keratosis: Secondary | ICD-10-CM | POA: Diagnosis not present

## 2017-07-29 DIAGNOSIS — M170 Bilateral primary osteoarthritis of knee: Secondary | ICD-10-CM

## 2017-07-29 MED ORDER — METHYLPREDNISOLONE ACETATE 40 MG/ML IJ SUSP
40.0000 mg | INTRAMUSCULAR | Status: AC | PRN
  Administered 2017-07-29: 40 mg via INTRA_ARTICULAR

## 2017-07-29 MED ORDER — LIDOCAINE HCL 1 % IJ SOLN
5.0000 mL | INTRAMUSCULAR | Status: AC | PRN
  Administered 2017-07-29: 5 mL

## 2017-07-29 NOTE — Progress Notes (Signed)
Office Visit Note   Patient: Rick Melendez           Date of Birth: [birth year]           MRN: [id number] Visit Date: 07/29/2017              Requested by: Anda Kraft, MD [address] [place], [place] [address] PCP: Anda Kraft, MD  Chief Complaint  Patient presents with  . Right Knee - Pain  . Left Knee - Pain    HPI: Patient is a 81 y.o male who presents today for bilateral knee osteoarthritis. Has been having Depo-Medrol injections with moderate relief of pain. States relief typically last about 4 weeks. Requests repeat injection today. He is interested in discussing other options such as gel injections. Is hoping to delay knee replacement.    Assessment & Plan: Visit Diagnoses:  1. Bilateral primary osteoarthritis of knee     Plan:We'll proceed with bilateral Depo-Medrol injections for the knees. We'll set up approval of Synvisc for bilateral knees. He'll follow-up in office when Depo-Medrol relief has worn off. We discussed the possibility of total knee arthroplasty.  Follow-Up Instructions: Return if symptoms worsen or fail to improve.   Right Knee Exam   Tenderness  The patient is experiencing tenderness in the lateral joint line and medial joint line.  Range of Motion  The patient has normal right knee ROM.  Tests  Varus: negative Valgus: negative  Other  Erythema: absent Swelling: none   Left Knee Exam   Tenderness  The patient is experiencing tenderness in the lateral joint line and medial joint line.  Range of Motion  The patient has normal left knee ROM.  Tests  Varus: negative Valgus: negative  Other  Erythema: absent Swelling: none      ROS: Complete review of systems negative except as mentioned in the history of present illness.  Imaging: No results found.  Labs: Lab Results  Component Value Date   REPTSTATUS 03/24/2017 FINAL 03/19/2017   CULT NO GROWTH 5 DAYS 03/19/2017    Orders:  No orders of the  defined types were placed in this encounter.  No orders of the defined types were placed in this encounter.    Procedures: Large Joint Inj Date/Time: 07/29/2017 3:18 PM Performed by: Suzan Slick Authorized by: Dondra Prader R   Consent Given by:  Patient Site marked: the procedure site was marked   Timeout: prior to procedure the correct patient, procedure, and site was verified   Indications:  Pain and diagnostic evaluation Location:  Knee Site:  R knee Needle Size:  22 G Needle Length:  1.5 inches Ultrasound Guidance: No   Fluoroscopic Guidance: No   Arthrogram: No   Medications:  5 mL lidocaine 1 %; 40 mg methylPREDNISolone acetate 40 MG/ML Aspiration Attempted: No   Patient tolerance:  Patient tolerated the procedure well with no immediate complications Large Joint Inj Date/Time: 07/29/2017 3:18 PM Performed by: Suzan Slick Authorized by: Dondra Prader R   Consent Given by:  Patient Site marked: the procedure site was marked   Timeout: prior to procedure the correct patient, procedure, and site was verified   Indications:  Pain and diagnostic evaluation Location:  Knee Site:  L knee Needle Size:  22 G Needle Length:  1.5 inches Ultrasound Guidance: No   Fluoroscopic Guidance: No   Arthrogram: No   Medications:  5 mL lidocaine 1 %; 40 mg methylPREDNISolone acetate 40 MG/ML Aspiration Attempted:  No   Patient tolerance:  Patient tolerated the procedure well with no immediate complications    Clinical Data: No additional findings.  Subjective: Review of Systems  Constitutional: Negative for chills and fever.  Musculoskeletal: Positive for arthralgias.    Objective: Vital Signs: There were no vitals taken for this visit.  Specialty Comments:  No specialty comments available.  PMFS History: Patient Active Problem List   Diagnosis Date Noted  . Influenza B   . AKI (acute kidney injury) ([facility])   . CAP (community acquired pneumonia) 03/19/2017  .  Bilateral primary osteoarthritis of knee 01/28/2017  . Impingement syndrome of right shoulder 01/28/2017  . CAD - CABG X 2 9/07 LIMA-LAD, SVG-OM. Low risk Myoview 7/12 09/07/2013  . HTN (hypertension) 09/07/2013  . Dyslipidemia 09/07/2013  . Diabetes mellitus ([place]) 09/07/2013  . Chronic renal insufficiency, stage III (moderate)- SCr 1.6 (Aug 2013) 09/07/2013  . Sleep apnea- compliant with C-pap 09/07/2013  . PVD (peripheral vascular disease)- Rt iliac disease by doppler 7/13 09/07/2013   Past Medical History:  Diagnosis Date  . Arthritis   . Coronary artery disease 2007   cabg x3  . Diabetes mellitus without complication ([place])   . Hyperlipidemia   . Hypertension   . Influenza B 03/19/2017  . OSA on CPAP    Dr Claiborne Billings - follows and treatmentof sleep apnea  . PVD (peripheral vascular disease) ([place]) 2003   DR BERRY-  -LEFT LEG STENTING    Family History  Problem Relation Age of Onset  . Cancer Father   . Cancer Brother     Past Surgical History:  Procedure Laterality Date  . CARDIAC CATHETERIZATION  0911/2007   3 vessel CAD WITH LEFT MAIN CAD SEVERE ,norm lLIMA,LV nom. ,evidence for very mild aortic stenosis with gradient, mild stenosis distal abdnminal aotra and proximal left common iliac artery 30%  . CORONARY ARTERY BYPASS GRAFT  08/31/2006   DR GERHARDT---LIMA to LAD, VEIN TO AN OBTUSE MARGINAL BRANCH AND  RIGHT CORONARY ARTERY  . CPET  04/13/2012   normal  . DOPPLER ECHOCARDIOGRAPHY  07/08/2011   EF =>55%  . lower arterial doppler  07/01/2012   mildly abnormal lower extremity;ABIs >1 bilaterally with moderately high-grade right iliac stenosis that had not changed from proir study.  [name] NM MYOCAR PERF WALL MOTION  7/182012   EF 59%, normal myocardial perfusio   Social History   Occupational History  . Not on file.   Social History Main Topics  . Smoking status: Former Smoker    Types: Cigarettes    Quit date: 09/03/1981  . Smokeless tobacco: Current User    Types: Chew    . Alcohol use Yes  . Drug use: No  . Sexual activity: Not on file

## 2017-07-29 NOTE — Telephone Encounter (Signed)
-----   Message from Suzan Slick, NP sent at 07/29/2017  3:19 PM EDT ----- synvisc for both knees please

## 2017-08-02 ENCOUNTER — Telehealth: Payer: Self-pay | Admitting: *Deleted

## 2017-08-02 DIAGNOSIS — G4733 Obstructive sleep apnea (adult) (pediatric): Secondary | ICD-10-CM | POA: Diagnosis not present

## 2017-08-02 NOTE — Telephone Encounter (Signed)
Received request for last FTF encounter to be faxed to [facility].  Last OV note faxed to # provided.

## 2017-08-03 NOTE — Telephone Encounter (Signed)
auth submitted to synvisc online portal. rx was faxed for injections. Will hold this message pending approval from insurance company.

## 2017-08-05 ENCOUNTER — Telehealth (INDEPENDENT_AMBULATORY_CARE_PROVIDER_SITE_OTHER): Payer: Self-pay | Admitting: Radiology

## 2017-08-05 DIAGNOSIS — G4733 Obstructive sleep apnea (adult) (pediatric): Secondary | ICD-10-CM | POA: Diagnosis not present

## 2017-08-05 NOTE — Telephone Encounter (Signed)
Patients wife is calling states that he needs another injection in his back.  Please advise.

## 2017-08-05 NOTE — Telephone Encounter (Signed)
Spoke with wife and scheduled pt for 08/17/17 @ 2:00

## 2017-08-05 NOTE — Telephone Encounter (Signed)
Repeat last, bilateral two level facets

## 2017-08-05 NOTE — Telephone Encounter (Signed)
lmovm for Rick Melendez to call us back to get [facility] scheduled for an injection

## 2017-08-06 NOTE — Telephone Encounter (Signed)
Called and lm on vm for pt to advise that [facility] has been trying to call the pt. He needs to give verbal ok to ship gel injections to the office.

## 2017-08-09 NOTE — Telephone Encounter (Signed)
Correction. pts rx is being processed through [facility] and will contact us if there is any further information needed. Pending approval

## 2017-08-12 NOTE — Telephone Encounter (Signed)
Pt's wife called and asked for the phone number and order number again. Advised to call our office once they have made arrangements with pharm and we can make appt for injection.

## 2017-08-12 NOTE — Telephone Encounter (Signed)
I called [facility] and they advised that the rx had been on hold since receiving on 08/04/17. Called [phone] order # [id number] the pt has an out of pocket expense of $505.49 that must be paid to [facility] before they will dispense medication. They tried to call pt while we were on the phone and lm on vm to call them back. I also called the pt to advise that he will have to make the return call to pharm and make payment to them and give verbal consent to ship medication and then we can make appt for him to get them in bilat knees. Lm on vm for pt to call with questions otherwise I left the information ( contact number for [facility] and order number) so that he can make arrangements.

## 2017-08-17 ENCOUNTER — Ambulatory Visit (INDEPENDENT_AMBULATORY_CARE_PROVIDER_SITE_OTHER): Payer: Medicare Other

## 2017-08-17 ENCOUNTER — Ambulatory Visit (INDEPENDENT_AMBULATORY_CARE_PROVIDER_SITE_OTHER): Payer: Medicare Other | Admitting: Physical Medicine and Rehabilitation

## 2017-08-17 ENCOUNTER — Encounter (INDEPENDENT_AMBULATORY_CARE_PROVIDER_SITE_OTHER): Payer: Self-pay | Admitting: Physical Medicine and Rehabilitation

## 2017-08-17 VITALS — BP 123/59 | HR 62 | Temp 97.9°F

## 2017-08-17 DIAGNOSIS — M545 Low back pain: Secondary | ICD-10-CM

## 2017-08-17 DIAGNOSIS — M47816 Spondylosis without myelopathy or radiculopathy, lumbar region: Secondary | ICD-10-CM | POA: Diagnosis not present

## 2017-08-17 DIAGNOSIS — G8929 Other chronic pain: Secondary | ICD-10-CM

## 2017-08-17 MED ORDER — METHYLPREDNISOLONE ACETATE 80 MG/ML IJ SUSP
80.0000 mg | Freq: Once | INTRAMUSCULAR | Status: AC
  Administered 2017-08-17: 80 mg

## 2017-08-17 MED ORDER — LIDOCAINE HCL (PF) 1 % IJ SOLN
2.0000 mL | Freq: Once | INTRAMUSCULAR | Status: AC
  Administered 2017-08-17: 2 mL

## 2017-08-17 NOTE — Patient Instructions (Signed)

## 2017-08-17 NOTE — Progress Notes (Deleted)
Increased low back pain for 1 month.  Right=left. Worse with walking and standing.Denies leg pain.

## 2017-08-19 ENCOUNTER — Telehealth (INDEPENDENT_AMBULATORY_CARE_PROVIDER_SITE_OTHER): Payer: Self-pay | Admitting: Orthopedic Surgery

## 2017-08-19 NOTE — Telephone Encounter (Signed)
Patient's wife Rick Melendez) called needing to know if the medication for the bil  knee injection have arrived yet. The number to contact Rick Melendez is [phone]

## 2017-08-19 NOTE — Telephone Encounter (Signed)
Called pt to notify him as soon we receive the Gel injection.

## 2017-08-20 ENCOUNTER — Telehealth (INDEPENDENT_AMBULATORY_CARE_PROVIDER_SITE_OTHER): Payer: Self-pay | Admitting: Radiology

## 2017-08-20 NOTE — Procedures (Signed)
Rick Melendez is a 81 year old gentleman still active and plays golf a few times a year. He comes in with worsening low back pain for about one month. His right equal to left is worse with walking and standing without leg pain or claudication. Imaging in the past as shown facet arthropathy without stenosis. Prior facet joint blocks again anywhere from 4-6 months of relief. Prior injection was in March. We had a discussion today because he usually is lost to follow up to a degree and I'm not only sure it's helping as long as he tells me it is. We tried to go over again medial branch blocks and radiofrequency ablation. We'll go ahead today and repeat the facet joint injections intra-articularly.The injection  will be diagnostic and hopefully therapeutic. The patient has failed conservative care including time, medications and activity modification.  Lumbar Facet Joint Intra-Articular Injection(s) with Fluoroscopic Guidance  Patient: Rick Melendez      Date of Birth: [birth year] MRN: [id number] PCP: Jani Gravel, MD      Visit Date: 08/17/2017   Universal Protocol:    Date/Time: 08/17/2017  Consent Given By: the patient  Position: PRONE   Additional Comments: Vital signs were monitored before and after the procedure. Patient was prepped and draped in the usual sterile fashion. The correct patient, procedure, and site was verified.   Injection Procedure Details:  Procedure Site One Meds Administered:  Meds ordered this encounter  Medications  . lidocaine (PF) (XYLOCAINE) 1 % injection 2 mL  . methylPREDNISolone acetate (DEPO-MEDROL) injection 80 mg     Laterality: Bilateral  Location/Site:  L4-L5 L5-S1  Needle size: 22 guage  Needle type: Spinal  Needle Placement: Articular  Findings:  -Contrast Used: 1 mL iohexol 180 mg iodine/mL   -Comments: Excellent flow of contrast producing a partial arthrogram.  Procedure Details: The fluoroscope beam is vertically oriented in AP, and the  inferior recess is visualized beneath the lower pole of the inferior apophyseal process, which represents the target point for needle insertion. When direct visualization is difficult the target point is located at the medial projection of the vertebral pedicle. The region overlying each aforementioned target is locally anesthetized with a 1 to 2 ml. volume of 1% Lidocaine without Epinephrine.   The spinal needle was inserted into each of the above mentioned facet joints using biplanar fluoroscopic guidance. A 0.25 to 0.5 ml. volume of Isovue-250 was injected and a partial facet joint arthrogram was obtained. A single spot film was obtained of the resulting arthrogram.    One to 1.25 ml of the steroid/anesthetic solution was then injected into each of the facet joints noted above.   Additional Comments:  The patient tolerated the procedure well Dressing: Band-Aid    Post-procedure details: Patient was observed during the procedure. Post-procedure instructions were reviewed.  Patient left the clinic in stable condition.

## 2017-08-20 NOTE — Telephone Encounter (Signed)
I called and spoke with Rick Melendez from [facility]. She advised that we should be getting injections delivered to our office today.

## 2017-08-25 ENCOUNTER — Telehealth (INDEPENDENT_AMBULATORY_CARE_PROVIDER_SITE_OTHER): Payer: Self-pay | Admitting: Radiology

## 2017-08-25 NOTE — Telephone Encounter (Signed)
Patients bilateral synvisc injection did arrive, he is coming in this Thursday for his shots. They are provided through the specialty pharmacy, will have Dr. Sharol Given select that patient supplied injections so insurance is not billed.

## 2017-08-26 ENCOUNTER — Ambulatory Visit (INDEPENDENT_AMBULATORY_CARE_PROVIDER_SITE_OTHER): Payer: Medicare Other | Admitting: Family

## 2017-08-26 ENCOUNTER — Encounter (INDEPENDENT_AMBULATORY_CARE_PROVIDER_SITE_OTHER): Payer: Self-pay | Admitting: Family

## 2017-08-26 DIAGNOSIS — M170 Bilateral primary osteoarthritis of knee: Secondary | ICD-10-CM

## 2017-08-26 DIAGNOSIS — M1712 Unilateral primary osteoarthritis, left knee: Secondary | ICD-10-CM | POA: Diagnosis not present

## 2017-08-26 DIAGNOSIS — M1711 Unilateral primary osteoarthritis, right knee: Secondary | ICD-10-CM | POA: Diagnosis not present

## 2017-08-26 MED ORDER — LIDOCAINE HCL 1 % IJ SOLN
1.0000 mL | INTRAMUSCULAR | Status: AC | PRN
  Administered 2017-08-26: 1 mL

## 2017-08-26 MED ORDER — HYLAN G-F 20 48 MG/6ML IX SOSY
48.0000 mg | PREFILLED_SYRINGE | INTRA_ARTICULAR | Status: AC | PRN
  Administered 2017-08-26: 48 mg via INTRA_ARTICULAR

## 2017-08-26 MED ORDER — HYLAN G-F 20 48 MG/6ML IX SOSY
48.0000 mg | PREFILLED_SYRINGE | INTRA_ARTICULAR | Status: AC | PRN
Start: 2017-08-26 — End: 2017-08-26
  Administered 2017-08-26: 48 mg via INTRA_ARTICULAR

## 2017-08-26 NOTE — Progress Notes (Signed)
Office Visit Note   Patient: Rick Melendez           Date of Birth: 1936/12/10           MRN: [id number] Visit Date: 08/26/2017              Requested by: Jani Gravel, [address], [place] [address] PCP: Jani Gravel, MD  Chief Complaint  Patient presents with  . Right Knee - Injections    Supplied by specialty pharmacy  . Left Knee - Injections    Supplied for synvisc injection      HPI: The patient is a 81 year old gentleman with a history of bilateral osteoarthritis knees. Here today for Synvisc one injections bilaterally.  Assessment & Plan: Visit Diagnoses: No diagnosis found.  Plan: Him bilateral Synvisc one injections knees follow-up in office as needed  Follow-Up Instructions: No Follow-up on file.   Right Knee Exam   Tenderness  The patient is experiencing tenderness in the medial joint line.  Range of Motion  The patient has normal right knee ROM.  Muscle Strength   The patient has normal right knee strength.  Other  Other tests: no effusion present   Left Knee Exam   Tenderness  The patient is experiencing tenderness in the medial joint line.  Range of Motion  The patient has normal left knee ROM.  Muscle Strength   The patient has normal left knee strength.  Other  Effusion: no effusion present      Patient is alert, oriented, no adenopathy, well-dressed, normal affect, normal respiratory effort.   Imaging: No results found. No images are attached to the encounter.  Labs: Lab Results  Component Value Date   REPTSTATUS 03/24/2017 FINAL 03/19/2017   CULT NO GROWTH 5 DAYS 03/19/2017    Orders:  No orders of the defined types were placed in this encounter.  No orders of the defined types were placed in this encounter.    Procedures: Large Joint Inj Date/Time: 08/26/2017 9:05 AM Performed by: Suzan Slick Authorized by: Dondra Prader R   Consent Given by:  Patient Site marked: the procedure  site was marked   Timeout: prior to procedure the correct patient, procedure, and site was verified   Indications:  Pain and diagnostic evaluation Location:  Knee Site:  R knee Needle Size:  22 G Needle Length:  1.5 inches Ultrasound Guidance: No   Fluoroscopic Guidance: No   Arthrogram: No   Medications:  48 mg Hylan 48 MG/6ML; 1 mL lidocaine 1 % Aspiration Attempted: No   Patient tolerance:  Patient tolerated the procedure well with no immediate complications Large Joint Inj Date/Time: 08/26/2017 9:05 AM Performed by: Suzan Slick Authorized by: Dondra Prader R   Consent Given by:  Patient Site marked: the procedure site was marked   Timeout: prior to procedure the correct patient, procedure, and site was verified   Indications:  Pain and diagnostic evaluation Location:  Knee Site:  L knee Needle Size:  22 G Needle Length:  1.5 inches Ultrasound Guidance: No   Fluoroscopic Guidance: No   Arthrogram: No   Medications:  1 mL lidocaine 1 %; 48 mg Hylan 48 MG/6ML Aspiration Attempted: No   Patient tolerance:  Patient tolerated the procedure well with no immediate complications    Clinical Data: No additional findings.  ROS:  All other systems negative, except as noted in the HPI. Review of Systems  Constitutional: Negative for chills and fever.  Musculoskeletal: Positive for arthralgias.    Objective: Vital Signs: There were no vitals taken for this visit.  Specialty Comments:  No specialty comments available.  PMFS History: Patient Active Problem List   Diagnosis Date Noted  . Influenza B   . AKI (acute kidney injury) ([place])   . CAP (community acquired pneumonia) 03/19/2017  . Bilateral primary osteoarthritis of knee 01/28/2017  . Impingement syndrome of right shoulder 01/28/2017  . CAD - CABG X 2 9/07 LIMA-LAD, SVG-OM. Low risk Myoview 7/12 09/07/2013  . HTN (hypertension) 09/07/2013  . Dyslipidemia 09/07/2013  . Diabetes mellitus ([place]) 09/07/2013  .  Chronic renal insufficiency, stage III (moderate)- SCr 1.6 (Aug 2013) 09/07/2013  . Sleep apnea- compliant with C-pap 09/07/2013  . PVD (peripheral vascular disease)- Rt iliac disease by doppler 7/13 09/07/2013   Past Medical History:  Diagnosis Date  . Arthritis   . Coronary artery disease 2007   cabg x3  . Diabetes mellitus without complication ([place])   . Hyperlipidemia   . Hypertension   . Influenza B 03/19/2017  . OSA on CPAP    Dr Claiborne Billings - follows and treatmentof sleep apnea  . PVD (peripheral vascular disease) ([place]) 2003   DR BERRY-  -LEFT LEG STENTING    Family History  Problem Relation Age of Onset  . Cancer Father   . Cancer Brother     Past Surgical History:  Procedure Laterality Date  . CARDIAC CATHETERIZATION  0911/2007   3 vessel CAD WITH LEFT MAIN CAD SEVERE ,norm lLIMA,LV nom. ,evidence for very mild aortic stenosis with gradient, mild stenosis distal abdnminal aotra and proximal left common iliac artery 30%  . CORONARY ARTERY BYPASS GRAFT  08/31/2006   DR GERHARDT---LIMA to LAD, VEIN TO AN OBTUSE MARGINAL BRANCH AND  RIGHT CORONARY ARTERY  . CPET  04/13/2012   normal  . DOPPLER ECHOCARDIOGRAPHY  07/08/2011   EF =>55%  . lower arterial doppler  07/01/2012   mildly abnormal lower extremity;ABIs >1 bilaterally with moderately high-grade right iliac stenosis that had not changed from proir study.  [name] NM MYOCAR PERF WALL MOTION  7/182012   EF 59%, normal myocardial perfusio   Social History   Occupational History  . Not on file.   Social History Main Topics  . Smoking status: Former Smoker    Types: Cigarettes    Quit date: 09/03/1981  . Smokeless tobacco: Current User    Types: Chew  . Alcohol use Yes  . Drug use: No  . Sexual activity: Not on file

## 2017-09-05 DIAGNOSIS — G4733 Obstructive sleep apnea (adult) (pediatric): Secondary | ICD-10-CM | POA: Diagnosis not present

## 2017-09-14 DIAGNOSIS — N183 Chronic kidney disease, stage 3 (moderate): Secondary | ICD-10-CM | POA: Diagnosis not present

## 2017-09-23 DIAGNOSIS — Z23 Encounter for immunization: Secondary | ICD-10-CM | POA: Diagnosis not present

## 2017-09-23 DIAGNOSIS — I10 Essential (primary) hypertension: Secondary | ICD-10-CM | POA: Diagnosis not present

## 2017-09-23 DIAGNOSIS — N183 Chronic kidney disease, stage 3 (moderate): Secondary | ICD-10-CM | POA: Diagnosis not present

## 2017-09-23 DIAGNOSIS — R809 Proteinuria, unspecified: Secondary | ICD-10-CM | POA: Diagnosis not present

## 2017-11-05 DIAGNOSIS — G4733 Obstructive sleep apnea (adult) (pediatric): Secondary | ICD-10-CM | POA: Diagnosis not present

## 2017-12-05 DIAGNOSIS — G4733 Obstructive sleep apnea (adult) (pediatric): Secondary | ICD-10-CM | POA: Diagnosis not present

## 2018-01-05 DIAGNOSIS — G4733 Obstructive sleep apnea (adult) (pediatric): Secondary | ICD-10-CM | POA: Diagnosis not present

## 2018-02-03 DIAGNOSIS — L821 Other seborrheic keratosis: Secondary | ICD-10-CM | POA: Diagnosis not present

## 2018-02-03 DIAGNOSIS — L738 Other specified follicular disorders: Secondary | ICD-10-CM | POA: Diagnosis not present

## 2018-02-03 DIAGNOSIS — Z85828 Personal history of other malignant neoplasm of skin: Secondary | ICD-10-CM | POA: Diagnosis not present

## 2018-02-03 DIAGNOSIS — L570 Actinic keratosis: Secondary | ICD-10-CM | POA: Diagnosis not present

## 2018-02-03 DIAGNOSIS — C4441 Basal cell carcinoma of skin of scalp and neck: Secondary | ICD-10-CM | POA: Diagnosis not present

## 2018-02-03 DIAGNOSIS — C44319 Basal cell carcinoma of skin of other parts of face: Secondary | ICD-10-CM | POA: Diagnosis not present

## 2018-02-03 DIAGNOSIS — L720 Epidermal cyst: Secondary | ICD-10-CM | POA: Diagnosis not present

## 2018-02-05 DIAGNOSIS — G4733 Obstructive sleep apnea (adult) (pediatric): Secondary | ICD-10-CM | POA: Diagnosis not present

## 2018-02-07 ENCOUNTER — Telehealth (INDEPENDENT_AMBULATORY_CARE_PROVIDER_SITE_OTHER): Payer: Self-pay | Admitting: Physical Medicine and Rehabilitation

## 2018-02-07 NOTE — Telephone Encounter (Signed)
Ok but will revisit idea of medial branch block and RFA

## 2018-02-08 NOTE — Telephone Encounter (Signed)
Scheduled for 03/01/18 at 1300 with driver.

## 2018-02-21 DIAGNOSIS — I10 Essential (primary) hypertension: Secondary | ICD-10-CM | POA: Diagnosis not present

## 2018-02-21 DIAGNOSIS — E118 Type 2 diabetes mellitus with unspecified complications: Secondary | ICD-10-CM | POA: Diagnosis not present

## 2018-02-21 DIAGNOSIS — Z5181 Encounter for therapeutic drug level monitoring: Secondary | ICD-10-CM | POA: Diagnosis not present

## 2018-02-21 DIAGNOSIS — M545 Low back pain: Secondary | ICD-10-CM | POA: Diagnosis not present

## 2018-02-21 DIAGNOSIS — Z125 Encounter for screening for malignant neoplasm of prostate: Secondary | ICD-10-CM | POA: Diagnosis not present

## 2018-02-24 DIAGNOSIS — I10 Essential (primary) hypertension: Secondary | ICD-10-CM | POA: Diagnosis not present

## 2018-02-24 DIAGNOSIS — E118 Type 2 diabetes mellitus with unspecified complications: Secondary | ICD-10-CM | POA: Diagnosis not present

## 2018-02-24 DIAGNOSIS — Z0000 Encounter for general adult medical examination without abnormal findings: Secondary | ICD-10-CM | POA: Diagnosis not present

## 2018-02-24 DIAGNOSIS — Z23 Encounter for immunization: Secondary | ICD-10-CM | POA: Diagnosis not present

## 2018-02-24 DIAGNOSIS — E789 Disorder of lipoprotein metabolism, unspecified: Secondary | ICD-10-CM | POA: Diagnosis not present

## 2018-03-01 ENCOUNTER — Ambulatory Visit (INDEPENDENT_AMBULATORY_CARE_PROVIDER_SITE_OTHER): Payer: Medicare Other | Admitting: Physical Medicine and Rehabilitation

## 2018-03-01 ENCOUNTER — Ambulatory Visit (INDEPENDENT_AMBULATORY_CARE_PROVIDER_SITE_OTHER): Payer: Medicare Other

## 2018-03-01 ENCOUNTER — Encounter (INDEPENDENT_AMBULATORY_CARE_PROVIDER_SITE_OTHER): Payer: Self-pay | Admitting: Physical Medicine and Rehabilitation

## 2018-03-01 VITALS — BP 116/62 | HR 55 | Temp 98.0°F

## 2018-03-01 DIAGNOSIS — M545 Low back pain, unspecified: Secondary | ICD-10-CM

## 2018-03-01 DIAGNOSIS — G8929 Other chronic pain: Secondary | ICD-10-CM

## 2018-03-01 DIAGNOSIS — M47816 Spondylosis without myelopathy or radiculopathy, lumbar region: Secondary | ICD-10-CM

## 2018-03-01 MED ORDER — METHYLPREDNISOLONE ACETATE 80 MG/ML IJ SUSP
80.0000 mg | Freq: Once | INTRAMUSCULAR | Status: AC
  Administered 2018-03-01: 80 mg

## 2018-03-01 NOTE — Patient Instructions (Signed)

## 2018-03-01 NOTE — Progress Notes (Signed)
Rick Melendez - 82 y.o. male MRN [id number]  Date of birth: [birth year]  Office Visit Note: Visit Date: 03/01/2018 PCP: Jani Gravel, MD Referred by: Jani Gravel, MD  Subjective: Chief Complaint  Patient presents with  . Lower Back - Pain   HPI: [name] is a pleasant 82 year old gentleman with a history of chronic axial low back pain which has responded quite nicely to intra-articular facet joint blocks.  Knowing these are really fallen out of favor but he has done extremely well the last time I saw him was in August.  He reports last injection gave him excellent relief up until about a month ago.  He denies any leg pain.  Is worse with standing and going from sit to stand.  He has no hip or groin pain.  He has had no trauma.  We are to repeat the bilateral L4-5 and L5-S1 facet joints.   ROS Otherwise per HPI.  Assessment & Plan: Visit Diagnoses:  1. Spondylosis without myelopathy or radiculopathy, lumbar region   2. Chronic bilateral low back pain without sciatica     Plan: No additional findings.   Meds & Orders:  Meds ordered this encounter  Medications  . methylPREDNISolone acetate (DEPO-MEDROL) injection 80 mg    Orders Placed This Encounter  Procedures  . Facet Injection  . XR C-ARM NO REPORT    Follow-up: Return if symptoms worsen or fail to improve.   Procedures: No procedures performed  Lumbar Facet Joint Intra-Articular Injection(s) with Fluoroscopic Guidance  Patient: Rick Melendez      Date of Birth: [birth year] MRN: [id number] PCP: Jani Gravel, MD      Visit Date: 03/01/2018   Universal Protocol:    Date/Time: 03/01/2018  Consent Given By: the patient  Position: PRONE   Additional Comments: Vital signs were monitored before and after the procedure. Patient was prepped and draped in the usual sterile fashion. The correct patient, procedure, and site was verified.   Injection Procedure Details:  Procedure Site One Meds Administered:  Meds ordered  this encounter  Medications  . methylPREDNISolone acetate (DEPO-MEDROL) injection 80 mg     Laterality: Bilateral  Location/Site:  L4-L5 L5-S1  Needle size: 22 guage  Needle type: Spinal  Needle Placement: Articular  Findings:  -Comments: Excellent flow of contrast producing a partial arthrogram.  Procedure Details: The fluoroscope beam is vertically oriented in AP, and the inferior recess is visualized beneath the lower pole of the inferior apophyseal process, which represents the target point for needle insertion. When direct visualization is difficult the target point is located at the medial projection of the vertebral pedicle. The region overlying each aforementioned target is locally anesthetized with a 1 to 2 ml. volume of 1% Lidocaine without Epinephrine.   The spinal needle was inserted into each of the above mentioned facet joints using biplanar fluoroscopic guidance. A 0.25 to 0.5 ml. volume of Isovue-250 was injected and a partial facet joint arthrogram was obtained. A single spot film was obtained of the resulting arthrogram.    One to 1.25 ml of the steroid/anesthetic solution was then injected into each of the facet joints noted above.   Additional Comments:  The patient tolerated the procedure well Dressing: Band-Aid    Post-procedure details: Patient was observed during the procedure. Post-procedure instructions were reviewed.  Patient left the clinic in stable condition.    Clinical History: L4-5: Advanced facet hypertrophy is present. There is uncovering of a broad-based disc herniation. Moderate lateral  recess narrowing is worse on the left. Mild to moderate left and moderate right foraminal stenosis is evident.  L5-S1: A leftward disc protrusion is present. Mild facet hypertrophy is noted. This results in mild left lateral recess narrowing. The foramina are patent.  IMPRESSION: 1. The most significant right-sided disease is at L3-4 and L4-5. 2.  Moderate right and mild left foraminal stenosis at L3-4 with mild lateral recess narrowing bilaterally. 3. Advanced facet hypertrophy and spurring, and broad-based disc protrusion, and anterolisthesis leads to moderate lateral recess narrowing at L4-5, worse on the left. 4. Mild to moderate left and moderate right foraminal stenosis at L4-5. 5. Mild left lateral recess narrowing at L5-S1. 6. Mild left lateral recess and moderate left foraminal stenosis at T12-L1. 7. Mild lateral recess narrowing at L1-2 is worse on the left.   Electronically Signed   By: Rick Melendez M.D.   On: 05/27/2014 17:56   He reports that he quit smoking about 36 years ago. His smoking use included cigarettes. His smokeless tobacco use includes chew. No results for input(s): HGBA1C, LABURIC in the last 8760 hours.  Objective:  VS:  HT:    WT:   BMI:     BP:116/62  HR:(!) 55bpm  TEMP:98 F (36.7 C)(Oral)  RESP:93 % Physical Exam  Ortho Exam Imaging: No results found.  Past Medical/Family/Surgical/Social History: Medications & Allergies reviewed per EMR, new medications updated. Patient Active Problem List   Diagnosis Date Noted  . Influenza B   . AKI (acute kidney injury) ([place])   . CAP (community acquired pneumonia) 03/19/2017  . Bilateral primary osteoarthritis of knee 01/28/2017  . Impingement syndrome of right shoulder 01/28/2017  . CAD - CABG X 2 9/07 LIMA-LAD, SVG-OM. Low risk Myoview 7/12 09/07/2013  . HTN (hypertension) 09/07/2013  . Dyslipidemia 09/07/2013  . Diabetes mellitus ([place]) 09/07/2013  . Chronic renal insufficiency, stage III (moderate)- SCr 1.6 (Aug 2013) 09/07/2013  . Sleep apnea- compliant with C-pap 09/07/2013  . PVD (peripheral vascular disease)- Rt iliac disease by doppler 7/13 09/07/2013   Past Medical History:  Diagnosis Date  . Arthritis   . Coronary artery disease 2007   cabg x3  . Diabetes mellitus without complication ([place])   . Hyperlipidemia   .  Hypertension   . Influenza B 03/19/2017  . OSA on CPAP    Dr Claiborne Billings - follows and treatmentof sleep apnea  . PVD (peripheral vascular disease) ([place]) 2003   DR BERRY-  -LEFT LEG STENTING   Family History  Problem Relation Age of Onset  . Cancer Father   . Cancer Brother    Past Surgical History:  Procedure Laterality Date  . CARDIAC CATHETERIZATION  0911/2007   3 vessel CAD WITH LEFT MAIN CAD SEVERE ,norm lLIMA,LV nom. ,evidence for very mild aortic stenosis with gradient, mild stenosis distal abdnminal aotra and proximal left common iliac artery 30%  . CORONARY ARTERY BYPASS GRAFT  08/31/2006   DR GERHARDT---LIMA to LAD, VEIN TO AN OBTUSE MARGINAL BRANCH AND  RIGHT CORONARY ARTERY  . CPET  04/13/2012   normal  . DOPPLER ECHOCARDIOGRAPHY  07/08/2011   EF =>55%  . lower arterial doppler  07/01/2012   mildly abnormal lower extremity;ABIs >1 bilaterally with moderately high-grade right iliac stenosis that had not changed from proir study.  [name] NM MYOCAR PERF WALL MOTION  7/182012   EF 59%, normal myocardial perfusio   Social History   Occupational History  . Not on file  Tobacco Use  .  Smoking status: Former Smoker    Types: Cigarettes    Last attempt to quit: 09/03/1981    Years since quitting: 36.5  . Smokeless tobacco: Current User    Types: Chew  Substance and Sexual Activity  . Alcohol use: Yes  . Drug use: No  . Sexual activity: Not on file  Pt states lower back pain. Pt states pain started back up a month ago. Pt states last injection helped out a lot until a month ago. Pt states picking up heavy objects and laying down makes pain worse, medication takes the edge off.   [name]

## 2018-03-17 NOTE — Procedures (Signed)
Lumbar Facet Joint Intra-Articular Injection(s) with Fluoroscopic Guidance  Patient: Rick Melendez      Date of Birth: 1936-05-16 MRN: [id number] PCP: Jani Gravel, MD      Visit Date: 03/01/2018   Universal Protocol:    Date/Time: 03/01/2018  Consent Given By: the patient  Position: PRONE   Additional Comments: Vital signs were monitored before and after the procedure. Patient was prepped and draped in the usual sterile fashion. The correct patient, procedure, and site was verified.   Injection Procedure Details:  Procedure Site One Meds Administered:  Meds ordered this encounter  Medications  . methylPREDNISolone acetate (DEPO-MEDROL) injection 80 mg     Laterality: Bilateral  Location/Site:  L4-L5 L5-S1  Needle size: 22 guage  Needle type: Spinal  Needle Placement: Articular  Findings:  -Comments: Excellent flow of contrast producing a partial arthrogram.  Procedure Details: The fluoroscope beam is vertically oriented in AP, and the inferior recess is visualized beneath the lower pole of the inferior apophyseal process, which represents the target point for needle insertion. When direct visualization is difficult the target point is located at the medial projection of the vertebral pedicle. The region overlying each aforementioned target is locally anesthetized with a 1 to 2 ml. volume of 1% Lidocaine without Epinephrine.   The spinal needle was inserted into each of the above mentioned facet joints using biplanar fluoroscopic guidance. A 0.25 to 0.5 ml. volume of Isovue-250 was injected and a partial facet joint arthrogram was obtained. A single spot film was obtained of the resulting arthrogram.    One to 1.25 ml of the steroid/anesthetic solution was then injected into each of the facet joints noted above.   Additional Comments:  The patient tolerated the procedure well Dressing: Band-Aid    Post-procedure details: Patient was observed during the  procedure. Post-procedure instructions were reviewed.  Patient left the clinic in stable condition.

## 2018-03-17 NOTE — Progress Notes (Signed)
Numeric Pain Rating Scale and Functional Assessment Average Pain 8   In the last MONTH (on 0-10 scale) has pain interfered with the following?  1. General activity like being  able to carry out your everyday physical activities such as walking, climbing stairs, carrying groceries, or moving a chair?  Rating(2)   +Driver, +BT (plavix, asprin), -Dye Allergies.

## 2018-03-23 DIAGNOSIS — J019 Acute sinusitis, unspecified: Secondary | ICD-10-CM | POA: Diagnosis not present

## 2018-04-14 DIAGNOSIS — N390 Urinary tract infection, site not specified: Secondary | ICD-10-CM | POA: Diagnosis not present

## 2018-04-14 DIAGNOSIS — R5383 Other fatigue: Secondary | ICD-10-CM | POA: Diagnosis not present

## 2018-04-15 DIAGNOSIS — I10 Essential (primary) hypertension: Secondary | ICD-10-CM | POA: Diagnosis not present

## 2018-04-15 DIAGNOSIS — E789 Disorder of lipoprotein metabolism, unspecified: Secondary | ICD-10-CM | POA: Diagnosis not present

## 2018-04-15 DIAGNOSIS — N390 Urinary tract infection, site not specified: Secondary | ICD-10-CM | POA: Diagnosis not present

## 2018-04-19 DIAGNOSIS — E875 Hyperkalemia: Secondary | ICD-10-CM | POA: Diagnosis not present

## 2018-04-19 DIAGNOSIS — N390 Urinary tract infection, site not specified: Secondary | ICD-10-CM | POA: Diagnosis not present

## 2018-04-27 DIAGNOSIS — E118 Type 2 diabetes mellitus with unspecified complications: Secondary | ICD-10-CM | POA: Diagnosis not present

## 2018-04-27 DIAGNOSIS — N390 Urinary tract infection, site not specified: Secondary | ICD-10-CM | POA: Diagnosis not present

## 2018-04-27 DIAGNOSIS — Z5181 Encounter for therapeutic drug level monitoring: Secondary | ICD-10-CM | POA: Diagnosis not present

## 2018-04-27 DIAGNOSIS — I10 Essential (primary) hypertension: Secondary | ICD-10-CM | POA: Diagnosis not present

## 2018-04-27 DIAGNOSIS — Z79899 Other long term (current) drug therapy: Secondary | ICD-10-CM | POA: Diagnosis not present

## 2018-04-28 DIAGNOSIS — R6883 Chills (without fever): Secondary | ICD-10-CM | POA: Diagnosis not present

## 2018-04-28 DIAGNOSIS — E119 Type 2 diabetes mellitus without complications: Secondary | ICD-10-CM | POA: Diagnosis not present

## 2018-04-28 DIAGNOSIS — N281 Cyst of kidney, acquired: Secondary | ICD-10-CM | POA: Diagnosis not present

## 2018-04-28 DIAGNOSIS — R319 Hematuria, unspecified: Secondary | ICD-10-CM | POA: Diagnosis not present

## 2018-04-28 DIAGNOSIS — N390 Urinary tract infection, site not specified: Secondary | ICD-10-CM | POA: Diagnosis not present

## 2018-04-28 DIAGNOSIS — G4733 Obstructive sleep apnea (adult) (pediatric): Secondary | ICD-10-CM | POA: Diagnosis not present

## 2018-04-28 DIAGNOSIS — I10 Essential (primary) hypertension: Secondary | ICD-10-CM | POA: Diagnosis not present

## 2018-05-13 DIAGNOSIS — Z961 Presence of intraocular lens: Secondary | ICD-10-CM | POA: Diagnosis not present

## 2018-05-13 DIAGNOSIS — E119 Type 2 diabetes mellitus without complications: Secondary | ICD-10-CM | POA: Diagnosis not present

## 2018-05-13 DIAGNOSIS — H5203 Hypermetropia, bilateral: Secondary | ICD-10-CM | POA: Diagnosis not present

## 2018-05-13 DIAGNOSIS — H47322 Drusen of optic disc, left eye: Secondary | ICD-10-CM | POA: Diagnosis not present

## 2018-05-20 DIAGNOSIS — N3001 Acute cystitis with hematuria: Secondary | ICD-10-CM | POA: Diagnosis not present

## 2018-05-20 DIAGNOSIS — R8271 Bacteriuria: Secondary | ICD-10-CM | POA: Diagnosis not present

## 2018-05-26 DIAGNOSIS — G4733 Obstructive sleep apnea (adult) (pediatric): Secondary | ICD-10-CM | POA: Diagnosis not present

## 2018-06-01 ENCOUNTER — Telehealth (INDEPENDENT_AMBULATORY_CARE_PROVIDER_SITE_OTHER): Payer: Self-pay | Admitting: Physical Medicine and Rehabilitation

## 2018-06-02 NOTE — Telephone Encounter (Signed)
Scheduled for 06/30/18 at 1600 with driver.

## 2018-06-02 NOTE — Telephone Encounter (Signed)
Go ahead and repeat and if helps again would get RFA approved

## 2018-06-02 NOTE — Telephone Encounter (Signed)
Left message for patient's wife to call back to schedule.

## 2018-06-13 ENCOUNTER — Telehealth: Payer: Self-pay | Admitting: Cardiovascular Disease

## 2018-06-13 NOTE — Telephone Encounter (Signed)
Pt's wife calling to see if appt for 6-25 needs to be moved out to after his tests 06-22-18? Pls call wife Clarise Cruz [phone]

## 2018-06-13 NOTE — Telephone Encounter (Signed)
Spoke with Pt wife who wanted to know if it would be better for pt 1 year f/u to be scheduled after dopplers. Advised that it would be better so Dr. Gwenlyn Found could go over results at [place]. Appointment cancelled for 06/14/18 and moved to 06/29/18 at 10:15pm.

## 2018-06-14 ENCOUNTER — Ambulatory Visit: Payer: Medicare Other | Admitting: Cardiovascular Disease

## 2018-06-20 DIAGNOSIS — N3001 Acute cystitis with hematuria: Secondary | ICD-10-CM | POA: Diagnosis not present

## 2018-06-22 ENCOUNTER — Ambulatory Visit (HOSPITAL_COMMUNITY)
Admission: RE | Admit: 2018-06-22 | Discharge: 2018-06-22 | Disposition: A | Discharge status: Home / Self Care | Payer: Medicare Other | Source: Ambulatory Visit | Attending: Cardiology | Admitting: Cardiology

## 2018-06-22 DIAGNOSIS — E119 Type 2 diabetes mellitus without complications: Secondary | ICD-10-CM | POA: Insufficient documentation

## 2018-06-22 DIAGNOSIS — Z87891 Personal history of nicotine dependence: Secondary | ICD-10-CM | POA: Diagnosis not present

## 2018-06-22 DIAGNOSIS — Z95828 Presence of other vascular implants and grafts: Secondary | ICD-10-CM | POA: Diagnosis not present

## 2018-06-22 DIAGNOSIS — E785 Hyperlipidemia, unspecified: Secondary | ICD-10-CM | POA: Diagnosis not present

## 2018-06-22 DIAGNOSIS — I70203 Unspecified atherosclerosis of native arteries of extremities, bilateral legs: Secondary | ICD-10-CM | POA: Insufficient documentation

## 2018-06-22 DIAGNOSIS — I739 Peripheral vascular disease, unspecified: Secondary | ICD-10-CM

## 2018-06-22 DIAGNOSIS — E669 Obesity, unspecified: Secondary | ICD-10-CM | POA: Diagnosis not present

## 2018-06-22 DIAGNOSIS — I10 Essential (primary) hypertension: Secondary | ICD-10-CM | POA: Insufficient documentation

## 2018-06-29 ENCOUNTER — Encounter: Payer: Self-pay | Admitting: Cardiovascular Disease

## 2018-06-29 ENCOUNTER — Other Ambulatory Visit: Payer: Self-pay | Admitting: *Deleted

## 2018-06-29 ENCOUNTER — Ambulatory Visit: Payer: Medicare Other | Admitting: Cardiovascular Disease

## 2018-06-29 VITALS — BP 134/50 | HR 50 | Ht 66.0 in | Wt 220.0 lb

## 2018-06-29 DIAGNOSIS — I2510 Atherosclerotic heart disease of native coronary artery without angina pectoris: Secondary | ICD-10-CM | POA: Diagnosis not present

## 2018-06-29 DIAGNOSIS — I10 Essential (primary) hypertension: Secondary | ICD-10-CM | POA: Diagnosis not present

## 2018-06-29 DIAGNOSIS — I739 Peripheral vascular disease, unspecified: Secondary | ICD-10-CM

## 2018-06-29 DIAGNOSIS — E785 Hyperlipidemia, unspecified: Secondary | ICD-10-CM | POA: Diagnosis not present

## 2018-06-29 NOTE — Patient Instructions (Signed)
Medication Instructions: Your physician recommends that you continue on your current medications as directed. Please refer to the Current Medication list given to you today.  If you need a refill on your cardiac medications before your next appointment, please call your pharmacy.   Follow-Up: Your physician wants you to follow-up in 12 months with Dr. Berry. You will receive a reminder letter in the mail two months in advance. If you don't receive a letter, please call our office at [phone] to schedule this follow-up appointment.   Thank you for choosing [facility]!!      

## 2018-06-29 NOTE — Assessment & Plan Note (Signed)
History of essential hypertension her blood pressure measured today at 134/50.  He is on lisinopril and metoprolol.  Continue current meds at current dosing.

## 2018-06-29 NOTE — Assessment & Plan Note (Signed)
History of CAD status post coronary artery bypass grafting x2 by Dr. Servando Snare after cath performed 08/31/2006 with a LIMA to his LAD and a vein to obtuse marginal branch and right coronary artery.  He is been asymptomatic since.  Last Myoview performed 07/06/2013 was nonischemic.

## 2018-06-29 NOTE — Progress Notes (Signed)
06/29/2018 Rick Melendez   10/18/1936  [id number]  Primary Physician Rick Gravel, MD Primary Cardiologist: Rick Harp MD Rick Melendez, [place]  HPI:  Rick Melendez is a 82 y.o.  severely obese, married Caucasian male, father of 2, grandfather to 2 grandchildren who is followed by Rick Melendez and Rick Melendez. I last saw him   06/08/2017.  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 He doesn't do any regular exercise but he active around the farm with chores. He has a remote history tobacco abuse, having quit 20 years ago, as well as, treated hypertension and hyperlipidemia, and diabetes.  He has CAD and had left main 2-vessel disease by cath August 31, 2006, and underwent coronary artery bypass grafting x2 by Rick Melendez with a LIMA to his LAD, a vein to an obtuse marginal branch and right coronary arter. He has also had stenting of his left leg back in 2003, doppler in July 2013 showed Rt iliac disease but he has been asymptomatic. He denies chest pain or shortness of breath. He does have daytime fatigue and this is unchanged. He has had some leg pain which he attributes to statin Rx. His last Myoview was performed 07/06/13 was nonischemic.[name] He sees Rick. [name] for followup and treatment of his sleep apnea and he reports he is compliant with this. Since I saw him one year ago he remains fairly stable and denies chest pain or shortness of breath, or claudication..  He has had a severe urinary tract infection since I last saw him requiring multiple antibiotics.  Dopplers performed 06/24/2018 revealed normal ABIs with patent iliac stents.   Current Meds  Medication Sig  . aspirin EC 81 MG tablet Take 81 mg by mouth daily.  . clopidogrel (PLAVIX) 75 MG tablet Take 75 mg by mouth daily.  . Coenzyme Q10 (COQ10) 200 MG CAPS Take 200 mg by mouth daily.  [name] ezetimibe (ZETIA) 10 MG tablet  Take 10 mg by mouth daily.  . furosemide (LASIX) 20 MG tablet Take 1 tablet by mouth daily.  [name] glipiZIDE (GLUCOTROL) 5 MG tablet Take 5 mg by mouth daily.  [name] HYDROcodone-acetaminophen (NORCO/VICODIN) 5-325 MG per tablet Take 1 tablet by mouth 2 (two) times daily.  [name] lisinopril (PRINIVIL,ZESTRIL) 20 MG tablet Take 20 mg by mouth daily.  . metoprolol tartrate (LOPRESSOR) 25 MG tablet Take 25 mg by mouth daily.  . Omega-3 Fatty Acids (OMEGA-3 FISH OIL) 1200 MG CAPS Take 2,400 mg by mouth 2 (two) times daily. Take 4 capsules daily   . omeprazole (PRILOSEC) 20 MG capsule Take 1 capsule by mouth daily.  Rick Melendez test strip U FOR GLUCOSE TESTING TID  . OVER THE COUNTER MEDICATION USES CPAP NIGHTLY  . rosuvastatin (CRESTOR) 10 MG tablet Take 10 mg by mouth at bedtime.     No Known Allergies  Social History   Socioeconomic History  . Marital status: Married    Spouse name: Not on file  . Number of children: Not on file  . Years of education: Not on file  . Highest education level: Not on file  Occupational History  . Not on file  Social Needs  . Financial resource strain: Not on file  . Food insecurity:    Worry: Not on file    Inability: Not on file  . Transportation  needs:    Medical: Not on file    Non-medical: Not on file  Tobacco Use  . Smoking status: Former Smoker    Types: Cigarettes    Last attempt to quit: 09/03/1981    Years since quitting: 36.8  . Smokeless tobacco: Current User    Types: Chew  Substance and Sexual Activity  . Alcohol use: Yes  . Drug use: No  . Sexual activity: Not on file  Lifestyle  . Physical activity:    Days per week: Not on file    Minutes per session: Not on file  . Stress: Not on file  Relationships  . Social connections:    Talks on phone: Not on file    Gets together: Not on file    Attends religious service: Not on file    Active member of club or organization: Not on file    Attends meetings of clubs or organizations: Not  on file    Relationship status: Not on file  . Intimate partner violence:    Fear of current or ex partner: Not on file    Emotionally abused: Not on file    Physically abused: Not on file    Forced sexual activity: Not on file  Other Topics Concern  . Not on file  Social History Narrative  . Not on file     Review of Systems: General: negative for chills, fever, night sweats or weight changes.  Cardiovascular: negative for chest pain, dyspnea on exertion, edema, orthopnea, palpitations, paroxysmal nocturnal dyspnea or shortness of breath Dermatological: negative for rash Respiratory: negative for cough or wheezing Urologic: negative for hematuria Abdominal: negative for nausea, vomiting, diarrhea, bright red blood per rectum, melena, or hematemesis Neurologic: negative for visual changes, syncope, or dizziness All other systems reviewed and are otherwise negative except as noted above.    Blood pressure (!) 134/50, pulse (!) 50, height 5\' 6"  (1.676 m), weight 220 lb (99.8 kg).  General appearance: alert and no distress Neck: no adenopathy, no carotid bruit, no JVD, supple, symmetrical, trachea midline and thyroid not enlarged, symmetric, no tenderness/mass/nodules Lungs: clear to auscultation bilaterally Heart: regular rate and rhythm, S1, S2 normal, no murmur, click, rub or gallop Extremities: extremities normal, atraumatic, no cyanosis or edema Pulses: 2+ and symmetric Skin: Skin color, texture, turgor normal. No rashes or lesions Neurologic: Alert and oriented X 3, normal strength and tone. Normal symmetric reflexes. Normal coordination and gait  EKG sinus bradycardia 50 without ST or T wave changes.  I personally reviewed this EKG.  ASSESSMENT AND PLAN:   CAD - CABG X 2 9/07 LIMA-LAD, SVG-OM. Low risk Myoview 7/12 History of CAD status post coronary artery bypass grafting x2 by Rick. Servando Snare after cath performed 08/31/2006 with a LIMA to his LAD and a vein to obtuse  marginal branch and right coronary artery.  He is been asymptomatic since.  Last Myoview performed 07/06/2013 was nonischemic.  HTN (hypertension) History of essential hypertension her blood pressure measured today at 134/50.  He is on lisinopril and metoprolol.  Continue current meds at current dosing.  Dyslipidemia History of dyslipidemia on Zetia and statin therapy.  PVD (peripheral vascular disease)- Rt iliac disease by doppler 7/13 History of peripheral arterial disease status post stenting of his left iliac by Rick. Glade Lloyd back in 2003 with recent Dopplers performed 06/24/2018 revealing normal ABIs with patent iliac stents.  He denies claudication but does have back pain.      Rick Harp MD  FACP,FACC,FAHA, FSCAI 06/29/2018 10:18 AM

## 2018-06-29 NOTE — Assessment & Plan Note (Signed)
History of peripheral arterial disease status post stenting of his left iliac by Dr. Glade Lloyd back in 2003 with recent Dopplers performed 06/24/2018 revealing normal ABIs with patent iliac stents.  He denies claudication but does have back pain.

## 2018-06-29 NOTE — Assessment & Plan Note (Signed)
History of dyslipidemia on Zetia and statin therapy.

## 2018-06-30 ENCOUNTER — Ambulatory Visit (INDEPENDENT_AMBULATORY_CARE_PROVIDER_SITE_OTHER): Payer: Self-pay

## 2018-06-30 ENCOUNTER — Encounter (INDEPENDENT_AMBULATORY_CARE_PROVIDER_SITE_OTHER): Payer: Self-pay | Admitting: Physical Medicine and Rehabilitation

## 2018-06-30 ENCOUNTER — Ambulatory Visit (INDEPENDENT_AMBULATORY_CARE_PROVIDER_SITE_OTHER): Payer: Medicare Other | Admitting: Physical Medicine and Rehabilitation

## 2018-06-30 VITALS — BP 143/67 | HR 52

## 2018-06-30 DIAGNOSIS — M47816 Spondylosis without myelopathy or radiculopathy, lumbar region: Secondary | ICD-10-CM | POA: Diagnosis not present

## 2018-06-30 MED ORDER — METHYLPREDNISOLONE ACETATE 80 MG/ML IJ SUSP
80.0000 mg | Freq: Once | INTRAMUSCULAR | Status: AC
  Administered 2018-06-30: 80 mg

## 2018-06-30 NOTE — Progress Notes (Signed)
 .  Numeric Pain Rating Scale and Functional Assessment Average Pain 8   In the last MONTH (on 0-10 scale) has pain interfered with the following?  1. General activity like being  able to carry out your everyday physical activities such as walking, climbing stairs, carrying groceries, or moving a chair?  Rating(4)   +Driver, -BT, -Dye Allergies.  

## 2018-06-30 NOTE — Patient Instructions (Signed)

## 2018-07-14 NOTE — Procedures (Signed)
Lumbar Diagnostic Facet Joint Nerve Block with Fluoroscopic Guidance   Patient: Rick Melendez      Date of Birth: 1936-10-01 MRN: [id number] PCP: Jani Gravel, MD      Visit Date: 06/30/2018   Universal Protocol:    Date/Time: 07/25/196:38 AM  Consent Given By: the patient  Position: PRONE  Additional Comments: Vital signs were monitored before and after the procedure. Patient was prepped and draped in the usual sterile fashion. The correct patient, procedure, and site was verified.   Injection Procedure Details:  Procedure Site One Meds Administered:  Meds ordered this encounter  Medications  . methylPREDNISolone acetate (DEPO-MEDROL) injection 80 mg     Laterality: Bilateral  Location/Site:  L4-L5 L5-S1  Needle size: 22 ga.  Needle type:spinal  Needle Placement: Oblique pedical  Findings:   -Comments: There was excellent flow of contrast along the articular pillars without intravascular flow.  Procedure Details: The fluoroscope beam is vertically oriented in AP and then obliqued 15 to 20 degrees to the ipsilateral side of the desired nerve to achieve the "Scotty dog" appearance.  The skin over the target area of the junction of the superior articulating process and the transverse process (sacral ala if blocking the L5 dorsal rami) was locally anesthetized with a 1 ml volume of 1% Lidocaine without Epinephrine.  The spinal needle was inserted and advanced in a trajectory view down to the target.   After contact with periosteum and negative aspirate for blood and CSF, correct placement without intravascular or epidural spread was confirmed by injecting 0.5 ml. of Isovue-250.  A spot radiograph was obtained of this image.    Next, a 0.5 ml. volume of the injectate described above was injected. The needle was then redirected to the other facet joint nerves mentioned above if needed.  Prior to the procedure, the patient was given a Pain Diary which was completed for  baseline measurements.  After the procedure, the patient rated their pain every 30 minutes and will continue rating at this frequency for a total of 5 hours.  The patient has been asked to complete the Diary and return to [place] by mail, fax or hand delivered as soon as possible.   Additional Comments:  The patient tolerated the procedure well Dressing: Band-Aid    Post-procedure details: Patient was observed during the procedure. Post-procedure instructions were reviewed.  Patient left the clinic in stable condition.

## 2018-07-14 NOTE — Progress Notes (Signed)
Rick Melendez - 82 y.o. male MRN [id number]  Date of birth: Sep 16, 1936  Office Visit Note: Visit Date: 06/30/2018 PCP: Jani Gravel, MD Referred by: Jani Gravel, MD  Subjective: Chief Complaint  Patient presents with  . Lower Back - Pain   HPI: Rick Melendez is an 82 year old gentleman that I have known over the last several years for chronic worsening axial low back pain which is facet mediated low back pain as evidenced by imaging of the lumbar spine including x-ray imaging and MRI.  He has mainly facet arthropathy of the lower spine.  We have completed diagnostic and therapeutic facet blocks intra-articularly in the past with good relief and he would really only come in a couple times a year.  We last saw him in March and completed diagnostic medial branch blocks as a first step towards looking at radiofrequency ablation.  He has not had recent physical therapy but is a very active individual around a small farm that they have.  He has not had any radicular pain or new trauma.  He has a significant cardiovascular history with stenting and peripheral vascular disease.  Since we have seen him he has had a significant issue with severe urinary tract infection but is recovered from that.  He has no pain down the legs or focal weakness.  He has pain with standing and relieving at rest and with medication.  He takes small amount of pain medication.  Symptoms do seem to result in some loss of function that he just cannot stand as long as he would like.   Review of Systems  Constitutional: Negative for chills, fever, malaise/fatigue and weight loss.  HENT: Negative for hearing loss and sinus pain.   Eyes: Negative for blurred vision, double vision and photophobia.  Respiratory: Negative for cough and shortness of breath.   Cardiovascular: Negative for chest pain, palpitations and leg swelling.  Gastrointestinal: Negative for abdominal pain, nausea and vomiting.  Genitourinary: Negative for flank pain.    Musculoskeletal: Positive for back pain and joint pain. Negative for myalgias.  Skin: Negative for itching and rash.  Neurological: Negative for tremors, focal weakness and weakness.  Endo/Heme/Allergies: Negative.   Psychiatric/Behavioral: Negative for depression.  All other systems reviewed and are negative.  Otherwise per HPI.  Assessment & Plan: Visit Diagnoses:  1. Spondylosis without myelopathy or radiculopathy, lumbar region     Plan: Findings:  Chronic history of axial low back pain now with recent flareup of back pain progressive over the last several weeks.  Prior diagnostic medial branch blocks are documented at more than 80% relief.  He does well every time we either block or inject the facet joints and this is clearly a finding consistent with his clinical picture.  He has pain with facet joint loading on exam.  He has no radicular pain and no level of stenosis and imaging.  He has had one set of diagnostic medial branch blocks and several intra-articular injections in the past.  We talked about activity modification and exercises.  I did give him home exercises to perform for core strengthening.  We potentially could look at short course of physical therapy.  For his orthopedic complaints of knee pain and other joint pain he will continue to follow with Dr. Sharol Given in our office.  We will complete a second set of diagnostic facet block/medial branch blocks today.    Meds & Orders:  Meds ordered this encounter  Medications  . methylPREDNISolone acetate (DEPO-MEDROL) injection  80 mg    Orders Placed This Encounter  Procedures  . Facet Injection  . XR C-ARM NO REPORT    Follow-up: Return if symptoms worsen or fail to improve.   Procedures: No procedures performed  Lumbar Diagnostic Facet Joint Nerve Block with Fluoroscopic Guidance   Patient: Rick Melendez      Date of Birth: Aug 16, 1936 MRN: [id number] PCP: Jani Gravel, MD      Visit Date: 06/30/2018   Universal  Protocol:    Date/Time: 07/25/196:38 AM  Consent Given By: the patient  Position: PRONE  Additional Comments: Vital signs were monitored before and after the procedure. Patient was prepped and draped in the usual sterile fashion. The correct patient, procedure, and site was verified.   Injection Procedure Details:  Procedure Site One Meds Administered:  Meds ordered this encounter  Medications  . methylPREDNISolone acetate (DEPO-MEDROL) injection 80 mg     Laterality: Bilateral  Location/Site:  L4-L5 L5-S1  Needle size: 22 ga.  Needle type:spinal  Needle Placement: Oblique pedical  Findings:   -Comments: There was excellent flow of contrast along the articular pillars without intravascular flow.  Procedure Details: The fluoroscope beam is vertically oriented in AP and then obliqued 15 to 20 degrees to the ipsilateral side of the desired nerve to achieve the "Scotty dog" appearance.  The skin over the target area of the junction of the superior articulating process and the transverse process (sacral ala if blocking the L5 dorsal rami) was locally anesthetized with a 1 ml volume of 1% Lidocaine without Epinephrine.  The spinal needle was inserted and advanced in a trajectory view down to the target.   After contact with periosteum and negative aspirate for blood and CSF, correct placement without intravascular or epidural spread was confirmed by injecting 0.5 ml. of Isovue-250.  A spot radiograph was obtained of this image.    Next, a 0.5 ml. volume of the injectate described above was injected. The needle was then redirected to the other facet joint nerves mentioned above if needed.  Prior to the procedure, the patient was given a Pain Diary which was completed for baseline measurements.  After the procedure, the patient rated their pain every 30 minutes and will continue rating at this frequency for a total of 5 hours.  The patient has been asked to complete the Diary and  return to [place] by mail, fax or hand delivered as soon as possible.   Additional Comments:  The patient tolerated the procedure well Dressing: Band-Aid    Post-procedure details: Patient was observed during the procedure. Post-procedure instructions were reviewed.  Patient left the clinic in stable condition.   Clinical History: L4-5: Advanced facet hypertrophy is present. There is uncovering of a broad-based disc herniation. Moderate lateral recess narrowing is worse on the left. Mild to moderate left and moderate right foraminal stenosis is evident.  L5-S1: A leftward disc protrusion is present. Mild facet hypertrophy is noted. This results in mild left lateral recess narrowing. The foramina are patent.  IMPRESSION: 1. The most significant right-sided disease is at L3-4 and L4-5. 2. Moderate right and mild left foraminal stenosis at L3-4 with mild lateral recess narrowing bilaterally. 3. Advanced facet hypertrophy and spurring, and broad-based disc protrusion, and anterolisthesis leads to moderate lateral recess narrowing at L4-5, worse on the left. 4. Mild to moderate left and moderate right foraminal stenosis at L4-5. 5. Mild left lateral recess narrowing at L5-S1. 6. Mild left lateral recess and moderate left foraminal  stenosis at T12-L1. 7. Mild lateral recess narrowing at L1-2 is worse on the left.   Electronically Signed   By: Lawrence Santiago M.D.   On: 05/27/2014 17:56   He reports that he quit smoking about 36 years ago. His smoking use included cigarettes. His smokeless tobacco use includes chew. No results for input(s): HGBA1C, LABURIC in the last 8760 hours.  Objective:  VS:  HT:    WT:   BMI:     BP:(!) 143/67  HR:(!) 52bpm  TEMP: ( )  RESP:  Physical Exam  Constitutional: He is oriented to person, place, and time. He appears well-developed and well-nourished. No distress.  HENT:  Head: Normocephalic and atraumatic.  Eyes: Pupils are equal, round,  and reactive to light. Conjunctivae are normal.  Neck: Neck supple. No tracheal deviation present.  Cardiovascular: Regular rhythm and intact distal pulses.  Pulmonary/Chest: Effort normal. No respiratory distress.  Abdominal: Soft. There is no tenderness. There is no guarding.  Musculoskeletal:  Patient has a little bit of trouble going sit to stand a full extension and he does have a concordant low back pain with extension and facet joint loading.  He has no pain over the greater trochanters and no pain with hip rotation he has good distal strength.  Lymphadenopathy:    He has no cervical adenopathy.  Neurological: He is alert and oriented to person, place, and time.  Skin: Skin is warm and dry. No rash noted. No erythema.  Psychiatric: He has a normal mood and affect.  Nursing note and vitals reviewed.   Ortho Exam Imaging: No results found.  Past Medical/Family/Surgical/Social History: Medications & Allergies reviewed per EMR, new medications updated. Patient Active Problem List   Diagnosis Date Noted  . Influenza B   . AKI (acute kidney injury) ([place])   . CAP (community acquired pneumonia) 03/19/2017  . Bilateral primary osteoarthritis of knee 01/28/2017  . Impingement syndrome of right shoulder 01/28/2017  . CAD - CABG X 2 9/07 LIMA-LAD, SVG-OM. Low risk Myoview 7/12 09/07/2013  . HTN (hypertension) 09/07/2013  . Dyslipidemia 09/07/2013  . Diabetes mellitus ([place]) 09/07/2013  . Chronic renal insufficiency, stage III (moderate)- SCr 1.6 (Aug 2013) 09/07/2013  . Sleep apnea- compliant with C-pap 09/07/2013  . PVD (peripheral vascular disease)- Rt iliac disease by doppler 7/13 09/07/2013   Past Medical History:  Diagnosis Date  . Arthritis   . Coronary artery disease 2007   cabg x3  . Diabetes mellitus without complication ([place])   . Hyperlipidemia   . Hypertension   . Influenza B 03/19/2017  . OSA on CPAP    Dr Claiborne Billings - follows and treatmentof sleep apnea  . PVD  (peripheral vascular disease) ([place]) 2003   DR BERRY-  -LEFT LEG STENTING   Family History  Problem Relation Age of Onset  . Cancer Father   . Cancer Brother    Past Surgical History:  Procedure Laterality Date  . CARDIAC CATHETERIZATION  0911/2007   3 vessel CAD WITH LEFT MAIN CAD SEVERE ,norm lLIMA,LV nom. ,evidence for very mild aortic stenosis with gradient, mild stenosis distal abdnminal aotra and proximal left common iliac artery 30%  . CORONARY ARTERY BYPASS GRAFT  08/31/2006   DR GERHARDT---LIMA to LAD, VEIN TO AN OBTUSE MARGINAL BRANCH AND  RIGHT CORONARY ARTERY  . CPET  04/13/2012   normal  . DOPPLER ECHOCARDIOGRAPHY  07/08/2011   EF =>55%  . lower arterial doppler  07/01/2012   mildly abnormal lower extremity;ABIs >1  bilaterally with moderately high-grade right iliac stenosis that had not changed from proir study.  [name] NM MYOCAR PERF WALL MOTION  7/182012   EF 59%, normal myocardial perfusio   Social History   Occupational History  . Not on file  Tobacco Use  . Smoking status: Former Smoker    Types: Cigarettes    Last attempt to quit: 09/03/1981    Years since quitting: 36.8  . Smokeless tobacco: Current User    Types: Chew  Substance and Sexual Activity  . Alcohol use: Yes  . Drug use: No  . Sexual activity: Not on file

## 2018-07-20 DIAGNOSIS — I10 Essential (primary) hypertension: Secondary | ICD-10-CM | POA: Diagnosis not present

## 2018-07-20 DIAGNOSIS — N390 Urinary tract infection, site not specified: Secondary | ICD-10-CM | POA: Diagnosis not present

## 2018-07-20 DIAGNOSIS — Z79899 Other long term (current) drug therapy: Secondary | ICD-10-CM | POA: Diagnosis not present

## 2018-07-20 DIAGNOSIS — E118 Type 2 diabetes mellitus with unspecified complications: Secondary | ICD-10-CM | POA: Diagnosis not present

## 2018-07-20 DIAGNOSIS — Z5181 Encounter for therapeutic drug level monitoring: Secondary | ICD-10-CM | POA: Diagnosis not present

## 2018-08-03 DIAGNOSIS — L821 Other seborrheic keratosis: Secondary | ICD-10-CM | POA: Diagnosis not present

## 2018-08-03 DIAGNOSIS — L570 Actinic keratosis: Secondary | ICD-10-CM | POA: Diagnosis not present

## 2018-08-03 DIAGNOSIS — C44629 Squamous cell carcinoma of skin of left upper limb, including shoulder: Secondary | ICD-10-CM | POA: Diagnosis not present

## 2018-08-03 DIAGNOSIS — Z85828 Personal history of other malignant neoplasm of skin: Secondary | ICD-10-CM | POA: Diagnosis not present

## 2018-08-30 DIAGNOSIS — I2510 Atherosclerotic heart disease of native coronary artery without angina pectoris: Secondary | ICD-10-CM | POA: Diagnosis not present

## 2018-08-30 DIAGNOSIS — R319 Hematuria, unspecified: Secondary | ICD-10-CM | POA: Diagnosis not present

## 2018-08-30 DIAGNOSIS — N390 Urinary tract infection, site not specified: Secondary | ICD-10-CM | POA: Diagnosis not present

## 2018-08-30 DIAGNOSIS — E119 Type 2 diabetes mellitus without complications: Secondary | ICD-10-CM | POA: Diagnosis not present

## 2018-08-30 DIAGNOSIS — G8929 Other chronic pain: Secondary | ICD-10-CM | POA: Diagnosis not present

## 2018-08-30 DIAGNOSIS — I10 Essential (primary) hypertension: Secondary | ICD-10-CM | POA: Diagnosis not present

## 2018-08-30 DIAGNOSIS — Z23 Encounter for immunization: Secondary | ICD-10-CM | POA: Diagnosis not present

## 2018-09-06 DIAGNOSIS — N411 Chronic prostatitis: Secondary | ICD-10-CM | POA: Diagnosis not present

## 2018-09-06 DIAGNOSIS — N3021 Other chronic cystitis with hematuria: Secondary | ICD-10-CM | POA: Diagnosis not present

## 2018-10-17 DIAGNOSIS — I10 Essential (primary) hypertension: Secondary | ICD-10-CM | POA: Diagnosis not present

## 2018-10-17 DIAGNOSIS — R319 Hematuria, unspecified: Secondary | ICD-10-CM | POA: Diagnosis not present

## 2018-10-17 DIAGNOSIS — E118 Type 2 diabetes mellitus with unspecified complications: Secondary | ICD-10-CM | POA: Diagnosis not present

## 2018-10-17 DIAGNOSIS — Z79899 Other long term (current) drug therapy: Secondary | ICD-10-CM | POA: Diagnosis not present

## 2018-10-18 DIAGNOSIS — N3001 Acute cystitis with hematuria: Secondary | ICD-10-CM | POA: Diagnosis not present

## 2018-10-24 ENCOUNTER — Telehealth (INDEPENDENT_AMBULATORY_CARE_PROVIDER_SITE_OTHER): Payer: Self-pay | Admitting: Physical Medicine and Rehabilitation

## 2018-10-24 NOTE — Telephone Encounter (Signed)
Notification or Prior Authorization is not required for the requested services  This [employer] members plan does not currently require a prior authorization for [phone] and [phone].  Called pt wife at [phone] and lvm #[phone].

## 2018-10-25 DIAGNOSIS — G8929 Other chronic pain: Secondary | ICD-10-CM | POA: Diagnosis not present

## 2018-10-25 DIAGNOSIS — E7800 Pure hypercholesterolemia, unspecified: Secondary | ICD-10-CM | POA: Diagnosis not present

## 2018-10-25 DIAGNOSIS — I10 Essential (primary) hypertension: Secondary | ICD-10-CM | POA: Diagnosis not present

## 2018-10-25 DIAGNOSIS — L918 Other hypertrophic disorders of the skin: Secondary | ICD-10-CM | POA: Diagnosis not present

## 2018-10-25 DIAGNOSIS — E119 Type 2 diabetes mellitus without complications: Secondary | ICD-10-CM | POA: Diagnosis not present

## 2018-10-28 NOTE — Telephone Encounter (Signed)
Scheduled for 11/20 with driver.

## 2018-11-09 ENCOUNTER — Encounter (INDEPENDENT_AMBULATORY_CARE_PROVIDER_SITE_OTHER): Payer: Self-pay | Admitting: Physical Medicine and Rehabilitation

## 2018-11-09 ENCOUNTER — Ambulatory Visit (INDEPENDENT_AMBULATORY_CARE_PROVIDER_SITE_OTHER): Payer: Medicare Other | Admitting: Physical Medicine and Rehabilitation

## 2018-11-09 ENCOUNTER — Ambulatory Visit (INDEPENDENT_AMBULATORY_CARE_PROVIDER_SITE_OTHER): Payer: Self-pay

## 2018-11-09 VITALS — BP 138/62 | HR 52 | Temp 98.9°F

## 2018-11-09 DIAGNOSIS — M545 Low back pain: Secondary | ICD-10-CM

## 2018-11-09 DIAGNOSIS — M47816 Spondylosis without myelopathy or radiculopathy, lumbar region: Secondary | ICD-10-CM | POA: Diagnosis not present

## 2018-11-09 DIAGNOSIS — G8929 Other chronic pain: Secondary | ICD-10-CM

## 2018-11-09 MED ORDER — BUPIVACAINE HCL 0.5 % IJ SOLN
3.0000 mL | Freq: Once | INTRAMUSCULAR | Status: AC
  Administered 2018-11-09: 3 mL

## 2018-11-09 MED ORDER — METHYLPREDNISOLONE ACETATE 80 MG/ML IJ SUSP
80.0000 mg | Freq: Once | INTRAMUSCULAR | Status: AC
  Administered 2018-11-09: 80 mg

## 2018-11-09 NOTE — Progress Notes (Signed)
  Numeric Pain Rating Scale and Functional Assessment Average Pain 5   In the last MONTH (on 0-10 scale) has pain interfered with the following?  1. General activity like being  able to carry out your everyday physical activities such as walking, climbing stairs, carrying groceries, or moving a chair?  Rating(7)   +Driver, -BT, -Dye Allergies. 

## 2018-11-09 NOTE — Patient Instructions (Addendum)
[employer] Discharge Instructions  *At any time if you have questions or concerns they can be answered by calling [phone]  All Patients: . You may experience an increase in your symptoms for the first 2 days (it can take 2 days to 2 weeks for the steroid/cortisone to have its maximal effect). . You may use ice to the site for the first 24 hours; 20 minutes on and 20 minutes off and may use heat after that time. . You may resume and continue your current pain medications. If you need a refill please contact the prescribing physician. . You may resume your medications if any were stopped for the procedure. . You may shower but no swimming, tub bath or Jacuzzi for 24 hours. . Please remove bandage after 4 hours. . You may resume light activities as tolerated. . If you had Spine Injection, you should not drive for the next 3 hours due to anesthetics used in the procedure. Please have someone drive for you.  *If you have had sedation, Valium, Xanax, or lorazepam: Do not drive or use public transportation for 24 hours, do not operating hazardous machinery or make important personal/business decisions for 24 hours.  POSSIBLE STEROID SIDE EFFECTS: If experienced these should only last for a short period. Change in menstrual flow  Edema in (swelling)  Increased appetite Skin flushing (redness)  Skin rash/acne  Thrush (oral) Vaginitis    Increased sweating  Depression Increased blood glucose levels Cramping and leg/calf  Euphoria (feeling happy)  POSSIBLE PROCEDURE SIDE EFFECTS: Please call our office if concerned. Increased pain Increased numbness/tingling  Headache Nausea/vomiting Hematoma (bruising/bleeding) Edema (swelling at the site) Weakness  Infection (red/drainage at site) Fever greater than 100.3F  *In the event of a headache after epidural steroid injection: Drink plenty of fluids, especially water and try to lay flat when possible. If the headache does not get  better after a few days or as always if concerned please call the office.  Dr. Tressie Ellis McGill  "The Big Three" that will safely increase your endurance and protect your back: modified curl-up, side bridge, and bird dog.  1. Modified Curl-Up Lie your back with one knee bent and one knee straight, this puts your pelvis in a neutral position and the muscles of your core in an optimal alignment of pull to avoid strain on the low back. Place your hands under the arch of your low back and ensure that this arch is maintained throughout the curl-up. Start by bracing your abdomen; this is different from flexing your abs, bear down through your belly. Now make sure you can take a breath in and a breath out while maintaining this brace. If you cannot, stop there and practice doing just that until you've got it mastered! Now, pretend that your spine in your neck and your upper back are cemented together and do not move independently. Pick a spot on the ceiling and focus your gaze there, lift your shoulder blades about 30 off the floor and slowly return to the start position. Take note of your neck, and ensure that your chin isn't poking forward when you do a curl up. If you're struggling with that, focus on making a double chin. Perform 3 sets of 10-12.  2. Side Bridge Lie on your side and prop yourself up on your elbow. Ensure that your elbow is directly under your shoulder to avoid any unnecessary strain through your shoulder joint. With your legs straight, place your top foot on the  ground in front of your bottom foot. Place your top hand on your bottom shoulder. While maintaining the natural curve of your spine, that is to say, be sure that your upper body isn't twisted or leaning forward, brace your abdomen, squeeze through your gluteals (clench your bum), and lift your hips up off the ground. Don't forget to breathe! Hold for 8-10 seconds, repeat 3 times. As the exercise becomes easier, increase the number of  repetitions as opposed to the length of time. There are a number of ways to modify this exercise in order to increase or decrease the difficulty such as the example below on the right. If it's not challenging enough, try putting that top hand on your top hip, or straight up in the air, but again, be sure your body stays straight!  3. Bird Dog Start on your hands and knees with your hands shoulder width apart directly under your shoulders, and knees hip width apart directly under your hips. Maintain a neutral spine. Brace through your abdomen and squeeze your gluteals. Ensure you can maintain this while you take a breath in and out. Lift your right arm in front until it's level with your shoulder, squeezing the muscles between your shoulder blades as you do so. At the same time, extend your left leg straight back until it is level with your hips, squeezing your gluteals, and keeping your hips square to the floor. Return to the starting position in a slow and controlled manner, and perform the same action with the left arm and right leg. That is one repetition. Perform 3 sets of 8-10 repetitions. As this exercise becomes easy, focus on co-contracting the muscles of your forearm and arms while you extend, the same goes for the muscles of your legs. For an additional challenge, instead of putting your hand and knee back down on the ground between reps, try just sweeping the floor and performing the next rep right away, or draw a square with your arm and leg and then sweep the floor.  *Avoid exercises that forward flex the spine or extend the spine.

## 2018-11-09 NOTE — Progress Notes (Signed)
Rick Melendez - 82 y.o. male MRN [id number]  Date of birth: 1936-06-29  Office Visit Note: Visit Date: 11/09/2018 PCP: Jani Gravel, MD Referred by: Jani Gravel, MD  Subjective: Chief Complaint  Patient presents with  . Lower Back - Pain  . Spine - Follow-up   HPI: Rick Melendez is a 82 y.o. male who comes in today For planned second diagnostic facet joint block at L4-5 and L5-S1.  Over the years she has had intermittent facet joint block with long-term relief.  Over the last year or so he has gotten very short-term relief.  Last injection was diagnostic medial branch block was diagnostic they gave him 60% relief overall of his axial low back pain.  His pain is axial low back pain worse with standing better at rest worse with activity and doing things around the house.  He used to be pretty active in the garden but they are sure they had the pair things down because of his back pain.  He had been going to the [facility] and exercising mostly on the treadmill and exercise bike.  He has had physical therapy in the past and does try to do stretching at home exercises.  Really has failed home exercise program at this point.  He continues to take a small amount of hydrocodone when the pain is severe.  He is on anticoagulation with cardiac history.  He has MRI showing advanced facet arthropathy at L4-5 and more moderate at L5-S1.  He does not have any focal nerve compression.  He has no radicular complaints.  He said no new trauma since of seen him.  He has not had a history of falls or balance difficulties.  Exam is been consistent with facet joint mediated pain and at least one diagnostic medial branch block is been positive for 60% relief.  Review of Systems  Constitutional: Negative for chills, fever, malaise/fatigue and weight loss.  HENT: Negative for hearing loss and sinus pain.   Eyes: Negative for blurred vision, double vision and photophobia.  Respiratory: Negative for cough and shortness of  breath.   Cardiovascular: Negative for chest pain, palpitations and leg swelling.  Gastrointestinal: Negative for abdominal pain, nausea and vomiting.  Genitourinary: Negative for flank pain.  Musculoskeletal: Positive for back pain. Negative for myalgias.  Skin: Negative for itching and rash.  Neurological: Negative for tremors, focal weakness and weakness.  Endo/Heme/Allergies: Negative.   Psychiatric/Behavioral: Negative for depression.  All other systems reviewed and are negative.  Otherwise per HPI.  Assessment & Plan: Visit Diagnoses:  1. Spondylosis without myelopathy or radiculopathy, lumbar region   2. Chronic bilateral low back pain without sciatica     Plan: Findings:  Chronic severe recalcitrant axial low back pain consistent with facet joint mediated low back pain with pain with facet joint loading on exam as well as pain with standing more than sitting and with activity.  No radicular pain down the legs.  Imaging showing facet arthropathy more probably at L4-5 less prominent L5-S1.  No focal nerve compression on MRI.  Has failed home exercise program and prior physical therapy.  Continues to go to the [facility].  We had a long discussion with her today about exercises at the [facility] that he should avoid and ones that it would be worthwhile doing.  Like for him to really start to strengthen his back more than just writing on the bicycle or treadmill.  We also gave him some home exercises to complete for  core strengthening.  Today we are going to complete a second diagnostic medial branch block.  Depending on his relief would look at radiofrequency ablation.  Case is comp gated by history of diabetes as well as anticoagulation.    Meds & Orders:  Meds ordered this encounter  Medications  . methylPREDNISolone acetate (DEPO-MEDROL) injection 80 mg  . bupivacaine (MARCAINE) 0.5 % (with pres) injection 3 mL    Orders Placed This Encounter  Procedures  . Facet Injection  . XR C-ARM NO  REPORT    Follow-up: Return if symptoms worsen or fail to improve, for Review Pain Diary.   Procedures: No procedures performed  Lumbar Diagnostic Facet Joint Nerve Block with Fluoroscopic Guidance   Patient: Rick Melendez      Date of Birth: 20-Mar-1936 MRN: [id number] PCP: Jani Gravel, MD      Visit Date: 11/09/2018   Universal Protocol:    Date/Time: 11/21/195:37 AM  Consent Given By: the patient  Position: PRONE  Additional Comments: Vital signs were monitored before and after the procedure. Patient was prepped and draped in the usual sterile fashion. The correct patient, procedure, and site was verified.   Injection Procedure Details:  Procedure Site One Meds Administered:  Meds ordered this encounter  Medications  . methylPREDNISolone acetate (DEPO-MEDROL) injection 80 mg  . bupivacaine (MARCAINE) 0.5 % (with pres) injection 3 mL     Laterality: Bilateral  Location/Site:  L4-L5 L5-S1  Needle size: 22 ga.  Needle type:spinal  Needle Placement: Oblique pedical  Findings:   -Comments: There was excellent flow of contrast along the articular pillars without intravascular flow.  Procedure Details: The fluoroscope beam is vertically oriented in AP and then obliqued 15 to 20 degrees to the ipsilateral side of the desired nerve to achieve the "Scotty dog" appearance.  The skin over the target area of the junction of the superior articulating process and the transverse process (sacral ala if blocking the L5 dorsal rami) was locally anesthetized with a 1 ml volume of 1% Lidocaine without Epinephrine.  The spinal needle was inserted and advanced in a trajectory view down to the target.   After contact with periosteum and negative aspirate for blood and CSF, correct placement without intravascular or epidural spread was confirmed by injecting 0.5 ml. of Isovue-250.  A spot radiograph was obtained of this image.    Next, a 0.5 ml. volume of the injectate described above  was injected. The needle was then redirected to the other facet joint nerves mentioned above if needed.  Prior to the procedure, the patient was given a Pain Diary which was completed for baseline measurements.  After the procedure, the patient rated their pain every 30 minutes and will continue rating at this frequency for a total of 5 hours.  The patient has been asked to complete the Diary and return to [place] by mail, fax or hand delivered as soon as possible.   Additional Comments:  The patient tolerated the procedure well Dressing: Band-Aid    Post-procedure details: Patient was observed during the procedure. Post-procedure instructions were reviewed.  Patient left the clinic in stable condition.   Clinical History: L4-5: Advanced facet hypertrophy is present. There is uncovering of a broad-based disc herniation. Moderate lateral recess narrowing is worse on the left. Mild to moderate left and moderate right foraminal stenosis is evident.  L5-S1: A leftward disc protrusion is present. Mild facet hypertrophy is noted. This results in mild left lateral recess narrowing. The foramina are  patent.  IMPRESSION: 1. The most significant right-sided disease is at L3-4 and L4-5. 2. Moderate right and mild left foraminal stenosis at L3-4 with mild lateral recess narrowing bilaterally. 3. Advanced facet hypertrophy and spurring, and broad-based disc protrusion, and anterolisthesis leads to moderate lateral recess narrowing at L4-5, worse on the left. 4. Mild to moderate left and moderate right foraminal stenosis at L4-5. 5. Mild left lateral recess narrowing at L5-S1. 6. Mild left lateral recess and moderate left foraminal stenosis at T12-L1. 7. Mild lateral recess narrowing at L1-2 is worse on the left.   Electronically Signed   By: Lawrence Santiago M.D.   On: 05/27/2014 17:56   He reports that he quit smoking about 37 years ago. His smoking use included cigarettes. His  smokeless tobacco use includes chew. No results for input(s): HGBA1C, LABURIC in the last 8760 hours.  Objective:  VS:  HT:    WT:   BMI:     BP:138/62  HR:(!) 52bpm  TEMP:98.9 F (37.2 C)(Oral)  RESP:92 % Physical Exam  Constitutional: He is oriented to person, place, and time. He appears well-developed and well-nourished. No distress.  HENT:  Head: Normocephalic and atraumatic.  Eyes: Pupils are equal, round, and reactive to light. Conjunctivae are normal.  Neck: Normal range of motion. Neck supple.  Cardiovascular: Regular rhythm and intact distal pulses.  Pulmonary/Chest: Effort normal. No respiratory distress.  Musculoskeletal:  Patient is very slow to go from sit to full extension and has pain with facet joint loading and extension of the lumbar spine and this is concordant with his back pain.  He has no focal trigger points.  He is mildly tender over the PSIS bilaterally no pain over the greater trochanters and no pain with hip rotation has good distal strength without clonus.  He does ambulate without aid with a forward flexed lumbar spine.  Neurological: He is alert and oriented to person, place, and time. He exhibits normal muscle tone. Coordination normal.  Skin: Skin is warm and dry. No rash noted. No erythema.  Psychiatric: He has a normal mood and affect.  Nursing note and vitals reviewed.   Ortho Exam Imaging: Xr C-arm No Report  Result Date: 11/09/2018 Please see Notes tab for imaging impression.   Past Medical/Family/Surgical/Social History: Medications & Allergies reviewed per EMR, new medications updated. Patient Active Problem List   Diagnosis Date Noted  . Influenza B   . AKI (acute kidney injury) ([place])   . CAP (community acquired pneumonia) 03/19/2017  . Bilateral primary osteoarthritis of knee 01/28/2017  . Impingement syndrome of right shoulder 01/28/2017  . CAD - CABG X 2 9/07 LIMA-LAD, SVG-OM. Low risk Myoview 7/12 09/07/2013  . HTN (hypertension)  09/07/2013  . Dyslipidemia 09/07/2013  . Diabetes mellitus ([place]) 09/07/2013  . Chronic renal insufficiency, stage III (moderate)- SCr 1.6 (Aug 2013) 09/07/2013  . Sleep apnea- compliant with C-pap 09/07/2013  . PVD (peripheral vascular disease)- Rt iliac disease by doppler 7/13 09/07/2013   Past Medical History:  Diagnosis Date  . Arthritis   . Coronary artery disease 2007   cabg x3  . Diabetes mellitus without complication ([place])   . Hyperlipidemia   . Hypertension   . Influenza B 03/19/2017  . OSA on CPAP    Dr Claiborne Billings - follows and treatmentof sleep apnea  . PVD (peripheral vascular disease) ([place]) 2003   DR BERRY-  -LEFT LEG STENTING   Family History  Problem Relation Age of Onset  . Cancer Father   .  Cancer Brother    Past Surgical History:  Procedure Laterality Date  . CARDIAC CATHETERIZATION  0911/2007   3 vessel CAD WITH LEFT MAIN CAD SEVERE ,norm lLIMA,LV nom. ,evidence for very mild aortic stenosis with gradient, mild stenosis distal abdnminal aotra and proximal left common iliac artery 30%  . CORONARY ARTERY BYPASS GRAFT  08/31/2006   DR GERHARDT---LIMA to LAD, VEIN TO AN OBTUSE MARGINAL BRANCH AND  RIGHT CORONARY ARTERY  . CPET  04/13/2012   normal  . DOPPLER ECHOCARDIOGRAPHY  07/08/2011   EF =>55%  . lower arterial doppler  07/01/2012   mildly abnormal lower extremity;ABIs >1 bilaterally with moderately high-grade right iliac stenosis that had not changed from proir study.  [name] NM MYOCAR PERF WALL MOTION  7/182012   EF 59%, normal myocardial perfusio   Social History   Occupational History  . Not on file  Tobacco Use  . Smoking status: Former Smoker    Types: Cigarettes    Last attempt to quit: 09/03/1981    Years since quitting: 37.2  . Smokeless tobacco: Current User    Types: Chew  Substance and Sexual Activity  . Alcohol use: Yes  . Drug use: No  . Sexual activity: Not on file

## 2018-11-10 NOTE — Procedures (Signed)
Lumbar Diagnostic Facet Joint Nerve Block with Fluoroscopic Guidance   Patient: Rick Melendez      Date of Birth: July 05, 1936 MRN: [id number] PCP: Jani Gravel, MD      Visit Date: 11/09/2018   Universal Protocol:    Date/Time: 11/21/195:37 AM  Consent Given By: the patient  Position: PRONE  Additional Comments: Vital signs were monitored before and after the procedure. Patient was prepped and draped in the usual sterile fashion. The correct patient, procedure, and site was verified.   Injection Procedure Details:  Procedure Site One Meds Administered:  Meds ordered this encounter  Medications  . methylPREDNISolone acetate (DEPO-MEDROL) injection 80 mg  . bupivacaine (MARCAINE) 0.5 % (with pres) injection 3 mL     Laterality: Bilateral  Location/Site:  L4-L5 L5-S1  Needle size: 22 ga.  Needle type:spinal  Needle Placement: Oblique pedical  Findings:   -Comments: There was excellent flow of contrast along the articular pillars without intravascular flow.  Procedure Details: The fluoroscope beam is vertically oriented in AP and then obliqued 15 to 20 degrees to the ipsilateral side of the desired nerve to achieve the "Scotty dog" appearance.  The skin over the target area of the junction of the superior articulating process and the transverse process (sacral ala if blocking the L5 dorsal rami) was locally anesthetized with a 1 ml volume of 1% Lidocaine without Epinephrine.  The spinal needle was inserted and advanced in a trajectory view down to the target.   After contact with periosteum and negative aspirate for blood and CSF, correct placement without intravascular or epidural spread was confirmed by injecting 0.5 ml. of Isovue-250.  A spot radiograph was obtained of this image.    Next, a 0.5 ml. volume of the injectate described above was injected. The needle was then redirected to the other facet joint nerves mentioned above if needed.  Prior to the procedure, the  patient was given a Pain Diary which was completed for baseline measurements.  After the procedure, the patient rated their pain every 30 minutes and will continue rating at this frequency for a total of 5 hours.  The patient has been asked to complete the Diary and return to [place] by mail, fax or hand delivered as soon as possible.   Additional Comments:  The patient tolerated the procedure well Dressing: Band-Aid    Post-procedure details: Patient was observed during the procedure. Post-procedure instructions were reviewed.  Patient left the clinic in stable condition.

## 2018-12-01 ENCOUNTER — Ambulatory Visit (INDEPENDENT_AMBULATORY_CARE_PROVIDER_SITE_OTHER): Payer: Medicare Other | Admitting: Orthopedic Surgery

## 2018-12-01 ENCOUNTER — Encounter (INDEPENDENT_AMBULATORY_CARE_PROVIDER_SITE_OTHER): Payer: Self-pay | Admitting: Orthopedic Surgery

## 2018-12-01 VITALS — Ht 66.0 in | Wt 220.0 lb

## 2018-12-01 DIAGNOSIS — M170 Bilateral primary osteoarthritis of knee: Secondary | ICD-10-CM

## 2018-12-04 ENCOUNTER — Encounter (INDEPENDENT_AMBULATORY_CARE_PROVIDER_SITE_OTHER): Payer: Self-pay | Admitting: Orthopedic Surgery

## 2018-12-04 NOTE — Progress Notes (Signed)
Office Visit Note   Patient: Rick Melendez           Date of Birth: [birth year]           MRN: [id number] Visit Date: 12/01/2018              Requested by: Jani Gravel, [address] [place], [place] [address] PCP: Jani Gravel, MD  Chief Complaint  Patient presents with  . Right Knee - Follow-up  . Left Knee - Follow-up      HPI: Patient is an 82 year old gentleman who presents complaining of chronic osteoarthritis both knees.  He states he has sharp pain with ambulation.  He states the left knee is worse than the right.  Patient has had temporary relief from previous steroid injections.  Assessment & Plan: Visit Diagnoses:  1. Bilateral primary osteoarthritis of knee     Plan: Patient will follow-up as needed.  Discussed that if he gets good interval relief with the steroid injections he may be a good candidate for hyaluronic acid injection he will call us if he wants to proceed with a hyaluronic acid injection.  Follow-Up Instructions: Return if symptoms worsen or fail to improve.   Ortho Exam  Patient is alert, oriented, no adenopathy, well-dressed, normal affect, normal respiratory effort. Examination patient has crepitation with range of motion of both knees.  Both knees the collaterals and cruciates are stable there is no effusion.  Patient is tender to palpation the patellofemoral joint as well as medial lateral joint lines bilaterally.  Imaging: No results found. No images are attached to the encounter.  Labs: Lab Results  Component Value Date   REPTSTATUS 03/24/2017 FINAL 03/19/2017   CULT NO GROWTH 5 DAYS 03/19/2017     No results found for: ALBUMIN, PREALBUMIN, LABURIC  Body mass index is 35.51 kg/m.  Orders:  Orders Placed This Encounter  Procedures  . Large Joint Inj   No orders of the defined types were placed in this encounter.    Procedures: Large Joint Inj: bilateral knee on 12/04/2018 10:43 AM Indications: pain and  diagnostic evaluation Details: 22 G 1.5 in needle, anteromedial approach  Arthrogram: No  Outcome: tolerated well, no immediate complications Procedure, treatment alternatives, risks and benefits explained, specific risks discussed. Consent was given by the patient. Immediately prior to procedure a time out was called to verify the correct patient, procedure, equipment, support staff and site/side marked as required. Patient was prepped and draped in the usual sterile fashion.      Clinical Data: No additional findings.  ROS:  All other systems negative, except as noted in the HPI. Review of Systems  Objective: Vital Signs: Ht 5\' 6"  (1.676 m)   Wt 220 lb (99.8 kg)   BMI 35.51 kg/m   Specialty Comments:  No specialty comments available.  PMFS History: Patient Active Problem List   Diagnosis Date Noted  . Influenza B   . AKI (acute kidney injury) ([place])   . CAP (community acquired pneumonia) 03/19/2017  . Bilateral primary osteoarthritis of knee 01/28/2017  . Impingement syndrome of right shoulder 01/28/2017  . CAD - CABG X 2 9/07 LIMA-LAD, SVG-OM. Low risk Myoview 7/12 09/07/2013  . HTN (hypertension) 09/07/2013  . Dyslipidemia 09/07/2013  . Diabetes mellitus ([place]) 09/07/2013  . Chronic renal insufficiency, stage III (moderate)- SCr 1.6 (Aug 2013) 09/07/2013  . Sleep apnea- compliant with C-pap 09/07/2013  . PVD (peripheral vascular disease)- Rt iliac disease by doppler 7/13 09/07/2013  Past Medical History:  Diagnosis Date  . Arthritis   . Coronary artery disease 2007   cabg x3  . Diabetes mellitus without complication ([place])   . Hyperlipidemia   . Hypertension   . Influenza B 03/19/2017  . OSA on CPAP    Dr Claiborne Billings - follows and treatmentof sleep apnea  . PVD (peripheral vascular disease) ([place]) 2003   DR BERRY-  -LEFT LEG STENTING    Family History  Problem Relation Age of Onset  . Cancer Father   . Cancer Brother     Past Surgical History:  Procedure  Laterality Date  . CARDIAC CATHETERIZATION  0911/2007   3 vessel CAD WITH LEFT MAIN CAD SEVERE ,norm lLIMA,LV nom. ,evidence for very mild aortic stenosis with gradient, mild stenosis distal abdnminal aotra and proximal left common iliac artery 30%  . CORONARY ARTERY BYPASS GRAFT  08/31/2006   DR GERHARDT---LIMA to LAD, VEIN TO AN OBTUSE MARGINAL BRANCH AND  RIGHT CORONARY ARTERY  . CPET  04/13/2012   normal  . DOPPLER ECHOCARDIOGRAPHY  07/08/2011   EF =>55%  . lower arterial doppler  07/01/2012   mildly abnormal lower extremity;ABIs >1 bilaterally with moderately high-grade right iliac stenosis that had not changed from proir study.  [name] NM MYOCAR PERF WALL MOTION  7/182012   EF 59%, normal myocardial perfusio   Social History   Occupational History  . Not on file  Tobacco Use  . Smoking status: Former Smoker    Types: Cigarettes    Last attempt to quit: 09/03/1981    Years since quitting: 37.2  . Smokeless tobacco: Current User    Types: Chew  Substance and Sexual Activity  . Alcohol use: Yes  . Drug use: No  . Sexual activity: Not on file

## 2018-12-19 DIAGNOSIS — G4733 Obstructive sleep apnea (adult) (pediatric): Secondary | ICD-10-CM | POA: Diagnosis not present

## 2019-02-08 DIAGNOSIS — Z85828 Personal history of other malignant neoplasm of skin: Secondary | ICD-10-CM | POA: Diagnosis not present

## 2019-02-08 DIAGNOSIS — L570 Actinic keratosis: Secondary | ICD-10-CM | POA: Diagnosis not present

## 2019-02-08 DIAGNOSIS — L821 Other seborrheic keratosis: Secondary | ICD-10-CM | POA: Diagnosis not present

## 2019-02-22 DIAGNOSIS — E119 Type 2 diabetes mellitus without complications: Secondary | ICD-10-CM | POA: Diagnosis not present

## 2019-02-22 DIAGNOSIS — I10 Essential (primary) hypertension: Secondary | ICD-10-CM | POA: Diagnosis not present

## 2019-02-22 DIAGNOSIS — G8929 Other chronic pain: Secondary | ICD-10-CM | POA: Diagnosis not present

## 2019-02-22 DIAGNOSIS — E7800 Pure hypercholesterolemia, unspecified: Secondary | ICD-10-CM | POA: Diagnosis not present

## 2019-03-01 DIAGNOSIS — E119 Type 2 diabetes mellitus without complications: Secondary | ICD-10-CM | POA: Diagnosis not present

## 2019-03-01 DIAGNOSIS — I10 Essential (primary) hypertension: Secondary | ICD-10-CM | POA: Diagnosis not present

## 2019-03-01 DIAGNOSIS — E7800 Pure hypercholesterolemia, unspecified: Secondary | ICD-10-CM | POA: Diagnosis not present

## 2019-03-01 DIAGNOSIS — G4733 Obstructive sleep apnea (adult) (pediatric): Secondary | ICD-10-CM | POA: Diagnosis not present

## 2019-03-01 DIAGNOSIS — Z0000 Encounter for general adult medical examination without abnormal findings: Secondary | ICD-10-CM | POA: Diagnosis not present

## 2019-03-20 ENCOUNTER — Telehealth: Payer: Self-pay

## 2019-03-20 ENCOUNTER — Telehealth: Payer: Self-pay | Admitting: Cardiovascular Disease

## 2019-03-20 NOTE — Telephone Encounter (Signed)
New Message:    Pt wife wants to know what does he need to do about his appt for tomorrow please?

## 2019-03-20 NOTE — Telephone Encounter (Signed)
Left message on voicemail on mobile phone stating new appt time is 8:15 am on 3/31 in office with Dr. Gwenlyn Found

## 2019-03-20 NOTE — Telephone Encounter (Signed)
   Cardiac Questionnaire:    Since your last visit or hospitalization:    1. Have you been having new or worsening chest pain? No   2. Have you been having new or worsening shortness of breath? No 3. Have you been having new or worsening leg swelling, wt gain, or increase in abdominal girth (pants fitting more tightly)? Yes  Swelling in the legs/ankles    4. Have you had any passing out spells? No    *A YES to any of these questions would result in the appointment being kept. *If all the answers to these questions are NO, we should indicate that given the current situation regarding the worldwide coronarvirus pandemic, at the recommendation of the CDC, we are looking to limit gatherings in our waiting area, and thus will reschedule their appointment beyond four weeks from today.   _____________   EGBTD-17 Pre-Screening Questions:  . Do you currently have a fever? No (yes = cancel and refer to pcp for e-visit) . Have you recently travelled on a cruise, internationally, or to [place], [place], [place], [place], [place], or [place], [place] [employer]) ? No  (yes = cancel, stay home, monitor symptoms, and contact pcp or initiate e-visit if symptoms develop) . Have you been in contact with someone that is currently pending confirmation of Covid19 testing or has been confirmed to have the [place] virus?  No  (yes = cancel, stay home, away from tested individual, monitor symptoms, and contact pcp or initiate e-visit if symptoms develop) . Are you currently experiencing fatigue or cough? No (yes = pt should be prepared to have a mask placed at the time of their visit).

## 2019-03-21 ENCOUNTER — Ambulatory Visit: Payer: Medicare Other | Admitting: Cardiovascular Disease

## 2019-03-21 NOTE — Telephone Encounter (Signed)
Returned call to patient's wife.She stated they were not aware husband's appointment with Dr.Berry time was changed.She was calling to see if he can come at a different time today.Stated his heart rate was slow when he saw PCP 03/01/19.PCP decreased metoprolol to 12.5 mg daily.Advised I will send message to Dr.Berry's RN.

## 2019-03-21 NOTE — Telephone Encounter (Signed)
Please disregard previous message.

## 2019-03-21 NOTE — Telephone Encounter (Signed)
Follow up   Patient's wife states that she was not aware of the appt in the system at 8:15 am for the patient. Please contact the patient to reset this appt.

## 2019-03-21 NOTE — Telephone Encounter (Signed)
New Message   Patient's wife wasn't aware of appointment time change and missed appointment.  I tried to reschedule but patient wants to come in before April 13th would like a nurse to give them a call.

## 2019-03-21 NOTE — Telephone Encounter (Signed)
Follow up      Patient's wife states that she was not aware of the appt in the system at 8:15 am for the patient. Please contact the patient to reset this appt.

## 2019-04-07 ENCOUNTER — Encounter: Payer: Self-pay | Admitting: Cardiovascular Disease

## 2019-04-07 ENCOUNTER — Ambulatory Visit (INDEPENDENT_AMBULATORY_CARE_PROVIDER_SITE_OTHER): Payer: Medicare Other | Admitting: Cardiovascular Disease

## 2019-04-07 ENCOUNTER — Other Ambulatory Visit: Payer: Self-pay

## 2019-04-07 VITALS — BP 120/44 | HR 54 | Ht 68.0 in | Wt 222.0 lb

## 2019-04-07 DIAGNOSIS — I2510 Atherosclerotic heart disease of native coronary artery without angina pectoris: Secondary | ICD-10-CM

## 2019-04-07 DIAGNOSIS — I10 Essential (primary) hypertension: Secondary | ICD-10-CM

## 2019-04-07 DIAGNOSIS — I739 Peripheral vascular disease, unspecified: Secondary | ICD-10-CM

## 2019-04-07 DIAGNOSIS — E785 Hyperlipidemia, unspecified: Secondary | ICD-10-CM

## 2019-04-07 DIAGNOSIS — G4733 Obstructive sleep apnea (adult) (pediatric): Secondary | ICD-10-CM | POA: Diagnosis not present

## 2019-04-07 NOTE — Assessment & Plan Note (Signed)
History of peripheral arterial disease status post remote left iliac stenting by Dr. Glade Lloyd in 2003 with Dopplers recently performed 06/22/2018 revealing normal ABIs and a patent stent.  He denies claudication.

## 2019-04-07 NOTE — Assessment & Plan Note (Signed)
History of dyslipidemia on Zetia and statin therapy with lipid profile performed 02/22/2019 revealing total cholesterol 155, LDL 69 and HDL 23.  Interestingly, his triglyceride level was 498.  I have asked him to have his PCP repeat this to see if this was a true finding.

## 2019-04-07 NOTE — Progress Notes (Signed)
04/07/2019 [name]   07/21/1936  [id number]  Primary Physician Jani Gravel, MD Primary Cardiologist: Lorretta Harp MD [place], [place]  HPI:  Rick Melendez is a 83 y.o.   severely obese, married Caucasian male, father of 2, grandfather to 2 grandchildren who is followed by Dr Gwenlyn Found and Dr Wilson Singer. I last saw him  06/29/2018.  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 He doesn't do any regular exercise but he active around the farm with chores. He has a remote history tobacco abuse, having quit 20 years ago, as well as, treated hypertension and hyperlipidemia, and diabetes.  He has CAD and had left main 2-vessel disease by cath August 31, 2006, and underwent coronary artery bypass grafting x2 by Dr. Ceasar Mons with a LIMA to his LAD, a vein to an obtuse marginal branch and right coronary arter. He has also had stenting of his left leg back in 2003, doppler in July 2013 showed Rt iliac disease but he has been asymptomatic. He denies chest pain or shortness of breath. He does have daytime fatigue and this is unchanged. He has had some leg pain which he attributes to statin Rx. His last Myoview was performed 07/06/13 was nonischemic.[name] He sees Dr. Claiborne Billings for followup and treatment of his sleep apnea and he reports he is compliant with this. Since I saw him back in July last year he is done well.  Denies chest pain or shortness of breath or claudication.  Recent Dopplers performed 06/22/2018 revealed normal ABIs with left iliac stent.  Recent blood work performed by EP 02/22/2019 revealed an excellent fasting lipid profile other than elevated triglycerides of 498 which will need to be repeated.    Current Meds  Medication Sig  . aspirin EC 81 MG tablet Take 81 mg by mouth daily.  . clopidogrel (PLAVIX) 75 MG tablet Take 75 mg by mouth daily.  . Coenzyme Q10 (COQ10) 200 MG  CAPS Take 200 mg by mouth daily.  [name] ezetimibe (ZETIA) 10 MG tablet Take 10 mg by mouth daily.  . furosemide (LASIX) 20 MG tablet Take 1 tablet by mouth daily.  [name] glipiZIDE (GLUCOTROL) 5 MG tablet Take 5 mg by mouth daily.  [name] HYDROcodone-acetaminophen (NORCO/VICODIN) 5-325 MG per tablet Take 1 tablet by mouth 2 (two) times daily.  [name] lisinopril (PRINIVIL,ZESTRIL) 20 MG tablet Take 20 mg by mouth daily.  . metoprolol tartrate (LOPRESSOR) 25 MG tablet Take 12.5 mg by mouth daily.   . Omega-3 Fatty Acids (OMEGA-3 FISH OIL) 1200 MG CAPS Take 2,400 mg by mouth 2 (two) times daily. Take 4 capsules daily   . omeprazole (PRILOSEC) 20 MG capsule Take 1 capsule by mouth daily.  Glory Rosebush VERIO test strip U FOR GLUCOSE TESTING TID  . OVER THE COUNTER MEDICATION USES CPAP NIGHTLY  . rosuvastatin (CRESTOR) 10 MG tablet Take 10 mg by mouth at bedtime.     No Known Allergies  Social History   Socioeconomic History  . Marital status: Married    Spouse name: Not on file  . Number of children: Not on file  . Years of education: Not on file  . Highest education level: Not on file  Occupational History  . Not on file  Social Needs  . Financial resource strain: Not on file  . Food insecurity:  Worry: Not on file    Inability: Not on file  . Transportation needs:    Medical: Not on file    Non-medical: Not on file  Tobacco Use  . Smoking status: Former Smoker    Types: Cigarettes    Last attempt to quit: 09/03/1981    Years since quitting: 37.6  . Smokeless tobacco: Current User    Types: Chew  Substance and Sexual Activity  . Alcohol use: Yes  . Drug use: No  . Sexual activity: Not on file  Lifestyle  . Physical activity:    Days per week: Not on file    Minutes per session: Not on file  . Stress: Not on file  Relationships  . Social connections:    Talks on phone: Not on file    Gets together: Not on file    Attends religious service: Not on file    Active member of club or  organization: Not on file    Attends meetings of clubs or organizations: Not on file    Relationship status: Not on file  . Intimate partner violence:    Fear of current or ex partner: Not on file    Emotionally abused: Not on file    Physically abused: Not on file    Forced sexual activity: Not on file  Other Topics Concern  . Not on file  Social History Narrative  . Not on file     Review of Systems: General: negative for chills, fever, night sweats or weight changes.  Cardiovascular: negative for chest pain, dyspnea on exertion, edema, orthopnea, palpitations, paroxysmal nocturnal dyspnea or shortness of breath Dermatological: negative for rash Respiratory: negative for cough or wheezing Urologic: negative for hematuria Abdominal: negative for nausea, vomiting, diarrhea, bright red blood per rectum, melena, or hematemesis Neurologic: negative for visual changes, syncope, or dizziness All other systems reviewed and are otherwise negative except as noted above.    Blood pressure (!) 120/44, pulse (!) 54, height 5\' 8"  (1.727 m), weight 222 lb (100.7 kg).  General appearance: alert and no distress Neck: no adenopathy, no carotid bruit, no JVD, supple, symmetrical, trachea midline and thyroid not enlarged, symmetric, no tenderness/mass/nodules Lungs: clear to auscultation bilaterally Heart: regular rate and rhythm, S1, S2 normal, no murmur, click, rub or gallop Extremities: extremities normal, atraumatic, no cyanosis or edema Pulses: 2+ and symmetric Skin: Skin color, texture, turgor normal. No rashes or lesions Neurologic: Alert and oriented X 3, normal strength and tone. Normal symmetric reflexes. Normal coordination and gait  EKG sinus bradycardia at 54 without ST or T wave changes.  I personally reviewed this EKG  ASSESSMENT AND PLAN:   CAD - CABG X 2 9/07 LIMA-LAD, SVG-OM. Low risk Myoview 7/12 History of CAD status post CABG times 2 08/31/06 with a LIMA to the LAD and a  vein to the obtuse marginal branch, as well as to the RCA.  His last Myoview stress test performed 07/06/2013 was nonischemic.  He denies chest pain or shortness of breath.  HTN (hypertension) History of essential hypertension with blood pressure today 120/44.  He is on low-dose metoprolol  Dyslipidemia History of dyslipidemia on Zetia and statin therapy with lipid profile performed 02/22/2019 revealing total cholesterol 155, LDL 69 and HDL 23.  Interestingly, his triglyceride level was 498.  I have asked him to have his PCP repeat this to see if this was a true finding.  Sleep apnea- compliant with C-pap History of obstructive sleep apnea on CPAP  PVD (peripheral vascular disease)- Rt iliac disease by doppler 7/13 History of peripheral arterial disease status post remote left iliac stenting by Dr. Glade Lloyd in 2003 with Dopplers recently performed 06/22/2018 revealing normal ABIs and a patent stent.  He denies claudication.      Lorretta Harp MD FACP,FACC,FAHA, [facility] 04/07/2019 11:27 AM

## 2019-04-07 NOTE — Assessment & Plan Note (Signed)
History of essential hypertension with blood pressure today 120/44.  He is on low-dose metoprolol

## 2019-04-07 NOTE — Assessment & Plan Note (Signed)
History of obstructive sleep apnea on CPAP. 

## 2019-04-07 NOTE — Patient Instructions (Signed)
Medication Instructions:  Your physician recommends that you continue on your current medications as directed. Please refer to the Current Medication list given to you today.  If you need a refill on your cardiac medications before your next appointment, please call your pharmacy.   Lab work: NONE If you have labs (blood work) drawn today and your tests are completely normal, you will receive your results only by: [name] MyChart Message (if you have MyChart) OR . A paper copy in the mail If you have any lab test that is abnormal or we need to change your treatment, we will call you to review the results.  Testing/Procedures: Your physician has requested that you have a lower or upper extremity arterial duplex. This test is an ultrasound of the arteries in the legs or arms. It looks at arterial blood flow in the legs and arms. Allow one hour for Lower and Upper Arterial scans. There are no restrictions or special instructions SCHEDULE IN July 2020  Your physician has requested that you have an ankle brachial index (ABI). During this test an ultrasound and blood pressure cuff are used to evaluate the arteries that supply the arms and legs with blood. Allow thirty minutes for this exam. There are no restrictions or special instructions.  SCHEDULE IN July 2020  Follow-Up: At [facility], you and your health needs are our priority.  As part of our continuing mission to provide you with exceptional heart care, we have created designated Provider Care Teams.  These Care Teams include your primary Cardiologist (physician) and Advanced Practice Providers (APPs -  Physician Assistants and Nurse Practitioners) who all work together to provide you with the care you need, when you need it. You will need a follow up appointment in 12 months WITH DR. Gwenlyn Found.  Please call our office 2 months in advance to schedule this appointment.

## 2019-04-07 NOTE — Assessment & Plan Note (Signed)
History of CAD status post CABG times 2 08/31/06 with a LIMA to the LAD and a vein to the obtuse marginal branch, as well as to the RCA.  His last Myoview stress test performed 07/06/2013 was nonischemic.  He denies chest pain or shortness of breath.

## 2019-04-11 NOTE — Addendum Note (Signed)
Addended by: Leland Johns A on: 04/11/2019 04:22 PM   Modules accepted: Orders

## 2019-05-25 DIAGNOSIS — E119 Type 2 diabetes mellitus without complications: Secondary | ICD-10-CM | POA: Diagnosis not present

## 2019-05-25 DIAGNOSIS — I10 Essential (primary) hypertension: Secondary | ICD-10-CM | POA: Diagnosis not present

## 2019-05-25 DIAGNOSIS — Z0000 Encounter for general adult medical examination without abnormal findings: Secondary | ICD-10-CM | POA: Diagnosis not present

## 2019-05-30 ENCOUNTER — Telehealth: Payer: Self-pay | Admitting: Physical Medicine and Rehabilitation

## 2019-05-30 NOTE — Telephone Encounter (Signed)
Ok, we never did RFA did we?

## 2019-05-31 NOTE — Telephone Encounter (Signed)
Is auth needed for bilateral [id number] and [phone]? Scheduled for 6/25.

## 2019-06-01 DIAGNOSIS — E7800 Pure hypercholesterolemia, unspecified: Secondary | ICD-10-CM | POA: Diagnosis not present

## 2019-06-01 DIAGNOSIS — I2510 Atherosclerotic heart disease of native coronary artery without angina pectoris: Secondary | ICD-10-CM | POA: Diagnosis not present

## 2019-06-01 DIAGNOSIS — I10 Essential (primary) hypertension: Secondary | ICD-10-CM | POA: Diagnosis not present

## 2019-06-01 DIAGNOSIS — M47896 Other spondylosis, lumbar region: Secondary | ICD-10-CM | POA: Diagnosis not present

## 2019-06-01 DIAGNOSIS — E119 Type 2 diabetes mellitus without complications: Secondary | ICD-10-CM | POA: Diagnosis not present

## 2019-06-01 NOTE — Telephone Encounter (Signed)
Notification or Prior Authorization is not required for the requested services  This [facility] Advantage members plan does not currently require a prior authorization for [phone] and [phone]

## 2019-06-08 ENCOUNTER — Other Ambulatory Visit: Payer: Self-pay

## 2019-06-08 ENCOUNTER — Ambulatory Visit (INDEPENDENT_AMBULATORY_CARE_PROVIDER_SITE_OTHER): Payer: Medicare Other | Admitting: Orthopedic Surgery

## 2019-06-08 ENCOUNTER — Encounter: Payer: Self-pay | Admitting: Orthopedic Surgery

## 2019-06-08 VITALS — Ht 68.0 in | Wt 222.0 lb

## 2019-06-08 DIAGNOSIS — M170 Bilateral primary osteoarthritis of knee: Secondary | ICD-10-CM

## 2019-06-15 ENCOUNTER — Encounter: Payer: Self-pay | Admitting: Physical Medicine and Rehabilitation

## 2019-06-15 ENCOUNTER — Encounter: Payer: Self-pay | Admitting: Orthopedic Surgery

## 2019-06-15 ENCOUNTER — Ambulatory Visit: Payer: Self-pay

## 2019-06-15 ENCOUNTER — Ambulatory Visit (INDEPENDENT_AMBULATORY_CARE_PROVIDER_SITE_OTHER): Payer: Medicare Other | Admitting: Physical Medicine and Rehabilitation

## 2019-06-15 ENCOUNTER — Other Ambulatory Visit: Payer: Self-pay

## 2019-06-15 VITALS — BP 120/54 | HR 51

## 2019-06-15 DIAGNOSIS — M170 Bilateral primary osteoarthritis of knee: Secondary | ICD-10-CM | POA: Diagnosis not present

## 2019-06-15 DIAGNOSIS — M47816 Spondylosis without myelopathy or radiculopathy, lumbar region: Secondary | ICD-10-CM

## 2019-06-15 DIAGNOSIS — G8929 Other chronic pain: Secondary | ICD-10-CM

## 2019-06-15 MED ORDER — METHYLPREDNISOLONE ACETATE 80 MG/ML IJ SUSP
80.0000 mg | Freq: Once | INTRAMUSCULAR | Status: AC
  Administered 2019-06-15: 80 mg

## 2019-06-15 NOTE — Progress Notes (Signed)
Office Visit Note   Patient: Rick Melendez           Date of Birth: [birth year]           MRN: [id number] Visit Date: 06/08/2019              Requested by: Jani Gravel, [address] [place],  [place] [address] PCP: Jani Gravel, MD  Chief Complaint  Patient presents with  . Right Knee - Follow-up  . Left Knee - Follow-up      HPI: Patient is an 83 year old gentleman with osteoarthritis bilateral knees.  Previous injections have provided him good relief but patient states the pain has returned and has pain with activities of daily living at this time.  Assessment & Plan: Visit Diagnoses:  1. Bilateral primary osteoarthritis of knee     Plan: Both knees were injected he tolerated this well.  Follow-Up Instructions: Return if symptoms worsen or fail to improve.   Ortho Exam  Patient is alert, oriented, no adenopathy, well-dressed, normal affect, normal respiratory effort. Examination patient has an antalgic gait.  There is no effusion no redness no cellulitis in either knee.  There is crepitation with range of motion of both knees and he is tenderness to palpation over the medial and lateral joint lines as well as the patellofemoral joint.  Imaging: Xr C-arm No Report  Result Date: 06/15/2019 Please see Notes tab for imaging impression.  No images are attached to the encounter.  Labs: Lab Results  Component Value Date   REPTSTATUS 03/24/2017 FINAL 03/19/2017   CULT NO GROWTH 5 DAYS 03/19/2017     No results found for: ALBUMIN, PREALBUMIN, LABURIC  Body mass index is 33.75 kg/m.  Orders:  No orders of the defined types were placed in this encounter.  No orders of the defined types were placed in this encounter.    Procedures: Large Joint Inj: bilateral knee on 06/15/2019 4:24 PM Indications: pain and diagnostic evaluation Details: 22 G 1.5 in needle, anteromedial approach  Arthrogram: No  Outcome: tolerated well, no immediate  complications Procedure, treatment alternatives, risks and benefits explained, specific risks discussed. Consent was given by the patient. Immediately prior to procedure a time out was called to verify the correct patient, procedure, equipment, support staff and site/side marked as required. Patient was prepped and draped in the usual sterile fashion.      Clinical Data: No additional findings.  ROS:  All other systems negative, except as noted in the HPI. Review of Systems  Objective: Vital Signs: Ht 5\' 8"  (1.727 m)   Wt 222 lb (100.7 kg)   BMI 33.75 kg/m   Specialty Comments:  No specialty comments available.  PMFS History: Patient Active Problem List   Diagnosis Date Noted  . Influenza B   . AKI (acute kidney injury) ([place])   . CAP (community acquired pneumonia) 03/19/2017  . Bilateral primary osteoarthritis of knee 01/28/2017  . Impingement syndrome of right shoulder 01/28/2017  . CAD - CABG X 2 9/07 LIMA-LAD, SVG-OM. Low risk Myoview 7/12 09/07/2013  . HTN (hypertension) 09/07/2013  . Dyslipidemia 09/07/2013  . Diabetes mellitus ([place]) 09/07/2013  . Chronic renal insufficiency, stage III (moderate)- SCr 1.6 (Aug 2013) 09/07/2013  . Sleep apnea- compliant with C-pap 09/07/2013  . PVD (peripheral vascular disease)- Rt iliac disease by doppler 7/13 09/07/2013   Past Medical History:  Diagnosis Date  . Arthritis   . Coronary artery disease 2007   cabg x3  .  Diabetes mellitus without complication ([place])   . Hyperlipidemia   . Hypertension   . Influenza B 03/19/2017  . OSA on CPAP    Dr Claiborne Billings - follows and treatmentof sleep apnea  . PVD (peripheral vascular disease) ([place]) 2003   DR BERRY-  -LEFT LEG STENTING    Family History  Problem Relation Age of Onset  . Cancer Father   . Cancer Brother     Past Surgical History:  Procedure Laterality Date  . CARDIAC CATHETERIZATION  0911/2007   3 vessel CAD WITH LEFT MAIN CAD SEVERE ,norm lLIMA,LV nom. ,evidence for very  mild aortic stenosis with gradient, mild stenosis distal abdnminal aotra and proximal left common iliac artery 30%  . CORONARY ARTERY BYPASS GRAFT  08/31/2006   DR GERHARDT---LIMA to LAD, VEIN TO AN OBTUSE MARGINAL BRANCH AND  RIGHT CORONARY ARTERY  . CPET  04/13/2012   normal  . DOPPLER ECHOCARDIOGRAPHY  07/08/2011   EF =>55%  . lower arterial doppler  07/01/2012   mildly abnormal lower extremity;ABIs >1 bilaterally with moderately high-grade right iliac stenosis that had not changed from proir study.  [name] NM MYOCAR PERF WALL MOTION  7/182012   EF 59%, normal myocardial perfusio   Social History   Occupational History  . Not on file  Tobacco Use  . Smoking status: Former Smoker    Types: Cigarettes    Quit date: 09/03/1981    Years since quitting: 37.8  . Smokeless tobacco: Current User    Types: Chew  Substance and Sexual Activity  . Alcohol use: Yes  . Drug use: No  . Sexual activity: Not on file

## 2019-06-15 NOTE — Progress Notes (Signed)
Numeric Pain Rating Scale and Functional Assessment Average Pain 7   In the last MONTH (on 0-10 scale) has pain interfered with the following?  1. General activity like being  able to carry out your everyday physical activities such as walking, climbing stairs, carrying groceries, or moving a chair?  Rating(10)    +Driver, -BT (asprin), -Dye Allergies.

## 2019-06-15 NOTE — Procedures (Signed)
Lumbar Facet Joint Intra-Articular Injection(s) with Fluoroscopic Guidance  Patient: Rick Melendez      Date of Birth: July 19, 1936 MRN: [id number] PCP: Jani Gravel, MD      Visit Date: 06/15/2019   Universal Protocol:    Date/Time: 06/15/2019  Consent Given By: the patient  Position: PRONE   Additional Comments: Vital signs were monitored before and after the procedure. Patient was prepped and draped in the usual sterile fashion. The correct patient, procedure, and site was verified.   Injection Procedure Details:  Procedure Site One Meds Administered:  Meds ordered this encounter  Medications  . methylPREDNISolone acetate (DEPO-MEDROL) injection 80 mg     Laterality: Bilateral  Location/Site:  L4-L5 L5-S1  Needle size: 22 guage  Needle type: Spinal  Needle Placement: Articular  Findings:  -Comments: Excellent flow of contrast producing a partial arthrogram.  Procedure Details: The fluoroscope beam is vertically oriented in AP, and the inferior recess is visualized beneath the lower pole of the inferior apophyseal process, which represents the target point for needle insertion. When direct visualization is difficult the target point is located at the medial projection of the vertebral pedicle. The region overlying each aforementioned target is locally anesthetized with a 1 to 2 ml. volume of 1% Lidocaine without Epinephrine.   The spinal needle was inserted into each of the above mentioned facet joints using biplanar fluoroscopic guidance. A 0.25 to 0.5 ml. volume of Isovue-250 was injected and a partial facet joint arthrogram was obtained. A single spot film was obtained of the resulting arthrogram.    One to 1.25 ml of the steroid/anesthetic solution was then injected into each of the facet joints noted above.   Additional Comments:  The patient tolerated the procedure well Dressing: 2 x 2 sterile gauze and Band-Aid    Post-procedure details: Patient was  observed during the procedure. Post-procedure instructions were reviewed.  Patient left the clinic in stable condition.

## 2019-06-15 NOTE — Progress Notes (Signed)
Rick Melendez - 83 y.o. male MRN [id number]  Date of birth: 1936-01-25  Office Visit Note: Visit Date: 06/15/2019 PCP: Jani Gravel, MD Referred by: Jani Gravel, MD  Subjective: Chief Complaint  Patient presents with  . Lower Back - Pain   HPI:  BRAIDAN RICCIARDI is a 83 y.o. male who comes in today For planned repeat bilateral L4-5 and L5-S1 facet joint injections diagnostically and hopefully therapeutically.  He represents an interesting case where he is had pretty severe arthritis at L4-5 and L5-S1 for quite some time.  I have seen him off and on over the years probably about twice a year to maybe 3 times a year and complete facet joint blocks was given quite a bit of relief.  These are able to go intra-articular fairly easily and he does well.  He has no leg pain no focal weakness no new trauma or falls.  He has significant heart history and does take Plavix.  MRI from 2015 shows arthritis at L4-5 and L5-S1.  He has no central stenosis.  I think once again we will repeat the injections diagnostically hopefully therapeutically with fluoroscopic guidance.  The patient has no red flag complaints otherwise.  He continues to stay active otherwise and is basically quarantining at home during the coronavirus.  Plan as always would be to look at radiofrequency ablation if the injections just are not helping very long.  Last injection was in November and he has had worsening symptoms over the last 2 months not relieved with standard conservative care.  ROS Otherwise per HPI.  Assessment & Plan: Visit Diagnoses:  1. Spondylosis without myelopathy or radiculopathy, lumbar region   2. Chronic bilateral low back pain without sciatica     Plan: No additional findings.   Meds & Orders:  Meds ordered this encounter  Medications  . methylPREDNISolone acetate (DEPO-MEDROL) injection 80 mg    Orders Placed This Encounter  Procedures  . Facet Injection  . XR C-ARM NO REPORT    Follow-up: No follow-ups  on file.   Procedures: No procedures performed  Lumbar Facet Joint Intra-Articular Injection(s) with Fluoroscopic Guidance  Patient: EVERRETT LACASSE      Date of Birth: 05-Feb-1936 MRN: [id number] PCP: Jani Gravel, MD      Visit Date: 06/15/2019   Universal Protocol:    Date/Time: 06/15/2019  Consent Given By: the patient  Position: PRONE   Additional Comments: Vital signs were monitored before and after the procedure. Patient was prepped and draped in the usual sterile fashion. The correct patient, procedure, and site was verified.   Injection Procedure Details:  Procedure Site One Meds Administered:  Meds ordered this encounter  Medications  . methylPREDNISolone acetate (DEPO-MEDROL) injection 80 mg     Laterality: Bilateral  Location/Site:  L4-L5 L5-S1  Needle size: 22 guage  Needle type: Spinal  Needle Placement: Articular  Findings:  -Comments: Excellent flow of contrast producing a partial arthrogram.  Procedure Details: The fluoroscope beam is vertically oriented in AP, and the inferior recess is visualized beneath the lower pole of the inferior apophyseal process, which represents the target point for needle insertion. When direct visualization is difficult the target point is located at the medial projection of the vertebral pedicle. The region overlying each aforementioned target is locally anesthetized with a 1 to 2 ml. volume of 1% Lidocaine without Epinephrine.   The spinal needle was inserted into each of the above mentioned facet joints using biplanar fluoroscopic guidance. A  0.25 to 0.5 ml. volume of Isovue-250 was injected and a partial facet joint arthrogram was obtained. A single spot film was obtained of the resulting arthrogram.    One to 1.25 ml of the steroid/anesthetic solution was then injected into each of the facet joints noted above.   Additional Comments:  The patient tolerated the procedure well Dressing: 2 x 2 sterile gauze and  Band-Aid    Post-procedure details: Patient was observed during the procedure. Post-procedure instructions were reviewed.  Patient left the clinic in stable condition.    Clinical History: L4-5: Advanced facet hypertrophy is present. There is uncovering of a broad-based disc herniation. Moderate lateral recess narrowing is worse on the left. Mild to moderate left and moderate right foraminal stenosis is evident.  L5-S1: A leftward disc protrusion is present. Mild facet hypertrophy is noted. This results in mild left lateral recess narrowing. The foramina are patent.  IMPRESSION: 1. The most significant right-sided disease is at L3-4 and L4-5. 2. Moderate right and mild left foraminal stenosis at L3-4 with mild lateral recess narrowing bilaterally. 3. Advanced facet hypertrophy and spurring, and broad-based disc protrusion, and anterolisthesis leads to moderate lateral recess narrowing at L4-5, worse on the left. 4. Mild to moderate left and moderate right foraminal stenosis at L4-5. 5. Mild left lateral recess narrowing at L5-S1. 6. Mild left lateral recess and moderate left foraminal stenosis at T12-L1. 7. Mild lateral recess narrowing at L1-2 is worse on the left.   Electronically Signed   By: Lawrence Santiago M.D.   On: 05/27/2014 17:56     Objective:  VS:  HT:    WT:   BMI:     BP:(!) 120/54  HR:(!) 51bpm  TEMP: ( )  RESP:93 % Physical Exam  Ortho Exam Imaging: Xr C-arm No Report  Result Date: 06/15/2019 Please see Notes tab for imaging impression.

## 2019-06-27 ENCOUNTER — Ambulatory Visit (HOSPITAL_COMMUNITY)
Admission: RE | Admit: 2019-06-27 | Discharge: 2019-06-27 | Disposition: A | Discharge status: Home / Self Care | Payer: Medicare Other | Source: Ambulatory Visit | Attending: Cardiology | Admitting: Cardiology

## 2019-06-27 ENCOUNTER — Ambulatory Visit (HOSPITAL_BASED_OUTPATIENT_CLINIC_OR_DEPARTMENT_OTHER)
Admission: RE | Admit: 2019-06-27 | Discharge: 2019-06-27 | Disposition: A | Discharge status: Home / Self Care | Payer: Medicare Other | Source: Ambulatory Visit | Attending: Cardiovascular Disease | Admitting: Cardiovascular Disease

## 2019-06-27 ENCOUNTER — Other Ambulatory Visit: Payer: Self-pay

## 2019-06-27 ENCOUNTER — Other Ambulatory Visit (HOSPITAL_COMMUNITY): Payer: Self-pay | Admitting: Cardiovascular Disease

## 2019-06-27 DIAGNOSIS — I739 Peripheral vascular disease, unspecified: Secondary | ICD-10-CM

## 2019-08-08 DIAGNOSIS — G4733 Obstructive sleep apnea (adult) (pediatric): Secondary | ICD-10-CM | POA: Diagnosis not present

## 2019-08-09 DIAGNOSIS — D692 Other nonthrombocytopenic purpura: Secondary | ICD-10-CM | POA: Diagnosis not present

## 2019-08-09 DIAGNOSIS — C44722 Squamous cell carcinoma of skin of right lower limb, including hip: Secondary | ICD-10-CM | POA: Diagnosis not present

## 2019-08-09 DIAGNOSIS — D0462 Carcinoma in situ of skin of left upper limb, including shoulder: Secondary | ICD-10-CM | POA: Diagnosis not present

## 2019-08-09 DIAGNOSIS — C44622 Squamous cell carcinoma of skin of right upper limb, including shoulder: Secondary | ICD-10-CM | POA: Diagnosis not present

## 2019-08-09 DIAGNOSIS — L821 Other seborrheic keratosis: Secondary | ICD-10-CM | POA: Diagnosis not present

## 2019-08-09 DIAGNOSIS — L570 Actinic keratosis: Secondary | ICD-10-CM | POA: Diagnosis not present

## 2019-08-09 DIAGNOSIS — Z85828 Personal history of other malignant neoplasm of skin: Secondary | ICD-10-CM | POA: Diagnosis not present

## 2019-08-21 DIAGNOSIS — Z23 Encounter for immunization: Secondary | ICD-10-CM | POA: Diagnosis not present

## 2019-09-22 DIAGNOSIS — Z961 Presence of intraocular lens: Secondary | ICD-10-CM | POA: Diagnosis not present

## 2019-09-22 DIAGNOSIS — E119 Type 2 diabetes mellitus without complications: Secondary | ICD-10-CM | POA: Diagnosis not present

## 2019-09-22 DIAGNOSIS — H524 Presbyopia: Secondary | ICD-10-CM | POA: Diagnosis not present

## 2019-09-22 DIAGNOSIS — H4720 Unspecified optic atrophy: Secondary | ICD-10-CM | POA: Diagnosis not present

## 2019-09-25 DIAGNOSIS — I10 Essential (primary) hypertension: Secondary | ICD-10-CM | POA: Diagnosis not present

## 2019-09-25 DIAGNOSIS — E7800 Pure hypercholesterolemia, unspecified: Secondary | ICD-10-CM | POA: Diagnosis not present

## 2019-09-25 DIAGNOSIS — E119 Type 2 diabetes mellitus without complications: Secondary | ICD-10-CM | POA: Diagnosis not present

## 2019-10-03 DIAGNOSIS — E119 Type 2 diabetes mellitus without complications: Secondary | ICD-10-CM | POA: Diagnosis not present

## 2019-10-03 DIAGNOSIS — I10 Essential (primary) hypertension: Secondary | ICD-10-CM | POA: Diagnosis not present

## 2019-10-03 DIAGNOSIS — I2510 Atherosclerotic heart disease of native coronary artery without angina pectoris: Secondary | ICD-10-CM | POA: Diagnosis not present

## 2019-10-03 DIAGNOSIS — E7800 Pure hypercholesterolemia, unspecified: Secondary | ICD-10-CM | POA: Diagnosis not present

## 2019-10-03 DIAGNOSIS — M47896 Other spondylosis, lumbar region: Secondary | ICD-10-CM | POA: Diagnosis not present

## 2019-10-26 ENCOUNTER — Telehealth: Payer: Self-pay | Admitting: Physical Medicine and Rehabilitation

## 2019-10-26 NOTE — Telephone Encounter (Signed)
ok 

## 2019-10-26 NOTE — Telephone Encounter (Signed)
Scheduled for 11/24 at 1415 with driver.

## 2019-11-14 ENCOUNTER — Encounter: Payer: Self-pay | Admitting: Physical Medicine and Rehabilitation

## 2019-11-14 ENCOUNTER — Ambulatory Visit: Payer: Self-pay

## 2019-11-14 ENCOUNTER — Ambulatory Visit: Payer: Medicare Other | Admitting: Physical Medicine and Rehabilitation

## 2019-11-14 ENCOUNTER — Other Ambulatory Visit: Payer: Self-pay

## 2019-11-14 VITALS — BP 124/68 | HR 64

## 2019-11-14 DIAGNOSIS — M545 Low back pain, unspecified: Secondary | ICD-10-CM

## 2019-11-14 DIAGNOSIS — G8929 Other chronic pain: Secondary | ICD-10-CM | POA: Diagnosis not present

## 2019-11-14 NOTE — Progress Notes (Signed)
 .  Numeric Pain Rating Scale and Functional Assessment Average Pain 5   In the last MONTH (on 0-10 scale) has pain interfered with the following?  1. General activity like being  able to carry out your everyday physical activities such as walking, climbing stairs, carrying groceries, or moving a chair?  Rating(6)   +Driver, +BT(plavix, ok for injection), -Dye Allergies.

## 2020-02-12 DIAGNOSIS — D0462 Carcinoma in situ of skin of left upper limb, including shoulder: Secondary | ICD-10-CM | POA: Diagnosis not present

## 2020-02-12 DIAGNOSIS — Z85828 Personal history of other malignant neoplasm of skin: Secondary | ICD-10-CM | POA: Diagnosis not present

## 2020-02-12 DIAGNOSIS — L821 Other seborrheic keratosis: Secondary | ICD-10-CM | POA: Diagnosis not present

## 2020-02-12 DIAGNOSIS — L570 Actinic keratosis: Secondary | ICD-10-CM | POA: Diagnosis not present

## 2020-02-19 ENCOUNTER — Other Ambulatory Visit: Payer: Self-pay

## 2020-02-19 ENCOUNTER — Encounter: Payer: Self-pay | Admitting: Orthopedic Surgery

## 2020-02-19 ENCOUNTER — Ambulatory Visit: Payer: Medicare Other | Admitting: Physician Assistant

## 2020-02-19 VITALS — Ht 68.0 in | Wt 222.0 lb

## 2020-02-19 DIAGNOSIS — M170 Bilateral primary osteoarthritis of knee: Secondary | ICD-10-CM

## 2020-02-19 MED ORDER — METHYLPREDNISOLONE ACETATE 40 MG/ML IJ SUSP
40.0000 mg | INTRAMUSCULAR | Status: AC | PRN
  Administered 2020-02-19: 40 mg via INTRA_ARTICULAR

## 2020-02-19 MED ORDER — LIDOCAINE HCL 1 % IJ SOLN
1.0000 mL | INTRAMUSCULAR | Status: AC | PRN
  Administered 2020-02-19: 1 mL

## 2020-02-19 MED ORDER — METHYLPREDNISOLONE ACETATE 40 MG/ML IJ SUSP
40.0000 mg | INTRAMUSCULAR | Status: AC | PRN
  Administered 2020-02-19: 11:00:00 40 mg via INTRA_ARTICULAR

## 2020-02-19 NOTE — Progress Notes (Signed)
Office Visit Note   Patient: Rick Melendez           Date of Birth: 1936-09-13           MRN: [id number] Visit Date: 02/19/2020              Requested by: Rick Melendez, [address] [place],  [place] [id number] PCP: Rick Gravel, MD  Chief Complaint  Patient presents with  . Right Knee - Pain    S/p bilateral knee injections 06/08/19  . Left Knee - Pain      HPI: This is a pleasant 84 year old gentleman who is here in follow-up for his bilateral knees.  He has been getting injections that seem to help him for a couple months he is requesting another steroid injection into his knees.  He has had no adverse reactions from these injections  Assessment & Plan: Visit Diagnoses: No diagnosis found.  Plan: Patient may follow-up in 3 months for repeat injections if needed  Follow-Up Instructions: No follow-ups on file.   Ortho Exam  Patient is alert, oriented, no adenopathy, well-dressed, normal affect, normal respiratory effort. Focused examination demonstrates no effusion no erythema in his bilateral knees he does have crepitus with range of motion.  Imaging: No results found. No images are attached to the encounter.  Labs: Lab Results  Component Value Date   REPTSTATUS 03/24/2017 FINAL 03/19/2017   CULT NO GROWTH 5 DAYS 03/19/2017     No results found for: ALBUMIN, PREALBUMIN, LABURIC  Lab Results  Component Value Date   MG 1.5 (L) 03/19/2017   No results found for: VD25OH  No results found for: PREALBUMIN CBC EXTENDED Latest Ref Rng & Units 03/20/2017 03/19/2017  WBC 4.0 - 10.5 K/uL 8.4 10.9(H)  RBC 4.22 - 5.81 MIL/uL 4.90 5.54  HGB 13.0 - 17.0 g/dL 12.4(L) 14.3  HCT 39.0 - 52.0 % 40.3 45.6  PLT 150 - 400 K/uL 146(L) 177  NEUTROABS 1.7 - 7.7 K/uL - 7.5  LYMPHSABS 0.7 - 4.0 K/uL - 1.7     Body mass index is 33.75 kg/m.  Orders:  No orders of the defined types were placed in this encounter.  No orders of the defined types were placed in  this encounter.    Procedures: Large Joint Inj: bilateral knee on 02/19/2020 10:38 AM Indications: pain and diagnostic evaluation Details: 22 G 1.5 in needle, anterolateral approach  Arthrogram: No  Medications (Right): 1 mL lidocaine 1 %; 40 mg methylPREDNISolone acetate 40 MG/ML Medications (Left): 1 mL lidocaine 1 %; 40 mg methylPREDNISolone acetate 40 MG/ML Outcome: tolerated well, no immediate complications Procedure, treatment alternatives, risks and benefits explained, specific risks discussed. Consent was given by the patient. Immediately prior to procedure a time out was called to verify the correct patient, procedure, equipment, support staff and site/side marked as required. Patient was prepped and draped in the usual sterile fashion.      Clinical Data: No additional findings.  ROS:  All other systems negative, except as noted in the HPI. Review of Systems  Objective: Vital Signs: Ht 5\' 8"  (1.727 m)   Wt 222 lb (100.7 kg)   BMI 33.75 kg/m   Specialty Comments:  No specialty comments available.  PMFS History: Patient Active Problem List   Diagnosis Date Noted  . Influenza B   . AKI (acute kidney injury) ([facility])   . CAP (community acquired pneumonia) 03/19/2017  . Bilateral primary osteoarthritis of knee 01/28/2017  . Impingement  syndrome of right shoulder 01/28/2017  . CAD - CABG X 2 9/07 LIMA-LAD, SVG-OM. Low risk Myoview 7/12 09/07/2013  . HTN (hypertension) 09/07/2013  . Dyslipidemia 09/07/2013  . Diabetes mellitus ([place]) 09/07/2013  . Chronic renal insufficiency, stage III (moderate)- SCr 1.6 (Aug 2013) 09/07/2013  . Sleep apnea- compliant with C-pap 09/07/2013  . PVD (peripheral vascular disease)- Rt iliac disease by doppler 7/13 09/07/2013   Past Medical History:  Diagnosis Date  . Arthritis   . Coronary artery disease 2007   cabg x3  . Diabetes mellitus without complication ([place])   . Hyperlipidemia   . Hypertension   . Influenza B 03/19/2017  .  OSA on CPAP    Dr Claiborne Billings - follows and treatmentof sleep apnea  . PVD (peripheral vascular disease) ([place]) 2003   DR BERRY-  -LEFT LEG STENTING    Family History  Problem Relation Age of Onset  . Cancer Father   . Cancer Brother     Past Surgical History:  Procedure Laterality Date  . CARDIAC CATHETERIZATION  0911/2007   3 vessel CAD WITH LEFT MAIN CAD SEVERE ,norm lLIMA,LV nom. ,evidence for very mild aortic stenosis with gradient, mild stenosis distal abdnminal aotra and proximal left common iliac artery 30%  . CORONARY ARTERY BYPASS GRAFT  08/31/2006   DR GERHARDT---LIMA to LAD, VEIN TO AN OBTUSE MARGINAL BRANCH AND  RIGHT CORONARY ARTERY  . CPET  04/13/2012   normal  . DOPPLER ECHOCARDIOGRAPHY  07/08/2011   EF =>55%  . lower arterial doppler  07/01/2012   mildly abnormal lower extremity;ABIs >1 bilaterally with moderately high-grade right iliac stenosis that had not changed from proir study.  [name] NM MYOCAR PERF WALL MOTION  7/182012   EF 59%, normal myocardial perfusio   Social History   Occupational History  . Not on file  Tobacco Use  . Smoking status: Former Smoker    Types: Cigarettes    Quit date: 09/03/1981    Years since quitting: 38.4  . Smokeless tobacco: Current User    Types: Chew  Substance and Sexual Activity  . Alcohol use: Yes  . Drug use: No  . Sexual activity: Not on file

## 2020-02-20 ENCOUNTER — Ambulatory Visit: Payer: Medicare Other | Admitting: Orthopedic Surgery

## 2020-02-29 ENCOUNTER — Telehealth: Payer: Self-pay | Admitting: Physical Medicine and Rehabilitation

## 2020-02-29 DIAGNOSIS — G4733 Obstructive sleep apnea (adult) (pediatric): Secondary | ICD-10-CM | POA: Diagnosis not present

## 2020-02-29 NOTE — Telephone Encounter (Signed)
Needs auth and scheduling for bilateral 2 level RFA.

## 2020-02-29 NOTE — Telephone Encounter (Signed)
RFA pre-Auth

## 2020-03-04 NOTE — Telephone Encounter (Signed)
[id number] and [id number] Injection(s), anesthetic agent and/or more  Notification/Prior Authorization not required if procedure performed in Office; otherwise may be required for this service.  Pt is scheduled for 04/03/20 and 04/11/20 with driver.

## 2020-03-12 DIAGNOSIS — Z1211 Encounter for screening for malignant neoplasm of colon: Secondary | ICD-10-CM | POA: Diagnosis not present

## 2020-03-15 ENCOUNTER — Other Ambulatory Visit: Payer: Self-pay

## 2020-03-15 NOTE — Patient Outreach (Signed)
[facility]) Care Management  03/15/2020  Rick Melendez 02-01-1936 [id number]   Medication Adherence call to [name] Voice message left with a call back number. [name] is showing past due on Lisinopril 20 mg under [place].  [place] [facility] Management Direct Dial [phone]  Fax [phone] [email or web address]

## 2020-03-19 NOTE — Progress Notes (Signed)
RAEDEN PASKINS - 84 y.o. male MRN [id number]  Date of birth: Jun 28, 1936  Office Visit Note: Visit Date: 11/14/2019 PCP: Jani Gravel, MD Referred by: Jani Gravel, MD  Subjective: Chief Complaint  Patient presents with  . Lower Back - Pain  . Right Thigh - Pain  . Left Thigh - Pain   HPI: ASHAI BUNCE is a 84 y.o. male who comes in today For continued evaluation and management of chronic axial back pain.  His history is well-documented through many years of seeing him a couple times a year for facet joint blocks.  He is a gentleman with diabetes and cardiovascular and kidney manifestations.  Has been doing well since I last saw him.  Has axial pain but no radicular complaints.  Some radiating symptoms to the outer thighs.  Worse with standing and ambulating.  No history of central stenosis.  No radicular pain.  He has done extremely well with intermittent facet joint blocks.  Last injection performed was in June 2020 and he had really good relief up until just recently.  No new trauma etc.  I am going to complete diagnostic and for therapeutic facet joint/medial branch blocks of the L4-5 and L5-S1 facet joints.  Again he represents a good candidate potentially for radiofrequency ablation but he just really has not needed that as he gets sometimes 6 months of relief from facet joint block.  ROS Otherwise per HPI.  Assessment & Plan: Visit Diagnoses:  1. Chronic bilateral low back pain without sciatica     Plan: No additional findings.   Meds & Orders: No orders of the defined types were placed in this encounter.   Orders Placed This Encounter  Procedures  . XR C-ARM NO REPORT    Follow-up: No follow-ups on file.   Procedures: Lumbar Diagnostic Facet Joint Nerve Block with Fluoroscopic Guidance   Patient: MAKHARI POSTIGLIONE      Date of Birth: October 27, 1936 MRN: [id number] PCP: Jani Gravel, MD      Visit Date: 11/14/2019   Universal Protocol:    Date/Time: 03/30/216:31 AM  Consent  Given By: the patient  Position: PRONE  Additional Comments: Vital signs were monitored before and after the procedure. Patient was prepped and draped in the usual sterile fashion. The correct patient, procedure, and site was verified.   Injection Procedure Details:  Procedure Site One Meds Administered: No orders of the defined types were placed in this encounter.    Laterality: Bilateral  Location/Site:  L4-L5 L5-S1  Needle size: 22 ga.  Needle type:spinal  Needle Placement: Oblique pedical  Findings:   -Comments: There was excellent flow of contrast along the articular pillars without intravascular flow.  Procedure Details: The fluoroscope beam is vertically oriented in AP and then obliqued 15 to 20 degrees to the ipsilateral side of the desired nerve to achieve the "Scotty dog" appearance.  The skin over the target area of the junction of the superior articulating process and the transverse process (sacral ala if blocking the L5 dorsal rami) was locally anesthetized with a 1 ml volume of 1% Lidocaine without Epinephrine.  The spinal needle was inserted and advanced in a trajectory view down to the target.   After contact with periosteum and negative aspirate for blood and CSF, correct placement without intravascular or epidural spread was confirmed by injecting 0.5 ml. of Isovue-250.  A spot radiograph was obtained of this image.    Next, a 0.5 ml. volume of the injectate described above  was injected. The needle was then redirected to the other facet joint nerves mentioned above if needed.  Prior to the procedure, the patient was given a Pain Diary which was completed for baseline measurements.  After the procedure, the patient rated their pain every 30 minutes and will continue rating at this frequency for a total of 5 hours.  The patient has been asked to complete the Diary and return to [place] by mail, fax or hand delivered as soon as possible.   Additional Comments:  The  patient tolerated the procedure well Dressing: 2 x 2 sterile gauze and Band-Aid    Post-procedure details: Patient was observed during the procedure. Post-procedure instructions were reviewed.  Patient left the clinic in stable condition.   Clinical History: L4-5: Advanced facet hypertrophy is present. There is uncovering of a broad-based disc herniation. Moderate lateral recess narrowing is worse on the left. Mild to moderate left and moderate right foraminal stenosis is evident.  L5-S1: A leftward disc protrusion is present. Mild facet hypertrophy is noted. This results in mild left lateral recess narrowing. The foramina are patent.  IMPRESSION: 1. The most significant right-sided disease is at L3-4 and L4-5. 2. Moderate right and mild left foraminal stenosis at L3-4 with mild lateral recess narrowing bilaterally. 3. Advanced facet hypertrophy and spurring, and broad-based disc protrusion, and anterolisthesis leads to moderate lateral recess narrowing at L4-5, worse on the left. 4. Mild to moderate left and moderate right foraminal stenosis at L4-5. 5. Mild left lateral recess narrowing at L5-S1. 6. Mild left lateral recess and moderate left foraminal stenosis at T12-L1. 7. Mild lateral recess narrowing at L1-2 is worse on the left.   Electronically Signed   By: Lawrence Santiago M.D.   On: 05/27/2014 17:56   He reports that he quit smoking about 38 years ago. His smoking use included cigarettes. His smokeless tobacco use includes chew. No results for input(s): HGBA1C, LABURIC in the last 8760 hours.  Objective:  VS:  HT:    WT:   BMI:     BP:124/68  HR:64bpm  TEMP: ( )  RESP:  Physical Exam Vitals and nursing note reviewed.  Constitutional:      General: He is not in acute distress.    Appearance: Normal appearance. He is well-developed.  HENT:     Head: Normocephalic and atraumatic.  Eyes:     Conjunctiva/sclera: Conjunctivae normal.     Pupils: Pupils  are equal, round, and reactive to light.  Cardiovascular:     Rate and Rhythm: Normal rate.     Pulses: Normal pulses.     Heart sounds: Normal heart sounds.  Pulmonary:     Effort: Pulmonary effort is normal. No respiratory distress.  Musculoskeletal:     Cervical back: Normal range of motion and neck supple. No rigidity.     Right lower leg: No edema.     Left lower leg: No edema.     Comments: Pain below and from sit to stand in full extension and pain with facet loading.  He is very stiff of the lumbar spine.  No paraspinal tightness or taut bands.  No hip pain with rotation good distal strength.  Skin:    General: Skin is warm and dry.     Findings: No erythema or rash.  Neurological:     General: No focal deficit present.     Mental Status: He is alert and oriented to person, place, and time.     Sensory:  No sensory deficit.     Coordination: Coordination normal.     Gait: Gait normal.  Psychiatric:        Mood and Affect: Mood normal.        Behavior: Behavior normal.     Ortho Exam Imaging: No results found.  Past Medical/Family/Surgical/Social History: Medications & Allergies reviewed per EMR, new medications updated. Patient Active Problem List   Diagnosis Date Noted  . Influenza B   . AKI (acute kidney injury) ([place])   . CAP (community acquired pneumonia) 03/19/2017  . Bilateral primary osteoarthritis of knee 01/28/2017  . Impingement syndrome of right shoulder 01/28/2017  . CAD - CABG X 2 9/07 LIMA-LAD, SVG-OM. Low risk Myoview 7/12 09/07/2013  . HTN (hypertension) 09/07/2013  . Dyslipidemia 09/07/2013  . Diabetes mellitus ([place]) 09/07/2013  . Chronic renal insufficiency, stage III (moderate)- SCr 1.6 (Aug 2013) 09/07/2013  . Sleep apnea- compliant with C-pap 09/07/2013  . PVD (peripheral vascular disease)- Rt iliac disease by doppler 7/13 09/07/2013   Past Medical History:  Diagnosis Date  . Arthritis   . Coronary artery disease 2007   cabg x3  .  Diabetes mellitus without complication ([place])   . Hyperlipidemia   . Hypertension   . Influenza B 03/19/2017  . OSA on CPAP    Dr Claiborne Billings - follows and treatmentof sleep apnea  . PVD (peripheral vascular disease) ([place]) 2003   DR BERRY-  -LEFT LEG STENTING   Family History  Problem Relation Age of Onset  . Cancer Father   . Cancer Brother    Past Surgical History:  Procedure Laterality Date  . CARDIAC CATHETERIZATION  0911/2007   3 vessel CAD WITH LEFT MAIN CAD SEVERE ,norm lLIMA,LV nom. ,evidence for very mild aortic stenosis with gradient, mild stenosis distal abdnminal aotra and proximal left common iliac artery 30%  . CORONARY ARTERY BYPASS GRAFT  08/31/2006   DR GERHARDT---LIMA to LAD, VEIN TO AN OBTUSE MARGINAL BRANCH AND  RIGHT CORONARY ARTERY  . CPET  04/13/2012   normal  . DOPPLER ECHOCARDIOGRAPHY  07/08/2011   EF =>55%  . lower arterial doppler  07/01/2012   mildly abnormal lower extremity;ABIs >1 bilaterally with moderately high-grade right iliac stenosis that had not changed from proir study.  [name] NM MYOCAR PERF WALL MOTION  7/182012   EF 59%, normal myocardial perfusio   Social History   Occupational History  . Not on file  Tobacco Use  . Smoking status: Former Smoker    Types: Cigarettes    Quit date: 09/03/1981    Years since quitting: 38.5  . Smokeless tobacco: Current User    Types: Chew  Substance and Sexual Activity  . Alcohol use: Yes  . Drug use: No  . Sexual activity: Not on file

## 2020-04-02 DIAGNOSIS — I10 Essential (primary) hypertension: Secondary | ICD-10-CM | POA: Diagnosis not present

## 2020-04-02 DIAGNOSIS — E119 Type 2 diabetes mellitus without complications: Secondary | ICD-10-CM | POA: Diagnosis not present

## 2020-04-03 ENCOUNTER — Ambulatory Visit: Payer: Medicare Other | Admitting: Physical Medicine and Rehabilitation

## 2020-04-03 ENCOUNTER — Other Ambulatory Visit: Payer: Self-pay

## 2020-04-03 ENCOUNTER — Ambulatory Visit: Payer: Self-pay

## 2020-04-03 ENCOUNTER — Encounter: Payer: Self-pay | Admitting: Physical Medicine and Rehabilitation

## 2020-04-03 VITALS — BP 149/56 | HR 57

## 2020-04-03 DIAGNOSIS — M47816 Spondylosis without myelopathy or radiculopathy, lumbar region: Secondary | ICD-10-CM | POA: Diagnosis not present

## 2020-04-03 MED ORDER — METHYLPREDNISOLONE ACETATE 80 MG/ML IJ SUSP
40.0000 mg | Freq: Once | INTRAMUSCULAR | Status: AC
  Administered 2020-04-03: 40 mg

## 2020-04-03 NOTE — Progress Notes (Signed)
.  Numeric Pain Rating Scale and Functional Assessment Average Pain 8   In the last MONTH (on 0-10 scale) has pain interfered with the following?  1. General activity like being  able to carry out your everyday physical activities such as walking, climbing stairs, carrying groceries, or moving a chair?  Rating(10)   +Driver, +BT(plaviv, ok for RFA), -Dye Allergies.

## 2020-04-05 NOTE — Progress Notes (Signed)
Rick Melendez - 84 y.o. male MRN [id number]  Date of birth: Mar 17, 1936  Office Visit Note: Visit Date: 04/03/2020 PCP: Jani Gravel, MD Referred by: Jani Gravel, MD  Subjective: Chief Complaint  Patient presents with  . Lower Back - Pain   HPI: Rick Melendez is a 84 y.o. male who comes in today For planned right sided radiofrequency ablation of the L4-5 and L5-S1 facet joints.  Patient has had double diagnostic medial branch blocks bilaterally of this region.  These are documented with pain diary.  Prior to that I seen the patient for many years with intermittent intra-articular facet injections with really good relief up until the last year or so with a started to diminish in the time that they would help.  He has 1 of those patients were intra-articular injection did seem to help for quite a while.  His history is complicated by coronary artery disease hypertension peripheral vascular disease as well as morbid obesity.  He does take a small amount of pain medication.  His symptoms are worse with standing better with sitting.  They do limit what he can do from an activity standpoint.  He has had physical therapy he has had home exercise he has had activity modification and this is a chronic long-term condition with him.  He has no radicular leg complaints.  He has no MRI findings of stenosis or other reasons to have back pain per se.  ROS Otherwise per HPI.  Assessment & Plan: Visit Diagnoses:  1. Spondylosis without myelopathy or radiculopathy, lumbar region     Plan: No additional findings.   Meds & Orders:  Meds ordered this encounter  Medications  . methylPREDNISolone acetate (DEPO-MEDROL) injection 40 mg    Orders Placed This Encounter  Procedures  . Radiofrequency,Lumbar  . XR C-ARM NO REPORT    Follow-up: Return if symptoms worsen or fail to improve.   Procedures: No procedures performed  Lumbar Facet Joint Nerve Denervation  Patient: Rick Melendez      Date of Birth:  07/26/1936 MRN: [id number] PCP: Jani Gravel, MD      Visit Date: 04/03/2020   Universal Protocol:    Date/Time: 04/16/216:11 AM  Consent Given By: the patient  Position: PRONE  Additional Comments: Vital signs were monitored before and after the procedure. Patient was prepped and draped in the usual sterile fashion. The correct patient, procedure, and site was verified.   Injection Procedure Details:  Procedure Site One Meds Administered:  Meds ordered this encounter  Medications  . methylPREDNISolone acetate (DEPO-MEDROL) injection 40 mg     Laterality: Right  Location/Site: Right L4 medial branch and L5 dorsal rami  Needle size: 18 G  Needle type: Radiofrequency cannula  Needle Placement: Along juncture of superior articular process and transverse pocess  Findings:  -Comments:  Procedure Details: For each desired target nerve, the corresponding transverse process (sacral ala for the L5 dorsal rami) was identified and the fluoroscope was positioned to square off the endplates of the corresponding vertebral body to achieve a true AP midline view.  The beam was then obliqued 15 to 20 degrees and caudally tilted 15 to 20 degrees to line up a trajectory along the target nerves. The skin over the target of the junction of superior articulating process and transverse process (sacral ala for the L5 dorsal rami) was infiltrated with 67ml of 1% Lidocaine without Epinephrine.  The 18 gauge 39mm active tip outer cannula was advanced in trajectory view to  the target.  This procedure was repeated for each target nerve.  Then, for all levels, the outer cannula placement was fine-tuned and the position was then confirmed with bi-planar imaging.    Test stimulation was done both at sensory and motor levels to ensure there was no radicular stimulation. The target tissues were then infiltrated with 1 ml of 1% Lidocaine without Epinephrine. Subsequently, a percutaneous neurotomy was carried out  for 90 seconds at 80 degrees Celsius.  After the completion of the lesion, 1 ml of injectate was delivered. It was then repeated for each facet joint nerve mentioned above. Appropriate radiographs were obtained to verify the probe placement during the neurotomy.   Additional Comments:  The patient tolerated the procedure well Dressing: 2 x 2 sterile gauze and Band-Aid    Post-procedure details: Patient was observed during the procedure. Post-procedure instructions were reviewed.  Patient left the clinic in stable condition.       Clinical History: L4-5: Advanced facet hypertrophy is present. There is uncovering of a broad-based disc herniation. Moderate lateral recess narrowing is worse on the left. Mild to moderate left and moderate right foraminal stenosis is evident.  L5-S1: A leftward disc protrusion is present. Mild facet hypertrophy is noted. This results in mild left lateral recess narrowing. The foramina are patent.  IMPRESSION: 1. The most significant right-sided disease is at L3-4 and L4-5. 2. Moderate right and mild left foraminal stenosis at L3-4 with mild lateral recess narrowing bilaterally. 3. Advanced facet hypertrophy and spurring, and broad-based disc protrusion, and anterolisthesis leads to moderate lateral recess narrowing at L4-5, worse on the left. 4. Mild to moderate left and moderate right foraminal stenosis at L4-5. 5. Mild left lateral recess narrowing at L5-S1. 6. Mild left lateral recess and moderate left foraminal stenosis at T12-L1. 7. Mild lateral recess narrowing at L1-2 is worse on the left.   Electronically Signed   By: Lawrence Santiago M.D.   On: 05/27/2014 17:56   He reports that he quit smoking about 38 years ago. His smoking use included cigarettes. His smokeless tobacco use includes chew. No results for input(s): HGBA1C, LABURIC in the last 8760 hours.  Objective:  VS:  HT:    WT:   BMI:     BP:(!) 149/56  HR:(!) 57bpm   TEMP: ( )  RESP:  Physical Exam Constitutional:      General: He is not in acute distress.    Appearance: Normal appearance. He is not ill-appearing.  HENT:     Head: Normocephalic and atraumatic.     Right Ear: External ear normal.     Left Ear: External ear normal.  Eyes:     Extraocular Movements: Extraocular movements intact.  Cardiovascular:     Rate and Rhythm: Normal rate.     Pulses: Normal pulses.  Abdominal:     General: There is no distension.     Palpations: Abdomen is soft.  Musculoskeletal:        General: No tenderness or signs of injury.     Right lower leg: No edema.     Left lower leg: No edema.     Comments: Patient has good distal strength without clonus.  He has concordant pain with lumbar extension and facet loading.  Skin:    Findings: No erythema or rash.  Neurological:     General: No focal deficit present.     Mental Status: He is alert and oriented to person, place, and time.  Sensory: No sensory deficit.     Motor: No weakness or abnormal muscle tone.     Coordination: Coordination normal.  Psychiatric:        Mood and Affect: Mood normal.        Behavior: Behavior normal.     Ortho Exam Imaging: No results found.  Past Medical/Family/Surgical/Social History: Medications & Allergies reviewed per EMR, new medications updated. Patient Active Problem List   Diagnosis Date Noted  . Influenza B   . AKI (acute kidney injury) ([place])   . CAP (community acquired pneumonia) 03/19/2017  . Bilateral primary osteoarthritis of knee 01/28/2017  . Impingement syndrome of right shoulder 01/28/2017  . CAD - CABG X 2 9/07 LIMA-LAD, SVG-OM. Low risk Myoview 7/12 09/07/2013  . HTN (hypertension) 09/07/2013  . Dyslipidemia 09/07/2013  . Diabetes mellitus ([place]) 09/07/2013  . Chronic renal insufficiency, stage III (moderate)- SCr 1.6 (Aug 2013) 09/07/2013  . Sleep apnea- compliant with C-pap 09/07/2013  . PVD (peripheral vascular disease)- Rt iliac disease  by doppler 7/13 09/07/2013   Past Medical History:  Diagnosis Date  . Arthritis   . Coronary artery disease 2007   cabg x3  . Diabetes mellitus without complication ([place])   . Hyperlipidemia   . Hypertension   . Influenza B 03/19/2017  . OSA on CPAP    Dr Claiborne Billings - follows and treatmentof sleep apnea  . PVD (peripheral vascular disease) ([place]) 2003   DR BERRY-  -LEFT LEG STENTING   Family History  Problem Relation Age of Onset  . Cancer Father   . Cancer Brother    Past Surgical History:  Procedure Laterality Date  . CARDIAC CATHETERIZATION  0911/2007   3 vessel CAD WITH LEFT MAIN CAD SEVERE ,norm lLIMA,LV nom. ,evidence for very mild aortic stenosis with gradient, mild stenosis distal abdnminal aotra and proximal left common iliac artery 30%  . CORONARY ARTERY BYPASS GRAFT  08/31/2006   DR GERHARDT---LIMA to LAD, VEIN TO AN OBTUSE MARGINAL BRANCH AND  RIGHT CORONARY ARTERY  . CPET  04/13/2012   normal  . DOPPLER ECHOCARDIOGRAPHY  07/08/2011   EF =>55%  . lower arterial doppler  07/01/2012   mildly abnormal lower extremity;ABIs >1 bilaterally with moderately high-grade right iliac stenosis that had not changed from proir study.  [name] NM MYOCAR PERF WALL MOTION  7/182012   EF 59%, normal myocardial perfusio   Social History   Occupational History  . Not on file  Tobacco Use  . Smoking status: Former Smoker    Types: Cigarettes    Quit date: 09/03/1981    Years since quitting: 38.6  . Smokeless tobacco: Current User    Types: Chew  Substance and Sexual Activity  . Alcohol use: Yes  . Drug use: No  . Sexual activity: Not on file

## 2020-04-05 NOTE — Procedures (Signed)
Lumbar Facet Joint Nerve Denervation  Patient: Rick Melendez      Date of Birth: [birth year] MRN: [id number] PCP: Jani Gravel, MD      Visit Date: 04/03/2020   Universal Protocol:    Date/Time: 04/16/216:11 AM  Consent Given By: the patient  Position: PRONE  Additional Comments: Vital signs were monitored before and after the procedure. Patient was prepped and draped in the usual sterile fashion. The correct patient, procedure, and site was verified.   Injection Procedure Details:  Procedure Site One Meds Administered:  Meds ordered this encounter  Medications  . methylPREDNISolone acetate (DEPO-MEDROL) injection 40 mg     Laterality: Right  Location/Site: Right L4 medial branch and L5 dorsal rami  Needle size: 18 G  Needle type: Radiofrequency cannula  Needle Placement: Along juncture of superior articular process and transverse pocess  Findings:  -Comments:  Procedure Details: For each desired target nerve, the corresponding transverse process (sacral ala for the L5 dorsal rami) was identified and the fluoroscope was positioned to square off the endplates of the corresponding vertebral body to achieve a true AP midline view.  The beam was then obliqued 15 to 20 degrees and caudally tilted 15 to 20 degrees to line up a trajectory along the target nerves. The skin over the target of the junction of superior articulating process and transverse process (sacral ala for the L5 dorsal rami) was infiltrated with 46ml of 1% Lidocaine without Epinephrine.  The 18 gauge 70mm active tip outer cannula was advanced in trajectory view to the target.  This procedure was repeated for each target nerve.  Then, for all levels, the outer cannula placement was fine-tuned and the position was then confirmed with bi-planar imaging.    Test stimulation was done both at sensory and motor levels to ensure there was no radicular stimulation. The target tissues were then infiltrated with 1 ml of 1%  Lidocaine without Epinephrine. Subsequently, a percutaneous neurotomy was carried out for 90 seconds at 80 degrees Celsius.  After the completion of the lesion, 1 ml of injectate was delivered. It was then repeated for each facet joint nerve mentioned above. Appropriate radiographs were obtained to verify the probe placement during the neurotomy.   Additional Comments:  The patient tolerated the procedure well Dressing: 2 x 2 sterile gauze and Band-Aid    Post-procedure details: Patient was observed during the procedure. Post-procedure instructions were reviewed.  Patient left the clinic in stable condition.

## 2020-04-10 ENCOUNTER — Other Ambulatory Visit: Payer: Self-pay

## 2020-04-10 ENCOUNTER — Ambulatory Visit: Payer: Medicare Other | Admitting: Cardiovascular Disease

## 2020-04-10 ENCOUNTER — Encounter: Payer: Self-pay | Admitting: Cardiovascular Disease

## 2020-04-10 VITALS — BP 132/58 | HR 48 | Ht 68.0 in | Wt 216.4 lb

## 2020-04-10 DIAGNOSIS — G4733 Obstructive sleep apnea (adult) (pediatric): Secondary | ICD-10-CM

## 2020-04-10 DIAGNOSIS — I739 Peripheral vascular disease, unspecified: Secondary | ICD-10-CM

## 2020-04-10 DIAGNOSIS — I10 Essential (primary) hypertension: Secondary | ICD-10-CM

## 2020-04-10 DIAGNOSIS — I2510 Atherosclerotic heart disease of native coronary artery without angina pectoris: Secondary | ICD-10-CM | POA: Diagnosis not present

## 2020-04-10 DIAGNOSIS — E785 Hyperlipidemia, unspecified: Secondary | ICD-10-CM

## 2020-04-10 NOTE — Assessment & Plan Note (Signed)
History of essential hypertension blood pressure measured today 132/58.  He is on metoprolol and lisinopril.

## 2020-04-10 NOTE — Progress Notes (Signed)
04/10/2020 Rick Melendez   September 07, 1936  [id number]  Primary Physician Jani Gravel, MD Primary Cardiologist: Lorretta Harp MD FACP, [place], [place], [place]  HPI:  Rick Melendez is a 84 y.o.  severely obese, married Caucasian male, father of 2, grandfather to 2 grandchildren who I last saw in the office 04/07/2019. 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 He doesn't do any regular exercise but he active around the farm with chores. He has a remote history tobacco abuse, having quit 20 years ago, as well as, treated hypertension and hyperlipidemia, and diabetes.   He has CAD and had left main 2-vessel disease by cath August 31, 2006, and underwent coronary artery bypass grafting x2 by Dr. Ceasar Mons with a LIMA to his LAD, a vein to an obtuse marginal branch and right coronary arter. He has also had stenting of his left leg back in 2003, doppler in July 2013 showed Rt iliac disease but he has been asymptomatic. He denies chest pain or shortness of breath. He does have daytime fatigue and this is unchanged. He has had some leg pain which he attributes to statin Rx. His last Myoview was performed 07/06/13 was nonischemic.[name] He sees Dr. Claiborne Billings for followup and treatment of his sleep apnea and he reports he is compliant with this.   Since I saw him in the office a year ago he continues to do well.  He denies chest pain or shortness of breath.  He has had his COVID-19 shot as has his wife Rick Melendez.  He had Doppler studies performed 06/27/2019 revealing normal ABIs bilaterally with patent iliac stent.  He denies claudication..   Current Meds  Medication Sig  . aspirin EC 81 MG tablet Take 81 mg by mouth daily.  . clopidogrel (PLAVIX) 75 MG tablet Take 75 mg by mouth daily.  . Coenzyme Q10 (COQ10) 200 MG CAPS Take 200 mg by mouth daily.  [name] ezetimibe (ZETIA) 10 MG tablet Take 10 mg by mouth daily.   . furosemide (LASIX) 20 MG tablet Take 1 tablet by mouth daily.  [name] glipiZIDE (GLUCOTROL) 5 MG tablet Take 5 mg by mouth daily.  [name] HYDROcodone-acetaminophen (NORCO/VICODIN) 5-325 MG per tablet Take 1 tablet by mouth 2 (two) times daily.  [name] lisinopril (PRINIVIL,ZESTRIL) 20 MG tablet Take 20 mg by mouth daily.  . metoprolol tartrate (LOPRESSOR) 25 MG tablet Take 12.5 mg by mouth daily.   . Omega-3 Fatty Acids (OMEGA-3 FISH OIL) 1200 MG CAPS Take 2,400 mg by mouth 2 (two) times daily. Take 4 capsules daily   . omeprazole (PRILOSEC) 20 MG capsule Take 1 capsule by mouth daily.  Glory Rosebush VERIO test strip U FOR GLUCOSE TESTING TID  . OVER THE COUNTER MEDICATION USES CPAP NIGHTLY  . rosuvastatin (CRESTOR) 10 MG tablet Take 10 mg by mouth at bedtime.     No Known Allergies  Social History   Socioeconomic History  . Marital status: Married    Spouse name: Not on file  . Number of children: Not on file  . Years of education: Not on file  . Highest education level: Not on file  Occupational History  . Not on file  Tobacco Use  . Smoking status: Former Smoker    Types: Cigarettes    Quit date: 09/03/1981    Years since quitting: 38.6  . Smokeless tobacco:  Current User    Types: Chew  Substance and Sexual Activity  . Alcohol use: Yes  . Drug use: No  . Sexual activity: Not on file  Other Topics Concern  . Not on file  Social History Narrative  . Not on file   Social Determinants of Health   Financial Resource Strain:   . Difficulty of Paying Living Expenses:   Food Insecurity:   . Worried About Charity fundraiser in the Last Year:   . Arboriculturist in the Last Year:   Transportation Needs:   . Film/video editor (Medical):   [name] Lack of Transportation (Non-Medical):   Physical Activity:   . Days of Exercise per Week:   . Minutes of Exercise per Session:   Stress:   . Feeling of Stress :   Social Connections:   . Frequency of Communication with Friends and Family:     . Frequency of Social Gatherings with Friends and Family:   . Attends Religious Services:   . Active Member of Clubs or Organizations:   . Attends Archivist Meetings:   [name] Marital Status:   Intimate Partner Violence:   . Fear of Current or Ex-Partner:   . Emotionally Abused:   [name] Physically Abused:   . Sexually Abused:      Review of Systems: General: negative for chills, fever, night sweats or weight changes.  Cardiovascular: negative for chest pain, dyspnea on exertion, edema, orthopnea, palpitations, paroxysmal nocturnal dyspnea or shortness of breath Dermatological: negative for rash Respiratory: negative for cough or wheezing Urologic: negative for hematuria Abdominal: negative for nausea, vomiting, diarrhea, bright red blood per rectum, melena, or hematemesis Neurologic: negative for visual changes, syncope, or dizziness All other systems reviewed and are otherwise negative except as noted above.    Blood pressure (!) 132/58, pulse (!) 48, height 5\' 8"  (1.727 m), weight 216 lb 6.4 oz (98.2 kg), SpO2 93 %.  General appearance: alert and no distress Neck: no adenopathy, no carotid bruit, no JVD, supple, symmetrical, trachea midline and thyroid not enlarged, symmetric, no tenderness/mass/nodules Lungs: clear to auscultation bilaterally Heart: regular rate and rhythm, S1, S2 normal, no murmur, click, rub or gallop Extremities: extremities normal, atraumatic, no cyanosis or edema Pulses: 2+ and symmetric Skin: Skin color, texture, turgor normal. No rashes or lesions Neurologic: Alert and oriented X 3, normal strength and tone. Normal symmetric reflexes. Normal coordination and gait  EKG sinus bradycardia at 48 without ST or T wave changes.  I personally reviewed this EKG.  ASSESSMENT AND PLAN:   CAD - CABG X 2 9/07 LIMA-LAD, SVG-OM. Low risk Myoview 7/12 History of CAD status post CABG x2 by Dr. Servando Snare with a LIMA to LAD and vein to obtuse marginal branch after  cardiac catheterization performed 08/31/2016 revealed left main/two-vessel disease.  He had a Myoview performed 07/06/2013 which was nonischemic.  He denies chest pain or shortness of breath.  HTN (hypertension) History of essential hypertension blood pressure measured today 132/58.  He is on metoprolol and lisinopril.  Dyslipidemia History of hyperlipidemia on Zetia and Crestor with lipid profile performed 09/25/2019 revealing 2087, LDL 20 and HDL of 29.  Sleep apnea- compliant with C-pap History of obstructive sleep apnea on CPAP followed by Dr. Claiborne Billings  PVD (peripheral vascular disease)- Rt iliac disease by doppler 7/13 History of prior right iliac stenting 7/13 with Doppler studies performed 06/27/2019 revealing normal ABIs and a patent right iliac stent.  He denies claudication.  Lorretta Harp MD FACP,FACC,FAHA, [facility] 04/10/2020 11:16 AM

## 2020-04-10 NOTE — Assessment & Plan Note (Signed)
History of prior right iliac stenting 7/13 with Doppler studies performed 06/27/2019 revealing normal ABIs and a patent right iliac stent.  He denies claudication.

## 2020-04-10 NOTE — Assessment & Plan Note (Signed)
History of hyperlipidemia on Zetia and Crestor with lipid profile performed 09/25/2019 revealing 2087, LDL 20 and HDL of 29.

## 2020-04-10 NOTE — Assessment & Plan Note (Signed)
History of CAD status post CABG x2 by Dr. Servando Snare with a LIMA to LAD and vein to obtuse marginal branch after cardiac catheterization performed 08/31/2016 revealed left main/two-vessel disease.  He had a Myoview performed 07/06/2013 which was nonischemic.  He denies chest pain or shortness of breath.

## 2020-04-10 NOTE — Assessment & Plan Note (Signed)
History of obstructive sleep apnea on CPAP followed by Dr. Kelly 

## 2020-04-10 NOTE — Patient Instructions (Signed)
Medication Instructions:  Your physician recommends that you continue on your current medications as directed. Please refer to the Current Medication list given to you today.  *If you need a refill on your cardiac medications before your next appointment, please call your pharmacy*  Testing/Procedures: Your physician has requested that you have an ankle brachial index (ABI) in [place]. During this test an ultrasound and blood pressure cuff are used to evaluate the arteries that supply the arms and legs with blood. Allow thirty minutes for this exam. There are no restrictions or special instructions.  Your physician has requested that you have an aorto-iliac duplex in [place]. During this test, an ultrasound is used to evaluate the aorta and iliac arteries. Do not eat after midnight the day before and avoid carbonated beverages  Follow-Up: At [facility], you and your health needs are our priority.  As part of our continuing mission to provide you with exceptional heart care, we have created designated Provider Care Teams.  These Care Teams include your primary Cardiologist (physician) and Advanced Practice Providers (APPs -  Physician Assistants and Nurse Practitioners) who all work together to provide you with the care you need, when you need it.  We recommend signing up for the patient portal called "MyChart".  Sign up information is provided on this After Visit Summary.  MyChart is used to connect with patients for Virtual Visits (Telemedicine).  Patients are able to view lab/test results, encounter notes, upcoming appointments, etc.  Non-urgent messages can be sent to your provider as well.   To learn more about what you can do with MyChart, go to [email or web address].    Your next appointment:   12 month(s)  The format for your next appointment:   In Person  Provider:   Quay Burow, MD

## 2020-04-11 ENCOUNTER — Ambulatory Visit: Payer: Self-pay

## 2020-04-11 ENCOUNTER — Ambulatory Visit: Payer: Medicare Other | Admitting: Physical Medicine and Rehabilitation

## 2020-04-11 ENCOUNTER — Encounter: Payer: Self-pay | Admitting: Physical Medicine and Rehabilitation

## 2020-04-11 VITALS — BP 158/72 | HR 56

## 2020-04-11 DIAGNOSIS — Z0000 Encounter for general adult medical examination without abnormal findings: Secondary | ICD-10-CM | POA: Diagnosis not present

## 2020-04-11 DIAGNOSIS — E7800 Pure hypercholesterolemia, unspecified: Secondary | ICD-10-CM | POA: Diagnosis not present

## 2020-04-11 DIAGNOSIS — M47816 Spondylosis without myelopathy or radiculopathy, lumbar region: Secondary | ICD-10-CM

## 2020-04-11 MED ORDER — METHYLPREDNISOLONE ACETATE 80 MG/ML IJ SUSP
80.0000 mg | Freq: Once | INTRAMUSCULAR | Status: AC
  Administered 2020-04-11: 80 mg

## 2020-04-11 NOTE — Progress Notes (Signed)
 .  Numeric Pain Rating Scale and Functional Assessment Average Pain 6   In the last MONTH (on 0-10 scale) has pain interfered with the following?  1. General activity like being  able to carry out your everyday physical activities such as walking, climbing stairs, carrying groceries, or moving a chair?  Rating(6)   +Driver, -BT, -Dye Allergies.  

## 2020-04-12 NOTE — Progress Notes (Signed)
[name] - 84 y.o. male MRN [id number]  Date of birth: [birth year]  Office Visit Note: Visit Date: 04/11/2020 PCP: Jani Gravel, MD Referred by: Jani Gravel, MD  Subjective: Chief Complaint  Patient presents with  . Lower Back - Pain   HPI:  Rick Melendez is a 84 y.o. male who comes in today For planned left radiofrequency ablation of the L4-5 and L5-S1 facet joints.  Patient recently underwent right sided ablation and has noticed good relief of his right-sided pain.  His history is well-documented.  He has had double diagnostic blocks as well as physical therapy and home exercise and just many years of chronic axial back pain with imaging showing mainly arthritis without stenosis.  He has no radicular complaints or other red flag symptoms.  We are going to complete the left side ablation today.  ROS Otherwise per HPI.  Assessment & Plan: Visit Diagnoses:  1. Spondylosis without myelopathy or radiculopathy, lumbar region     Plan: No additional findings.   Meds & Orders:  Meds ordered this encounter  Medications  . methylPREDNISolone acetate (DEPO-MEDROL) injection 80 mg    Orders Placed This Encounter  Procedures  . Radiofrequency,Lumbar  . XR C-ARM NO REPORT    Follow-up: No follow-ups on file.   Procedures: No procedures performed  Lumbar Facet Joint Nerve Denervation  Patient: [name]      Date of Birth: Oct 07, 1936 MRN: [id number] PCP: Jani Gravel, MD      Visit Date: 04/11/2020   Universal Protocol:    Date/Time: 04/23/216:06 AM  Consent Given By: the patient  Position: PRONE  Additional Comments: Vital signs were monitored before and after the procedure. Patient was prepped and draped in the usual sterile fashion. The correct patient, procedure, and site was verified.   Injection Procedure Details:  Procedure Site One Meds Administered:  Meds ordered this encounter  Medications  . methylPREDNISolone acetate (DEPO-MEDROL) injection 80 mg    Laterality: Left  Location/Site: Left L3 and L4 medial branches and L5 dorsal rami. L4-L5 L5-S1  Needle size: 18 G  Needle type: Radiofrequency cannula  Needle Placement: Along juncture of superior articular process and transverse pocess  Findings:  -Comments: Placing the cannula at the L5 dorsal rami position caused the patient to have some pain during needling through the quadratus lumborum.  Once that was complete he did well and had no other pain complaints during the procedure.  Procedure Details: For each desired target nerve, the corresponding transverse process (sacral ala for the L5 dorsal rami) was identified and the fluoroscope was positioned to square off the endplates of the corresponding vertebral body to achieve a true AP midline view.  The beam was then obliqued 15 to 20 degrees and caudally tilted 15 to 20 degrees to line up a trajectory along the target nerves. The skin over the target of the junction of superior articulating process and transverse process (sacral ala for the L5 dorsal rami) was infiltrated with 15ml of 1% Lidocaine without Epinephrine.  The 18 gauge 28mm active tip outer cannula was advanced in trajectory view to the target.  This procedure was repeated for each target nerve.  Then, for all levels, the outer cannula placement was fine-tuned and the position was then confirmed with bi-planar imaging.    Test stimulation was done both at sensory and motor levels to ensure there was no radicular stimulation. The target tissues were then infiltrated with 1 ml of 1% Lidocaine without Epinephrine.  Subsequently, a percutaneous neurotomy was carried out for 90 seconds at 80 degrees Celsius.  After the completion of the lesion, 1 ml of injectate was delivered. It was then repeated for each facet joint nerve mentioned above. Appropriate radiographs were obtained to verify the probe placement during the neurotomy.   Additional Comments:  The patient tolerated the  procedure well Dressing: 2 x 2 sterile gauze and Band-Aid    Post-procedure details: Patient was observed during the procedure. Post-procedure instructions were reviewed.  Patient left the clinic in stable condition.       Clinical History: L4-5: Advanced facet hypertrophy is present. There is uncovering of a broad-based disc herniation. Moderate lateral recess narrowing is worse on the left. Mild to moderate left and moderate right foraminal stenosis is evident.  L5-S1: A leftward disc protrusion is present. Mild facet hypertrophy is noted. This results in mild left lateral recess narrowing. The foramina are patent.  IMPRESSION: 1. The most significant right-sided disease is at L3-4 and L4-5. 2. Moderate right and mild left foraminal stenosis at L3-4 with mild lateral recess narrowing bilaterally. 3. Advanced facet hypertrophy and spurring, and broad-based disc protrusion, and anterolisthesis leads to moderate lateral recess narrowing at L4-5, worse on the left. 4. Mild to moderate left and moderate right foraminal stenosis at L4-5. 5. Mild left lateral recess narrowing at L5-S1. 6. Mild left lateral recess and moderate left foraminal stenosis at T12-L1. 7. Mild lateral recess narrowing at L1-2 is worse on the left.   Electronically Signed   By: Lawrence Santiago M.D.   On: 05/27/2014 17:56     Objective:  VS:  HT:    WT:   BMI:     BP:(!) 158/72  HR:(!) 56bpm  TEMP: ( )  RESP:  Physical Exam  Ortho Exam Imaging: XR C-ARM NO REPORT  Result Date: 04/11/2020 Please see Notes tab for imaging impression.

## 2020-04-12 NOTE — Procedures (Signed)
Lumbar Facet Joint Nerve Denervation  Patient: Rick Melendez      Date of Birth: [birth year] MRN: [id number] PCP: Jani Gravel, MD      Visit Date: 04/11/2020   Universal Protocol:    Date/Time: 04/23/216:06 AM  Consent Given By: the patient  Position: PRONE  Additional Comments: Vital signs were monitored before and after the procedure. Patient was prepped and draped in the usual sterile fashion. The correct patient, procedure, and site was verified.   Injection Procedure Details:  Procedure Site One Meds Administered:  Meds ordered this encounter  Medications  . methylPREDNISolone acetate (DEPO-MEDROL) injection 80 mg     Laterality: Left  Location/Site: Left L3 and L4 medial branches and L5 dorsal rami. L4-L5 L5-S1  Needle size: 18 G  Needle type: Radiofrequency cannula  Needle Placement: Along juncture of superior articular process and transverse pocess  Findings:  -Comments: Placing the cannula at the L5 dorsal rami position caused the patient to have some pain during needling through the quadratus lumborum.  Once that was complete he did well and had no other pain complaints during the procedure.  Procedure Details: For each desired target nerve, the corresponding transverse process (sacral ala for the L5 dorsal rami) was identified and the fluoroscope was positioned to square off the endplates of the corresponding vertebral body to achieve a true AP midline view.  The beam was then obliqued 15 to 20 degrees and caudally tilted 15 to 20 degrees to line up a trajectory along the target nerves. The skin over the target of the junction of superior articulating process and transverse process (sacral ala for the L5 dorsal rami) was infiltrated with 15ml of 1% Lidocaine without Epinephrine.  The 18 gauge 83mm active tip outer cannula was advanced in trajectory view to the target.  This procedure was repeated for each target nerve.  Then, for all levels, the outer cannula  placement was fine-tuned and the position was then confirmed with bi-planar imaging.    Test stimulation was done both at sensory and motor levels to ensure there was no radicular stimulation. The target tissues were then infiltrated with 1 ml of 1% Lidocaine without Epinephrine. Subsequently, a percutaneous neurotomy was carried out for 90 seconds at 80 degrees Celsius.  After the completion of the lesion, 1 ml of injectate was delivered. It was then repeated for each facet joint nerve mentioned above. Appropriate radiographs were obtained to verify the probe placement during the neurotomy.   Additional Comments:  The patient tolerated the procedure well Dressing: 2 x 2 sterile gauze and Band-Aid    Post-procedure details: Patient was observed during the procedure. Post-procedure instructions were reviewed.  Patient left the clinic in stable condition.

## 2020-06-20 ENCOUNTER — Encounter (HOSPITAL_COMMUNITY): Payer: Medicare Other

## 2020-06-27 ENCOUNTER — Other Ambulatory Visit: Payer: Self-pay

## 2020-06-27 ENCOUNTER — Ambulatory Visit (HOSPITAL_COMMUNITY)
Admission: RE | Admit: 2020-06-27 | Discharge: 2020-06-27 | Disposition: A | Discharge status: Home / Self Care | Payer: Medicare Other | Source: Ambulatory Visit | Attending: Cardiology | Admitting: Cardiology

## 2020-06-27 DIAGNOSIS — I739 Peripheral vascular disease, unspecified: Secondary | ICD-10-CM | POA: Diagnosis not present

## 2020-07-03 ENCOUNTER — Other Ambulatory Visit: Payer: Self-pay

## 2020-07-03 DIAGNOSIS — I739 Peripheral vascular disease, unspecified: Secondary | ICD-10-CM

## 2020-07-26 ENCOUNTER — Telehealth: Payer: Self-pay | Admitting: Physical Medicine and Rehabilitation

## 2020-07-26 DIAGNOSIS — M47816 Spondylosis without myelopathy or radiculopathy, lumbar region: Secondary | ICD-10-CM

## 2020-07-26 DIAGNOSIS — M5136 Other intervertebral disc degeneration, lumbar region: Secondary | ICD-10-CM

## 2020-07-26 NOTE — Telephone Encounter (Signed)
Pt called returning [place] call  [phone]

## 2020-07-26 NOTE — Telephone Encounter (Signed)
Lumbar MRI

## 2020-07-26 NOTE — Telephone Encounter (Signed)
MRI ordered. Left message to notify patient.

## 2020-07-26 NOTE — Telephone Encounter (Signed)
Patient had bilateral L4-5 and L5-S1 RFA in April 2021. Please advise.

## 2020-07-26 NOTE — Telephone Encounter (Signed)
Pt would like to get an appt scheduled for his back   [phone]

## 2020-07-29 NOTE — Telephone Encounter (Signed)
Spoke with patient's wife and advised that MRI has been ordered.

## 2020-08-07 ENCOUNTER — Telehealth: Payer: Self-pay | Admitting: Physical Medicine and Rehabilitation

## 2020-08-07 NOTE — Telephone Encounter (Signed)
Pt called stating she got her husband scheduled for an MRI on 08/22/20 and would like to  Get the review appt scheduled.  [phone]

## 2020-08-08 DIAGNOSIS — G4733 Obstructive sleep apnea (adult) (pediatric): Secondary | ICD-10-CM | POA: Diagnosis not present

## 2020-08-08 NOTE — Telephone Encounter (Signed)
Scheduled for MRI review on 9/8.

## 2020-08-14 DIAGNOSIS — L570 Actinic keratosis: Secondary | ICD-10-CM | POA: Diagnosis not present

## 2020-08-14 DIAGNOSIS — C44329 Squamous cell carcinoma of skin of other parts of face: Secondary | ICD-10-CM | POA: Diagnosis not present

## 2020-08-22 ENCOUNTER — Ambulatory Visit
Admission: RE | Admit: 2020-08-22 | Discharge: 2020-08-22 | Disposition: A | Discharge status: Home / Self Care | Payer: Medicare Other | Source: Ambulatory Visit | Attending: Physical Medicine and Rehabilitation | Admitting: Physical Medicine and Rehabilitation

## 2020-08-22 ENCOUNTER — Other Ambulatory Visit: Payer: Self-pay

## 2020-08-22 DIAGNOSIS — M5136 Other intervertebral disc degeneration, lumbar region: Secondary | ICD-10-CM

## 2020-08-22 DIAGNOSIS — M48061 Spinal stenosis, lumbar region without neurogenic claudication: Secondary | ICD-10-CM | POA: Diagnosis not present

## 2020-08-22 DIAGNOSIS — M47816 Spondylosis without myelopathy or radiculopathy, lumbar region: Secondary | ICD-10-CM

## 2020-08-28 ENCOUNTER — Ambulatory Visit: Payer: Medicare Other | Admitting: Physical Medicine and Rehabilitation

## 2020-08-28 ENCOUNTER — Telehealth: Payer: Self-pay | Admitting: Physical Medicine and Rehabilitation

## 2020-08-28 ENCOUNTER — Encounter: Payer: Self-pay | Admitting: Physical Medicine and Rehabilitation

## 2020-08-28 ENCOUNTER — Other Ambulatory Visit: Payer: Self-pay

## 2020-08-28 VITALS — BP 156/60 | HR 45

## 2020-08-28 DIAGNOSIS — M4316 Spondylolisthesis, lumbar region: Secondary | ICD-10-CM

## 2020-08-28 DIAGNOSIS — M48062 Spinal stenosis, lumbar region with neurogenic claudication: Secondary | ICD-10-CM | POA: Diagnosis not present

## 2020-08-28 DIAGNOSIS — M5116 Intervertebral disc disorders with radiculopathy, lumbar region: Secondary | ICD-10-CM

## 2020-08-28 NOTE — Progress Notes (Signed)
Wife Dr. Hal Neer, [place], Here for MRI review. Low pack pain. Sometimes has pain in right buttock and leg. Sometimes has difficulty with walking.  Numeric Pain Rating Scale and Functional Assessment Average Pain 5   In the last MONTH (on 0-10 scale) has pain interfered with the following?  1. General activity like being  able to carry out your everyday physical activities such as walking, climbing stairs, carrying groceries, or moving a chair?  Rating(3)

## 2020-08-28 NOTE — Telephone Encounter (Signed)
Is auth needed for [phone]? Patient is scheduled for 9/9.

## 2020-08-29 ENCOUNTER — Encounter: Payer: Self-pay | Admitting: Physical Medicine and Rehabilitation

## 2020-08-29 ENCOUNTER — Ambulatory Visit: Payer: Self-pay

## 2020-08-29 ENCOUNTER — Ambulatory Visit: Payer: Medicare Other | Admitting: Physical Medicine and Rehabilitation

## 2020-08-29 VITALS — BP 124/53 | HR 51

## 2020-08-29 DIAGNOSIS — M5116 Intervertebral disc disorders with radiculopathy, lumbar region: Secondary | ICD-10-CM | POA: Diagnosis not present

## 2020-08-29 DIAGNOSIS — M48062 Spinal stenosis, lumbar region with neurogenic claudication: Secondary | ICD-10-CM | POA: Diagnosis not present

## 2020-08-29 MED ORDER — METHYLPREDNISOLONE ACETATE 80 MG/ML IJ SUSP
80.0000 mg | Freq: Once | INTRAMUSCULAR | Status: AC
  Administered 2020-08-29: 80 mg

## 2020-08-29 NOTE — Telephone Encounter (Signed)
Pt was approve.

## 2020-08-29 NOTE — Progress Notes (Signed)
Pt state lower back pain that travels down his poster right leg. Pt state pain cream helps a little. Pt state walking makes the pain worse.  Numeric Pain Rating Scale and Functional Assessment Average Pain 5   In the last MONTH (on 0-10 scale) has pain interfered with the following?  1. General activity like being  able to carry out your everyday physical activities such as walking, climbing stairs, carrying groceries, or moving a chair?  Rating(10)   +Driver, -BT, -Dye Allergies.

## 2020-09-04 NOTE — Procedures (Signed)
Lumbosacral Transforaminal Epidural Steroid Injection - Sub-Pedicular Approach with Fluoroscopic Guidance  Patient: Rick Melendez      Date of Birth: 05-03-1936 MRN: [id number] PCP: Jani Gravel, MD      Visit Date: 08/29/2020   Universal Protocol:    Date/Time: 08/29/2020  Consent Given By: the patient  Position: PRONE  Additional Comments: Vital signs were monitored before and after the procedure. Patient was prepped and draped in the usual sterile fashion. The correct patient, procedure, and site was verified.   Injection Procedure Details:  Procedure Site One Meds Administered:  Meds ordered this encounter  Medications  . methylPREDNISolone acetate (DEPO-MEDROL) injection 80 mg    Laterality: Bilateral  Location/Site:  L4-L5  Needle size: 22 G  Needle type: Spinal  Needle Placement: Transforaminal  Findings:    -Comments: Excellent flow of contrast along the nerve, nerve root and into the epidural space.  Right-sided foraminal injection without any issues at all.  Left-sided injection as we did get close to the foramen he did experience some paresthesia into the leg and foot.  This was the typical quick reaction with the nerve irritation.  We did use numbing medicine and maneuver around this at this point.  He did well after that.  At the time of leaving the office he was still having some continued little bit of pain complaints in the leg.  Procedure Details: After squaring off the end-plates to get a true AP view, the C-arm was positioned so that an oblique view of the foramen as noted above was visualized. The target area is just inferior to the "nose of the scotty dog" or sub pedicular. The soft tissues overlying this structure were infiltrated with 2-3 ml. of 1% Lidocaine without Epinephrine.  The spinal needle was inserted toward the target using a "trajectory" view along the fluoroscope beam.  Under AP and lateral visualization, the needle was advanced so it did  not puncture dura and was located close the 6 O'Clock position of the pedical in AP tracterory. Biplanar projections were used to confirm position. Aspiration was confirmed to be negative for CSF and/or blood. A 1-2 ml. volume of Isovue-250 was injected and flow of contrast was noted at each level. Radiographs were obtained for documentation purposes.   After attaining the desired flow of contrast documented above, a 0.5 to 1.0 ml test dose of 0.25% Marcaine was injected into each respective transforaminal space.  The patient was observed for 90 seconds post injection.  After no sensory deficits were reported, and normal lower extremity motor function was noted,   the above injectate was administered so that equal amounts of the injectate were placed at each foramen (level) into the transforaminal epidural space.   Additional Comments:  No complications occurred Dressing: 2 x 2 sterile gauze and Band-Aid    Post-procedure details: Patient was observed during the procedure. Post-procedure instructions were reviewed.  Patient left the clinic in stable condition.

## 2020-09-04 NOTE — Progress Notes (Signed)
Rick Melendez - 84 y.o. male MRN [id number]  Date of birth: 09-02-1936  Office Visit Note: Visit Date: 08/29/2020 PCP: Jani Gravel, MD Referred by: Jani Gravel, MD  Subjective: Chief Complaint  Patient presents with  . Lower Back - Pain   HPI:  Rick Melendez is a 84 y.o. male who comes in today at the request of Dr. Laurence Spates for planned Bilateral L4-L5 Lumbar epidural steroid injection with fluoroscopic guidance.  The patient has failed conservative care including home exercise, medications, time and activity modification.  This injection will be diagnostic and hopefully therapeutic.  Please see requesting physician notes for further details and justification.   ROS Otherwise per HPI.  Assessment & Plan: Visit Diagnoses:  1. Spinal stenosis of lumbar region with neurogenic claudication   2. Radiculopathy due to lumbar intervertebral disc disorder     Plan: No additional findings.   Meds & Orders:  Meds ordered this encounter  Medications  . methylPREDNISolone acetate (DEPO-MEDROL) injection 80 mg    Orders Placed This Encounter  Procedures  . XR C-ARM NO REPORT  . Epidural Steroid injection    Follow-up: Return if symptoms worsen or fail to improve.   Procedures: No procedures performed  Lumbosacral Transforaminal Epidural Steroid Injection - Sub-Pedicular Approach with Fluoroscopic Guidance  Patient: Rick Melendez      Date of Birth: 09-13-1936 MRN: [id number] PCP: Jani Gravel, MD      Visit Date: 08/29/2020   Universal Protocol:    Date/Time: 08/29/2020  Consent Given By: the patient  Position: PRONE  Additional Comments: Vital signs were monitored before and after the procedure. Patient was prepped and draped in the usual sterile fashion. The correct patient, procedure, and site was verified.   Injection Procedure Details:  Procedure Site One Meds Administered:  Meds ordered this encounter  Medications  . methylPREDNISolone acetate (DEPO-MEDROL)  injection 80 mg    Laterality: Bilateral  Location/Site:  L4-L5  Needle size: 22 G  Needle type: Spinal  Needle Placement: Transforaminal  Findings:    -Comments: Excellent flow of contrast along the nerve, nerve root and into the epidural space.  Right-sided foraminal injection without any issues at all.  Left-sided injection as we did get close to the foramen he did experience some paresthesia into the leg and foot.  This was the typical quick reaction with the nerve irritation.  We did use numbing medicine and maneuver around this at this point.  He did well after that.  At the time of leaving the office he was still having some continued little bit of pain complaints in the leg.  Procedure Details: After squaring off the end-plates to get a true AP view, the C-arm was positioned so that an oblique view of the foramen as noted above was visualized. The target area is just inferior to the "nose of the scotty dog" or sub pedicular. The soft tissues overlying this structure were infiltrated with 2-3 ml. of 1% Lidocaine without Epinephrine.  The spinal needle was inserted toward the target using a "trajectory" view along the fluoroscope beam.  Under AP and lateral visualization, the needle was advanced so it did not puncture dura and was located close the 6 O'Clock position of the pedical in AP tracterory. Biplanar projections were used to confirm position. Aspiration was confirmed to be negative for CSF and/or blood. A 1-2 ml. volume of Isovue-250 was injected and flow of contrast was noted at each level. Radiographs were obtained for documentation  purposes.   After attaining the desired flow of contrast documented above, a 0.5 to 1.0 ml test dose of 0.25% Marcaine was injected into each respective transforaminal space.  The patient was observed for 90 seconds post injection.  After no sensory deficits were reported, and normal lower extremity motor function was noted,   the above injectate  was administered so that equal amounts of the injectate were placed at each foramen (level) into the transforaminal epidural space.   Additional Comments:  No complications occurred Dressing: 2 x 2 sterile gauze and Band-Aid    Post-procedure details: Patient was observed during the procedure. Post-procedure instructions were reviewed.  Patient left the clinic in stable condition.      Clinical History: MRI LUMBAR SPINE WITHOUT CONTRAST  TECHNIQUE: Multiplanar, multisequence MR imaging of the lumbar spine was performed. No intravenous contrast was administered.  COMPARISON:  Lumbar MRI 05/27/2014  FINDINGS: Segmentation:  Normal.  Lowest disc space L5-S1  Alignment: Mild anterolisthesis T12-L1. 4 mm retrolisthesis L1-2. Slight retrolisthesis L3-4. 6 mm anterolisthesis L4-5. Mild retrolisthesis L5-S1.  Vertebrae:  Normal bone marrow.  Negative for fracture or mass.  Conus medullaris and cauda equina: Conus extends to the L1-2 level. Conus and cauda equina appear normal.  Paraspinal and other soft tissues: Negative for mass or adenopathy. No soft tissue edema or fluid collection.  Disc levels:  T12-L1: Advanced disc degeneration left greater than right. Prominent left-sided spurring. Bilateral facet degeneration. Moderate spinal stenosis. Severe subarticular and foraminal stenosis on the left. Mild subarticular stenosis on the right. Mild progression of stenosis on the left since the prior study  L1-2: Advanced disc degeneration with diffuse endplate spurring and marked disc space narrowing. Mild facet degeneration. Mild to moderate spinal stenosis. Moderate subarticular stenosis bilaterally. No interval change  L2-3: Disc degeneration with diffuse disc bulging and endplate spurring. Mild facet degeneration. Mild subarticular stenosis bilaterally with mild progression  L3-4: Disc degeneration with diffuse disc bulging and spurring, right greater  than left. Mild facet degeneration. Mild spinal stenosis. Moderate to severe right subarticular stenosis. Moderate left subarticular stenosis. No significant change  L4-5: 6 mm anterolisthesis with severe facet degeneration and diffuse bulging of the disc. Severe spinal stenosis and severe subarticular stenosis bilaterally have progressed since the prior study. In addition, there is severe right foraminal encroachment with right L4 nerve root impingement and moderate left foraminal encroachment.  L5-S1: Left paracentral disc protrusion with compression of the left S1 nerve root similar to the prior study. Mild facet degeneration bilaterally.  IMPRESSION: Advanced multilevel degenerative change throughout the lumbar spine. There has been progression of degenerative changes stenosis at multiple levels as described above  6 mm anterolisthesis L4-5. Severe spinal and severe subarticular stenosis bilaterally have progressed in the interval. Severe right foraminal encroachment.   Electronically Signed   By: Franchot Gallo M.D.   On: 08/22/2020 21:23     Objective:  VS:  HT:    WT:   BMI:     BP:(!) 124/53  HR:(!) 51bpm  TEMP: ( )  RESP:  Physical Exam Constitutional:      General: He is not in acute distress.    Appearance: Normal appearance. He is not ill-appearing.  HENT:     Head: Normocephalic and atraumatic.     Right Ear: External ear normal.     Left Ear: External ear normal.  Eyes:     Extraocular Movements: Extraocular movements intact.  Cardiovascular:     Rate and Rhythm: Normal rate.  Pulses: Normal pulses.  Abdominal:     General: There is no distension.     Palpations: Abdomen is soft.  Musculoskeletal:        General: No tenderness or signs of injury.     Right lower leg: No edema.     Left lower leg: No edema.     Comments: Patient has good distal strength without clonus.  Skin:    Findings: No erythema or rash.  Neurological:      General: No focal deficit present.     Mental Status: He is alert and oriented to person, place, and time.     Sensory: No sensory deficit.     Motor: No weakness or abnormal muscle tone.     Coordination: Coordination normal.  Psychiatric:        Mood and Affect: Mood normal.        Behavior: Behavior normal.      Imaging: No results found.

## 2020-09-05 DIAGNOSIS — G4733 Obstructive sleep apnea (adult) (pediatric): Secondary | ICD-10-CM | POA: Diagnosis not present

## 2020-09-09 ENCOUNTER — Encounter: Payer: Self-pay | Admitting: Physical Medicine and Rehabilitation

## 2020-09-09 NOTE — Progress Notes (Signed)
CHRYSTOPHER STANGL - 84 y.o. male MRN [id number]  Date of birth: 1936/05/13  Office Visit Note: Visit Date: 08/28/2020 PCP: Jani Gravel, MD Referred by: Jani Gravel, MD  Subjective: Chief Complaint  Patient presents with  . Lower Back - Pain   HPI: TORAN MURCH is a 84 y.o. male who comes in today For evaluation management of chronic worsening severe bilateral low back pain and bilateral buttock pain and sometimes right leg pain. By way of brief review Mr. Mrozek is someone that I have seen over many years and he used to do extremely well with a couple of facet joint blocks yearly for what was looked at as facet arthritis of the lumbar spine. Those diagnostic and therapeutic injections really helped him very well and those notes can be reviewed. Unfortunately over the last year he has gotten progressively worse in his low back and buttock area where the shots just were not helping very long. We had had multiple conversations about radiofrequency ablation and we did complete that and that can be reviewed as well but he still just has not gotten much relief. His pain is a 5 out of 10 it does affect his daily living. Is worse with walking and standing. If he sits he has no pain and it really is limiting what he like to do. He has had no focal weakness or bowel or bladder changes etc. His course is complicated by heart disease and peripheral vascular disease and diabetes. We did obtain MRI of the lumbar spine and this is reviewed with him with spine models and imaging. His wife is present today who provides some of the history. As of interest she has had multiple back surgeries with Dr. Hal Neer and Dr. Trenton Gammon. Mr. Czaja has not had any back surgery.  Review of Systems  Musculoskeletal: Positive for back pain.  All other systems reviewed and are negative.  Otherwise per HPI.  Assessment & Plan: Visit Diagnoses:  1. Spinal stenosis of lumbar region with neurogenic claudication   2. Spondylolisthesis of  lumbar region   3. Radiculopathy due to lumbar intervertebral disc disorder     Plan: Findings:  Exacerbation of chronic back pain now with pain into the buttock sometimes right leg more of a neurogenic claudication type symptomatology that was not amenable to facet joint blocks and radiofrequency ablation. He has had medication management and physical therapy. He continues with some home exercises. His pain is really limiting at this point. MRI obtained shows multilevel severe lumbar stenosis and facet arthropathy. Report reviewed in the note today. We'll complete diagnostic and hopefully therapeutic bilateral L4 transforaminal injection. Patient likely would be a candidate potentially for lumbar decompression. No high-grade listhesis. Depending on his relief they may want to consider evaluation by Dr. Earnie Larsson.    Meds & Orders: No orders of the defined types were placed in this encounter.  No orders of the defined types were placed in this encounter.   Follow-up: Return for Bilateral L4 transforaminal epidural steroid injection..   Procedures: No procedures performed  No notes on file   Clinical History: MRI LUMBAR SPINE WITHOUT CONTRAST  TECHNIQUE: Multiplanar, multisequence MR imaging of the lumbar spine was performed. No intravenous contrast was administered.  COMPARISON:  Lumbar MRI 05/27/2014  FINDINGS: Segmentation:  Normal.  Lowest disc space L5-S1  Alignment: Mild anterolisthesis T12-L1. 4 mm retrolisthesis L1-2. Slight retrolisthesis L3-4. 6 mm anterolisthesis L4-5. Mild retrolisthesis L5-S1.  Vertebrae:  Normal bone marrow.  Negative for  fracture or mass.  Conus medullaris and cauda equina: Conus extends to the L1-2 level. Conus and cauda equina appear normal.  Paraspinal and other soft tissues: Negative for mass or adenopathy. No soft tissue edema or fluid collection.  Disc levels:  T12-L1: Advanced disc degeneration left greater than  right. Prominent left-sided spurring. Bilateral facet degeneration. Moderate spinal stenosis. Severe subarticular and foraminal stenosis on the left. Mild subarticular stenosis on the right. Mild progression of stenosis on the left since the prior study  L1-2: Advanced disc degeneration with diffuse endplate spurring and marked disc space narrowing. Mild facet degeneration. Mild to moderate spinal stenosis. Moderate subarticular stenosis bilaterally. No interval change  L2-3: Disc degeneration with diffuse disc bulging and endplate spurring. Mild facet degeneration. Mild subarticular stenosis bilaterally with mild progression  L3-4: Disc degeneration with diffuse disc bulging and spurring, right greater than left. Mild facet degeneration. Mild spinal stenosis. Moderate to severe right subarticular stenosis. Moderate left subarticular stenosis. No significant change  L4-5: 6 mm anterolisthesis with severe facet degeneration and diffuse bulging of the disc. Severe spinal stenosis and severe subarticular stenosis bilaterally have progressed since the prior study. In addition, there is severe right foraminal encroachment with right L4 nerve root impingement and moderate left foraminal encroachment.  L5-S1: Left paracentral disc protrusion with compression of the left S1 nerve root similar to the prior study. Mild facet degeneration bilaterally.  IMPRESSION: Advanced multilevel degenerative change throughout the lumbar spine. There has been progression of degenerative changes stenosis at multiple levels as described above  6 mm anterolisthesis L4-5. Severe spinal and severe subarticular stenosis bilaterally have progressed in the interval. Severe right foraminal encroachment.   Electronically Signed   By: Franchot Gallo M.D.   On: 08/22/2020 21:23   He reports that he quit smoking about 39 years ago. His smoking use included cigarettes. His smokeless tobacco use  includes chew. No results for input(s): HGBA1C, LABURIC in the last 8760 hours.  Objective:  VS:  HT:    WT:   BMI:     BP:(!) 156/60  HR:(!) 45bpm  TEMP: ( )  RESP:  Physical Exam Vitals and nursing note reviewed.  Constitutional:      General: He is not in acute distress.    Appearance: Normal appearance. He is well-developed. He is obese.  HENT:     Head: Normocephalic and atraumatic.  Eyes:     Conjunctiva/sclera: Conjunctivae normal.     Pupils: Pupils are equal, round, and reactive to light.  Cardiovascular:     Rate and Rhythm: Normal rate.     Pulses: Normal pulses.     Heart sounds: Normal heart sounds.  Pulmonary:     Effort: Pulmonary effort is normal. No respiratory distress.  Musculoskeletal:     Cervical back: Normal range of motion and neck supple. No rigidity.     Right lower leg: No edema.     Left lower leg: No edema.     Comments: Patient slow to stand from a seated position. He has pain with facet loading. He walks with a forward flexed lumbar spine. He has no pain with hip rotation has good distal strength. He has no clonus.  Skin:    General: Skin is warm and dry.     Findings: No erythema or rash.  Neurological:     General: No focal deficit present.     Mental Status: He is alert and oriented to person, place, and time.     Sensory:  No sensory deficit.     Coordination: Coordination normal.     Gait: Gait normal.  Psychiatric:        Mood and Affect: Mood normal.        Behavior: Behavior normal.     Ortho Exam  Imaging: No results found.  Past Medical/Family/Surgical/Social History: Medications & Allergies reviewed per EMR, new medications updated. Patient Active Problem List   Diagnosis Date Noted  . Influenza B   . AKI (acute kidney injury) ([place])   . CAP (community acquired pneumonia) 03/19/2017  . Bilateral primary osteoarthritis of knee 01/28/2017  . Impingement syndrome of right shoulder 01/28/2017  . CAD - CABG X 2 9/07  LIMA-LAD, SVG-OM. Low risk Myoview 7/12 09/07/2013  . HTN (hypertension) 09/07/2013  . Dyslipidemia 09/07/2013  . Diabetes mellitus ([employer]) 09/07/2013  . Chronic renal insufficiency, stage III (moderate)- SCr 1.6 (Aug 2013) 09/07/2013  . Sleep apnea- compliant with C-pap 09/07/2013  . PVD (peripheral vascular disease)- Rt iliac disease by doppler 7/13 09/07/2013   Past Medical History:  Diagnosis Date  . Arthritis   . Coronary artery disease 2007   cabg x3  . Diabetes mellitus without complication ([place])   . Hyperlipidemia   . Hypertension   . Influenza B 03/19/2017  . OSA on CPAP    Dr Claiborne Billings - follows and treatmentof sleep apnea  . PVD (peripheral vascular disease) ([place]) 2003   DR BERRY-  -LEFT LEG STENTING   Family History  Problem Relation Age of Onset  . Cancer Father   . Cancer Brother    Past Surgical History:  Procedure Laterality Date  . CARDIAC CATHETERIZATION  0911/2007   3 vessel CAD WITH LEFT MAIN CAD SEVERE ,norm lLIMA,LV nom. ,evidence for very mild aortic stenosis with gradient, mild stenosis distal abdnminal aotra and proximal left common iliac artery 30%  . CORONARY ARTERY BYPASS GRAFT  08/31/2006   DR GERHARDT---LIMA to LAD, VEIN TO AN OBTUSE MARGINAL BRANCH AND  RIGHT CORONARY ARTERY  . CPET  04/13/2012   normal  . DOPPLER ECHOCARDIOGRAPHY  07/08/2011   EF =>55%  . lower arterial doppler  07/01/2012   mildly abnormal lower extremity;ABIs >1 bilaterally with moderately high-grade right iliac stenosis that had not changed from proir study.  [name] NM MYOCAR PERF WALL MOTION  7/182012   EF 59%, normal myocardial perfusio   Social History   Occupational History  . Not on file  Tobacco Use  . Smoking status: Former Smoker    Types: Cigarettes    Quit date: 09/03/1981    Years since quitting: 39.0  . Smokeless tobacco: Current User    Types: Chew  Substance and Sexual Activity  . Alcohol use: Yes  . Drug use: No  . Sexual activity: Not on file

## 2020-10-03 DIAGNOSIS — Z79891 Long term (current) use of opiate analgesic: Secondary | ICD-10-CM | POA: Diagnosis not present

## 2020-10-03 DIAGNOSIS — I10 Essential (primary) hypertension: Secondary | ICD-10-CM | POA: Diagnosis not present

## 2020-10-03 DIAGNOSIS — E7800 Pure hypercholesterolemia, unspecified: Secondary | ICD-10-CM | POA: Diagnosis not present

## 2020-10-03 DIAGNOSIS — E119 Type 2 diabetes mellitus without complications: Secondary | ICD-10-CM | POA: Diagnosis not present

## 2020-10-11 DIAGNOSIS — I10 Essential (primary) hypertension: Secondary | ICD-10-CM | POA: Diagnosis not present

## 2020-10-11 DIAGNOSIS — E7800 Pure hypercholesterolemia, unspecified: Secondary | ICD-10-CM | POA: Diagnosis not present

## 2020-10-11 DIAGNOSIS — G4733 Obstructive sleep apnea (adult) (pediatric): Secondary | ICD-10-CM | POA: Diagnosis not present

## 2020-10-11 DIAGNOSIS — Z23 Encounter for immunization: Secondary | ICD-10-CM | POA: Diagnosis not present

## 2020-10-11 DIAGNOSIS — I2510 Atherosclerotic heart disease of native coronary artery without angina pectoris: Secondary | ICD-10-CM | POA: Diagnosis not present

## 2020-10-11 DIAGNOSIS — E119 Type 2 diabetes mellitus without complications: Secondary | ICD-10-CM | POA: Diagnosis not present

## 2020-10-18 DIAGNOSIS — Z961 Presence of intraocular lens: Secondary | ICD-10-CM | POA: Diagnosis not present

## 2020-10-18 DIAGNOSIS — E119 Type 2 diabetes mellitus without complications: Secondary | ICD-10-CM | POA: Diagnosis not present

## 2020-10-18 DIAGNOSIS — H524 Presbyopia: Secondary | ICD-10-CM | POA: Diagnosis not present

## 2020-11-05 ENCOUNTER — Ambulatory Visit: Payer: Medicare Other | Admitting: Physician Assistant

## 2020-11-05 ENCOUNTER — Encounter: Payer: Self-pay | Admitting: Orthopedic Surgery

## 2020-11-05 VITALS — Ht 68.0 in | Wt 216.0 lb

## 2020-11-05 DIAGNOSIS — M170 Bilateral primary osteoarthritis of knee: Secondary | ICD-10-CM

## 2020-11-05 MED ORDER — LIDOCAINE HCL 1 % IJ SOLN
5.0000 mL | INTRAMUSCULAR | Status: AC | PRN
  Administered 2020-11-05: 5 mL

## 2020-11-05 MED ORDER — METHYLPREDNISOLONE ACETATE 40 MG/ML IJ SUSP
40.0000 mg | INTRAMUSCULAR | Status: AC | PRN
  Administered 2020-11-05: 40 mg via INTRA_ARTICULAR

## 2020-11-05 NOTE — Progress Notes (Signed)
Office Visit Note   Patient: Rick Melendez           Date of Birth: 1936/03/28           MRN: [id number] Visit Date: 11/05/2020              Requested by: Jani Gravel, [address] [place],  [place] [id number] PCP: Jani Gravel, MD  Chief Complaint  Patient presents with  . Right Knee - Follow-up    S/p cortisone injections 02/19/20 bilat knees   . Left Knee - Follow-up      HPI: And is a 84 year old gentleman with a history of bilateral knee arthritis.  Patient has had steroid injections in the past and it did help him quite a bit.  Last injections were 8 months ago.  Asking for injections once again  Assessment & Plan: Visit Diagnoses: No diagnosis found.  Plan: Patient may follow-up as needed may have injections every 3 months if he needs to  Follow-Up Instructions: No follow-ups on file.   Ortho Exam  Patient is alert, oriented, no adenopathy, well-dressed, normal affect, normal respiratory effort. Bilateral knees no effusion no swelling does have some crepitus with range of motion.    Imaging: No results found. No images are attached to the encounter.  Labs: Lab Results  Component Value Date   REPTSTATUS 03/24/2017 FINAL 03/19/2017   CULT NO GROWTH 5 DAYS 03/19/2017     No results found for: ALBUMIN, PREALBUMIN, LABURIC  Lab Results  Component Value Date   MG 1.5 (L) 03/19/2017   No results found for: VD25OH  No results found for: PREALBUMIN CBC EXTENDED Latest Ref Rng & Units 03/20/2017 03/19/2017  WBC 4.0 - 10.5 K/uL 8.4 10.9(H)  RBC 4.22 - 5.81 MIL/uL 4.90 5.54  HGB 13.0 - 17.0 g/dL 12.4(L) 14.3  HCT 39 - 52 % 40.3 45.6  PLT 150 - 400 K/uL 146(L) 177  NEUTROABS 1.7 - 7.7 K/uL - 7.5  LYMPHSABS 0.7 - 4.0 K/uL - 1.7     Body mass index is 32.84 kg/m.  Orders:  No orders of the defined types were placed in this encounter.  No orders of the defined types were placed in this encounter.    Procedures: Large Joint Inj:  bilateral knee on 11/05/2020 1:08 PM Indications: pain and diagnostic evaluation Details: 22 G 1.5 in needle, anteromedial approach  Arthrogram: No  Medications (Right): 5 mL lidocaine 1 %; 40 mg methylPREDNISolone acetate 40 MG/ML Medications (Left): 5 mL lidocaine 1 %; 40 mg methylPREDNISolone acetate 40 MG/ML Outcome: tolerated well, no immediate complications Procedure, treatment alternatives, risks and benefits explained, specific risks discussed. Consent was given by the patient.      Clinical Data: No additional findings.  ROS:  All other systems negative, except as noted in the HPI. Review of Systems  Objective: Vital Signs: Ht 5\' 8"  (1.727 m)   Wt 216 lb (98 kg)   BMI 32.84 kg/m   Specialty Comments:  No specialty comments available.  PMFS History: Patient Active Problem List   Diagnosis Date Noted  . Influenza B   . AKI (acute kidney injury) ([place])   . CAP (community acquired pneumonia) 03/19/2017  . Bilateral primary osteoarthritis of knee 01/28/2017  . Impingement syndrome of right shoulder 01/28/2017  . CAD - CABG X 2 9/07 LIMA-LAD, SVG-OM. Low risk Myoview 7/12 09/07/2013  . HTN (hypertension) 09/07/2013  . Dyslipidemia 09/07/2013  . Diabetes mellitus ([place]) 09/07/2013  .  Chronic renal insufficiency, stage III (moderate)- SCr 1.6 (Aug 2013) 09/07/2013  . Sleep apnea- compliant with C-pap 09/07/2013  . PVD (peripheral vascular disease)- Rt iliac disease by doppler 7/13 09/07/2013   Past Medical History:  Diagnosis Date  . Arthritis   . Coronary artery disease 2007   cabg x3  . Diabetes mellitus without complication ([place])   . Hyperlipidemia   . Hypertension   . Influenza B 03/19/2017  . OSA on CPAP    Dr Claiborne Billings - follows and treatmentof sleep apnea  . PVD (peripheral vascular disease) ([place]) 2003   DR BERRY-  -LEFT LEG STENTING    Family History  Problem Relation Age of Onset  . Cancer Father   . Cancer Brother     Past Surgical History:    Procedure Laterality Date  . CARDIAC CATHETERIZATION  0911/2007   3 vessel CAD WITH LEFT MAIN CAD SEVERE ,norm lLIMA,LV nom. ,evidence for very mild aortic stenosis with gradient, mild stenosis distal abdnminal aotra and proximal left common iliac artery 30%  . CORONARY ARTERY BYPASS GRAFT  08/31/2006   DR GERHARDT---LIMA to LAD, VEIN TO AN OBTUSE MARGINAL BRANCH AND  RIGHT CORONARY ARTERY  . CPET  04/13/2012   normal  . DOPPLER ECHOCARDIOGRAPHY  07/08/2011   EF =>55%  . lower arterial doppler  07/01/2012   mildly abnormal lower extremity;ABIs >1 bilaterally with moderately high-grade right iliac stenosis that had not changed from proir study.  [name] NM MYOCAR PERF WALL MOTION  7/182012   EF 59%, normal myocardial perfusio   Social History   Occupational History  . Not on file  Tobacco Use  . Smoking status: Former Smoker    Types: Cigarettes    Quit date: 09/03/1981    Years since quitting: 39.2  . Smokeless tobacco: Current User    Types: Chew  Substance and Sexual Activity  . Alcohol use: Yes  . Drug use: No  . Sexual activity: Not on file

## 2020-12-18 ENCOUNTER — Telehealth: Payer: Self-pay | Admitting: Physical Medicine and Rehabilitation

## 2020-12-18 NOTE — Telephone Encounter (Signed)
Pt called stating he had a back injection before and now the pain has returned so he would like to get scheduled for another injection; last one was 08/29/20.  [phone]

## 2020-12-19 NOTE — Telephone Encounter (Signed)
Bilateral L4 TF 08/29/2020. Ok to repeat if helped, same problem/side, and no new injury?

## 2020-12-19 NOTE — Telephone Encounter (Signed)
Yes okay.  He is someone that we used to do facet joint blocks for many years and it turns out he has severe stenosis at that level.  Will probably end up seeing him a few times a year.

## 2020-12-19 NOTE — Telephone Encounter (Signed)
Left message #1

## 2020-12-19 NOTE — Telephone Encounter (Signed)
Is auth needed for Bilateral L4 TF? Scheduled for 1/24.

## 2020-12-19 NOTE — Telephone Encounter (Signed)
Pt called stating they will be available this afternoon if Toni Amend will try them again  [phone]

## 2020-12-23 NOTE — Telephone Encounter (Signed)
Pt not req Auth#. 

## 2021-01-01 DIAGNOSIS — G4733 Obstructive sleep apnea (adult) (pediatric): Secondary | ICD-10-CM | POA: Diagnosis not present

## 2021-01-13 ENCOUNTER — Ambulatory Visit: Payer: Self-pay

## 2021-01-13 ENCOUNTER — Ambulatory Visit: Payer: Medicare Other | Admitting: Physical Medicine and Rehabilitation

## 2021-01-13 ENCOUNTER — Encounter: Payer: Self-pay | Admitting: Physical Medicine and Rehabilitation

## 2021-01-13 ENCOUNTER — Other Ambulatory Visit: Payer: Self-pay

## 2021-01-13 VITALS — BP 131/47 | HR 53

## 2021-01-13 DIAGNOSIS — M48062 Spinal stenosis, lumbar region with neurogenic claudication: Secondary | ICD-10-CM

## 2021-01-13 DIAGNOSIS — M5116 Intervertebral disc disorders with radiculopathy, lumbar region: Secondary | ICD-10-CM | POA: Diagnosis not present

## 2021-01-13 MED ORDER — METHYLPREDNISOLONE ACETATE 80 MG/ML IJ SUSP
80.0000 mg | Freq: Once | INTRAMUSCULAR | Status: AC
  Administered 2021-01-13: 80 mg

## 2021-01-13 MED ORDER — GABAPENTIN 300 MG PO CAPS: ORAL_CAPSULE | ORAL | 1 refills | Status: DC

## 2021-01-13 NOTE — Progress Notes (Signed)
Pt state lower back pain that travels down both legs. Pt state walking and standing makes the pain worse. Pt state he use pain creams to help ease his pain. Pt has hx of inj on 08/29/20 pt state it works but didn't last long.  Numeric Pain Rating Scale and Functional Assessment Average Pain 0   In the last MONTH (on 0-10 scale) has pain interfered with the following?  1. General activity like being  able to carry out your everyday physical activities such as walking, climbing stairs, carrying groceries, or moving a chair?  Rating(9)   +Driver, +BT, -Dye Allergies.

## 2021-01-13 NOTE — Progress Notes (Signed)
Prescription called to pharmacy.

## 2021-01-13 NOTE — Patient Instructions (Signed)

## 2021-02-10 NOTE — Procedures (Signed)
Lumbosacral Transforaminal Epidural Steroid Injection - Sub-Pedicular Approach with Fluoroscopic Guidance  Patient: Rick Melendez      Date of Birth: [birth year] MRN: [id number] PCP: Jani Gravel, MD      Visit Date: 01/13/2021   Universal Protocol:    Date/Time: 01/13/2021  Consent Given By: the patient  Position: PRONE  Additional Comments: Vital signs were monitored before and after the procedure. Patient was prepped and draped in the usual sterile fashion. The correct patient, procedure, and site was verified.   Injection Procedure Details:   Procedure diagnoses: Radiculopathy due to lumbar intervertebral disc disorder [M51.16]    Meds Administered:  Meds ordered this encounter  Medications  . methylPREDNISolone acetate (DEPO-MEDROL) injection 80 mg  . gabapentin (NEURONTIN) 300 MG capsule    Sig: Take 1 capsule by mouth at night for 7 nights and then 1 in the morning and one at night for 7 days and then 3 times a day.    Dispense:  90 capsule    Refill:  1    Laterality: Bilateral  Location/Site:  L4-L5  Needle:5.0 in., 22 ga.  Short bevel or Quincke spinal needle  Needle Placement: Transforaminal  Findings:    -Comments: Excellent flow of contrast along the nerve, nerve root and into the epidural space.  Procedure Details: After squaring off the end-plates to get a true AP view, the C-arm was positioned so that an oblique view of the foramen as noted above was visualized. The target area is just inferior to the "nose of the scotty dog" or sub pedicular. The soft tissues overlying this structure were infiltrated with 2-3 ml. of 1% Lidocaine without Epinephrine.  The spinal needle was inserted toward the target using a "trajectory" view along the fluoroscope beam.  Under AP and lateral visualization, the needle was advanced so it did not puncture dura and was located close the 6 O'Clock position of the pedical in AP tracterory. Biplanar projections were used to  confirm position. Aspiration was confirmed to be negative for CSF and/or blood. A 1-2 ml. volume of Isovue-250 was injected and flow of contrast was noted at each level. Radiographs were obtained for documentation purposes.   After attaining the desired flow of contrast documented above, a 0.5 to 1.0 ml test dose of 0.25% Marcaine was injected into each respective transforaminal space.  The patient was observed for 90 seconds post injection.  After no sensory deficits were reported, and normal lower extremity motor function was noted,   the above injectate was administered so that equal amounts of the injectate were placed at each foramen (level) into the transforaminal epidural space.   Additional Comments:  The patient tolerated the procedure well Dressing: 2 x 2 sterile gauze and Band-Aid    Post-procedure details: Patient was observed during the procedure. Post-procedure instructions were reviewed.  Patient left the clinic in stable condition.

## 2021-02-10 NOTE — Progress Notes (Signed)
Rick Melendez - 85 y.o. male MRN [id number]  Date of birth: [birth year]  Office Visit Note: Visit Date: 01/13/2021 PCP: Jani Gravel, MD Referred by: Jani Gravel, MD  Subjective: Chief Complaint  Patient presents with  . Lower Back - Pain  . Left Leg - Pain  . Right Leg - Pain   HPI:  Rick Melendez is a 85 y.o. male who comes in today For planned repeat bilateral L4 transforaminal epidural steroid injection.  We saw the patient in September and completed bilateral L4 transforaminal injection for severe stenosis at L4-5.  Patient got quite a bit of relief but it did not really last as long as he would like.  He reports that the symptoms really began to come back after about a month but it slowly got worse until now.  His history of this is in the past he did well with bilateral facet joint blocks but over time the stenosis is just gotten worse.  MRI has been updated.  No red flag complaints of focal weakness or bowel bladder changes etc.  Still low back pain with some referral into the hips.  We will repeat the injection today and start gabapentin in a slow tapering up fashion.  This was gone over with him.  We will try to work with him on medications as well as intermittent injections.  Patient not necessarily interested in surgery at his age although should be a consideration.  Could consider spinal cord stimulator trial although this was not talked about.  ROS Otherwise per HPI.  Assessment & Plan: Visit Diagnoses:    ICD-10-CM   1. Radiculopathy due to lumbar intervertebral disc disorder  M51.16 XR C-ARM NO REPORT    Epidural Steroid injection    methylPREDNISolone acetate (DEPO-MEDROL) injection 80 mg  2. Spinal stenosis of lumbar region with neurogenic claudication  M48.062 XR C-ARM NO REPORT    Epidural Steroid injection    methylPREDNISolone acetate (DEPO-MEDROL) injection 80 mg    Plan: No additional findings.   Meds & Orders:  Meds ordered this encounter  Medications  .  methylPREDNISolone acetate (DEPO-MEDROL) injection 80 mg  . gabapentin (NEURONTIN) 300 MG capsule    Sig: Take 1 capsule by mouth at night for 7 nights and then 1 in the morning and one at night for 7 days and then 3 times a day.    Dispense:  90 capsule    Refill:  1    Orders Placed This Encounter  Procedures  . XR C-ARM NO REPORT  . Epidural Steroid injection    Follow-up: Return if symptoms worsen or fail to improve.   Procedures: No procedures performed  Lumbosacral Transforaminal Epidural Steroid Injection - Sub-Pedicular Approach with Fluoroscopic Guidance  Patient: Rick Melendez      Date of Birth: 25-Jul-1936 MRN: [id number] PCP: Jani Gravel, MD      Visit Date: 01/13/2021   Universal Protocol:    Date/Time: 01/13/2021  Consent Given By: the patient  Position: PRONE  Additional Comments: Vital signs were monitored before and after the procedure. Patient was prepped and draped in the usual sterile fashion. The correct patient, procedure, and site was verified.   Injection Procedure Details:   Procedure diagnoses: Radiculopathy due to lumbar intervertebral disc disorder [M51.16]    Meds Administered:  Meds ordered this encounter  Medications  . methylPREDNISolone acetate (DEPO-MEDROL) injection 80 mg  . gabapentin (NEURONTIN) 300 MG capsule    Sig: Take 1 capsule by mouth  at night for 7 nights and then 1 in the morning and one at night for 7 days and then 3 times a day.    Dispense:  90 capsule    Refill:  1    Laterality: Bilateral  Location/Site:  L4-L5  Needle:5.0 in., 22 ga.  Short bevel or Quincke spinal needle  Needle Placement: Transforaminal  Findings:    -Comments: Excellent flow of contrast along the nerve, nerve root and into the epidural space.  Procedure Details: After squaring off the end-plates to get a true AP view, the C-arm was positioned so that an oblique view of the foramen as noted above was visualized. The target area is just  inferior to the "nose of the scotty dog" or sub pedicular. The soft tissues overlying this structure were infiltrated with 2-3 ml. of 1% Lidocaine without Epinephrine.  The spinal needle was inserted toward the target using a "trajectory" view along the fluoroscope beam.  Under AP and lateral visualization, the needle was advanced so it did not puncture dura and was located close the 6 O'Clock position of the pedical in AP tracterory. Biplanar projections were used to confirm position. Aspiration was confirmed to be negative for CSF and/or blood. A 1-2 ml. volume of Isovue-250 was injected and flow of contrast was noted at each level. Radiographs were obtained for documentation purposes.   After attaining the desired flow of contrast documented above, a 0.5 to 1.0 ml test dose of 0.25% Marcaine was injected into each respective transforaminal space.  The patient was observed for 90 seconds post injection.  After no sensory deficits were reported, and normal lower extremity motor function was noted,   the above injectate was administered so that equal amounts of the injectate were placed at each foramen (level) into the transforaminal epidural space.   Additional Comments:  The patient tolerated the procedure well Dressing: 2 x 2 sterile gauze and Band-Aid    Post-procedure details: Patient was observed during the procedure. Post-procedure instructions were reviewed.  Patient left the clinic in stable condition.      Clinical History: MRI LUMBAR SPINE WITHOUT CONTRAST  TECHNIQUE: Multiplanar, multisequence MR imaging of the lumbar spine was performed. No intravenous contrast was administered.  COMPARISON:  Lumbar MRI 05/27/2014  FINDINGS: Segmentation:  Normal.  Lowest disc space L5-S1  Alignment: Mild anterolisthesis T12-L1. 4 mm retrolisthesis L1-2. Slight retrolisthesis L3-4. 6 mm anterolisthesis L4-5. Mild retrolisthesis L5-S1.  Vertebrae:  Normal bone marrow.  Negative  for fracture or mass.  Conus medullaris and cauda equina: Conus extends to the L1-2 level. Conus and cauda equina appear normal.  Paraspinal and other soft tissues: Negative for mass or adenopathy. No soft tissue edema or fluid collection.  Disc levels:  T12-L1: Advanced disc degeneration left greater than right. Prominent left-sided spurring. Bilateral facet degeneration. Moderate spinal stenosis. Severe subarticular and foraminal stenosis on the left. Mild subarticular stenosis on the right. Mild progression of stenosis on the left since the prior study  L1-2: Advanced disc degeneration with diffuse endplate spurring and marked disc space narrowing. Mild facet degeneration. Mild to moderate spinal stenosis. Moderate subarticular stenosis bilaterally. No interval change  L2-3: Disc degeneration with diffuse disc bulging and endplate spurring. Mild facet degeneration. Mild subarticular stenosis bilaterally with mild progression  L3-4: Disc degeneration with diffuse disc bulging and spurring, right greater than left. Mild facet degeneration. Mild spinal stenosis. Moderate to severe right subarticular stenosis. Moderate left subarticular stenosis. No significant change  L4-5: 6 mm anterolisthesis  with severe facet degeneration and diffuse bulging of the disc. Severe spinal stenosis and severe subarticular stenosis bilaterally have progressed since the prior study. In addition, there is severe right foraminal encroachment with right L4 nerve root impingement and moderate left foraminal encroachment.  L5-S1: Left paracentral disc protrusion with compression of the left S1 nerve root similar to the prior study. Mild facet degeneration bilaterally.  IMPRESSION: Advanced multilevel degenerative change throughout the lumbar spine. There has been progression of degenerative changes stenosis at multiple levels as described above  6 mm anterolisthesis L4-5. Severe spinal  and severe subarticular stenosis bilaterally have progressed in the interval. Severe right foraminal encroachment.   Electronically Signed   By: Franchot Gallo M.D.   On: 08/22/2020 21:23     Objective:  VS:  HT:    WT:   BMI:     BP:(!) 131/47  HR:(!) 53bpm  TEMP: ( )  RESP:  Physical Exam Vitals and nursing note reviewed.  Constitutional:      General: He is not in acute distress.    Appearance: Normal appearance. He is obese. He is not ill-appearing.  HENT:     Head: Normocephalic and atraumatic.     Right Ear: External ear normal.     Left Ear: External ear normal.     Nose: No congestion.  Eyes:     Extraocular Movements: Extraocular movements intact.  Cardiovascular:     Rate and Rhythm: Normal rate.     Pulses: Normal pulses.  Pulmonary:     Effort: Pulmonary effort is normal. No respiratory distress.  Abdominal:     General: There is no distension.     Palpations: Abdomen is soft.  Musculoskeletal:        General: No tenderness or signs of injury.     Cervical back: Neck supple.     Right lower leg: No edema.     Left lower leg: No edema.     Comments: Patient has good distal strength without clonus.  Skin:    Findings: No erythema or rash.  Neurological:     General: No focal deficit present.     Mental Status: He is alert and oriented to person, place, and time.     Sensory: No sensory deficit.     Motor: No weakness or abnormal muscle tone.     Coordination: Coordination normal.     Gait: Gait normal.  Psychiatric:        Mood and Affect: Mood normal.        Behavior: Behavior normal.      Imaging: No results found.

## 2021-02-11 ENCOUNTER — Ambulatory Visit: Payer: Self-pay

## 2021-02-11 ENCOUNTER — Ambulatory Visit: Payer: Medicare Other | Admitting: Orthopedic Surgery

## 2021-02-11 DIAGNOSIS — G8929 Other chronic pain: Secondary | ICD-10-CM | POA: Diagnosis not present

## 2021-02-11 DIAGNOSIS — M25562 Pain in left knee: Secondary | ICD-10-CM | POA: Diagnosis not present

## 2021-02-11 DIAGNOSIS — M25561 Pain in right knee: Secondary | ICD-10-CM

## 2021-02-17 DIAGNOSIS — Z85828 Personal history of other malignant neoplasm of skin: Secondary | ICD-10-CM | POA: Diagnosis not present

## 2021-02-17 DIAGNOSIS — L570 Actinic keratosis: Secondary | ICD-10-CM | POA: Diagnosis not present

## 2021-02-17 DIAGNOSIS — D0462 Carcinoma in situ of skin of left upper limb, including shoulder: Secondary | ICD-10-CM | POA: Diagnosis not present

## 2021-02-17 DIAGNOSIS — L821 Other seborrheic keratosis: Secondary | ICD-10-CM | POA: Diagnosis not present

## 2021-02-18 ENCOUNTER — Encounter: Payer: Self-pay | Admitting: Orthopedic Surgery

## 2021-02-18 NOTE — Progress Notes (Signed)
Office Visit Note   Patient: Rick Melendez           Date of Birth: February 22, 1936           MRN: [id number] Visit Date: 02/11/2021              Requested by: Jani Gravel, [address],  [place] [address] PCP: Jani Gravel, MD  Chief Complaint  Patient presents with  . Right Knee - Pain  . Left Knee - Pain      HPI: Patient is an 85 year old gentleman who presents complaining of bilateral knee pain both anteriorly and laterally he states he does have popping with range of motion denies any swelling he states he has decreased range of motion difficulty going up and down steps he has had giving way episodes but no falling.  Patient has had hyaluronic acid injections in the past and is status post CABG surgery.  Assessment & Plan: Visit Diagnoses:  1. Chronic pain of both knees     Plan: Both knees were injected he tolerated this well.  Follow-Up Instructions: No follow-ups on file.   Ortho Exam  Patient is alert, oriented, no adenopathy, well-dressed, normal affect, normal respiratory effort. Examination patient has an antalgic gait with varus alignment of both knees he has crepitation with range of motion collaterals and  cruciates are stable extensor mechanism is intact bilaterally.  Patient has pain to palpation primarily over the medial joint line.  Imaging: No results found. No images are attached to the encounter.  Labs: Lab Results  Component Value Date   REPTSTATUS 03/24/2017 FINAL 03/19/2017   CULT NO GROWTH 5 DAYS 03/19/2017     No results found for: ALBUMIN, PREALBUMIN, LABURIC  Lab Results  Component Value Date   MG 1.5 (L) 03/19/2017   No results found for: VD25OH  No results found for: PREALBUMIN CBC EXTENDED Latest Ref Rng & Units 03/20/2017 03/19/2017  WBC 4.0 - 10.5 K/uL 8.4 10.9(H)  RBC 4.22 - 5.81 MIL/uL 4.90 5.54  HGB 13.0 - 17.0 g/dL 12.4(L) 14.3  HCT 39.0 - 52.0 % 40.3 45.6  PLT 150 - 400 K/uL 146(L) 177  NEUTROABS  1.7 - 7.7 K/uL - 7.5  LYMPHSABS 0.7 - 4.0 K/uL - 1.7     There is no height or weight on file to calculate BMI.  Orders:  Orders Placed This Encounter  Procedures  . XR Knee 1-2 Views Right  . XR Knee 1-2 Views Left   No orders of the defined types were placed in this encounter.    Procedures: Large Joint Inj: bilateral knee on 02/18/2021 11:02 AM Indications: pain and diagnostic evaluation Details: 22 G 1.5 in needle, anteromedial approach  Arthrogram: No  Outcome: tolerated well, no immediate complications Procedure, treatment alternatives, risks and benefits explained, specific risks discussed. Consent was given by the patient. Immediately prior to procedure a time out was called to verify the correct patient, procedure, equipment, support staff and site/side marked as required. Patient was prepped and draped in the usual sterile fashion.      Clinical Data: No additional findings.  ROS:  All other systems negative, except as noted in the HPI. Review of Systems  Objective: Vital Signs: There were no vitals taken for this visit.  Specialty Comments:  No specialty comments available.  PMFS History: Patient Active Problem List   Diagnosis Date Noted  . Influenza B   . AKI (acute kidney injury) ([place])   .  CAP (community acquired pneumonia) 03/19/2017  . Bilateral primary osteoarthritis of knee 01/28/2017  . Impingement syndrome of right shoulder 01/28/2017  . CAD - CABG X 2 9/07 LIMA-LAD, SVG-OM. Low risk Myoview 7/12 09/07/2013  . HTN (hypertension) 09/07/2013  . Dyslipidemia 09/07/2013  . Diabetes mellitus ([place]) 09/07/2013  . Chronic renal insufficiency, stage III (moderate)- SCr 1.6 (Aug 2013) 09/07/2013  . Sleep apnea- compliant with C-pap 09/07/2013  . PVD (peripheral vascular disease)- Rt iliac disease by doppler 7/13 09/07/2013   Past Medical History:  Diagnosis Date  . Arthritis   . Coronary artery disease 2007   cabg x3  . Diabetes mellitus without  complication ([place])   . Hyperlipidemia   . Hypertension   . Influenza B 03/19/2017  . OSA on CPAP    Dr Claiborne Billings - follows and treatmentof sleep apnea  . PVD (peripheral vascular disease) ([place]) 2003   DR BERRY-  -LEFT LEG STENTING    Family History  Problem Relation Age of Onset  . Cancer Father   . Cancer Brother     Past Surgical History:  Procedure Laterality Date  . CARDIAC CATHETERIZATION  0911/2007   3 vessel CAD WITH LEFT MAIN CAD SEVERE ,norm lLIMA,LV nom. ,evidence for very mild aortic stenosis with gradient, mild stenosis distal abdnminal aotra and proximal left common iliac artery 30%  . CORONARY ARTERY BYPASS GRAFT  08/31/2006   DR GERHARDT---LIMA to LAD, VEIN TO AN OBTUSE MARGINAL BRANCH AND  RIGHT CORONARY ARTERY  . CPET  04/13/2012   normal  . DOPPLER ECHOCARDIOGRAPHY  07/08/2011   EF =>55%  . lower arterial doppler  07/01/2012   mildly abnormal lower extremity;ABIs >1 bilaterally with moderately high-grade right iliac stenosis that had not changed from proir study.  [name] NM MYOCAR PERF WALL MOTION  7/182012   EF 59%, normal myocardial perfusio   Social History   Occupational History  . Not on file  Tobacco Use  . Smoking status: Former Smoker    Types: Cigarettes    Quit date: 09/03/1981    Years since quitting: 39.4  . Smokeless tobacco: Current User    Types: Chew  Substance and Sexual Activity  . Alcohol use: Yes  . Drug use: No  . Sexual activity: Not on file

## 2021-03-11 ENCOUNTER — Encounter: Payer: Self-pay | Admitting: Physician Assistant

## 2021-03-11 ENCOUNTER — Ambulatory Visit: Payer: Medicare Other | Admitting: Physician Assistant

## 2021-03-11 DIAGNOSIS — M25561 Pain in right knee: Secondary | ICD-10-CM | POA: Diagnosis not present

## 2021-03-11 DIAGNOSIS — M25562 Pain in left knee: Secondary | ICD-10-CM

## 2021-03-11 DIAGNOSIS — G8929 Other chronic pain: Secondary | ICD-10-CM | POA: Diagnosis not present

## 2021-03-11 NOTE — Progress Notes (Signed)
Office Visit Note   Patient: Rick Melendez           Date of Birth: 02-09-1936           MRN: [id number] Visit Date: 03/11/2021              Requested by: Jani Gravel, [address] [place],  [place] [address] PCP: Jani Gravel, MD  Chief Complaint  Patient presents with  . Left Knee - Follow-up  . Right Knee - Follow-up      HPI: Patient is here in follow-up for his bilateral steroid injection into his knees.  He says he has had significant relief in his symptoms.  Assessment & Plan: Visit Diagnoses: No diagnosis found.  Plan: Discussed with the patient he may have these injections every 3 months or so.  We also discussed use of topical anti-inflammatory such as Voltaren.  Patient currently uses Aspercreme which she thinks helps fairly well  Follow-Up Instructions: No follow-ups on file.   Ortho Exam  Patient is alert, oriented, no adenopathy, well-dressed, normal affect, normal respiratory effort. Bilateral knees varus alignment.  No effusion no swelling minimal tenderness to palpation  Imaging: No results found. No images are attached to the encounter.  Labs: Lab Results  Component Value Date   REPTSTATUS 03/24/2017 FINAL 03/19/2017   CULT NO GROWTH 5 DAYS 03/19/2017     No results found for: ALBUMIN, PREALBUMIN, CBC  Lab Results  Component Value Date   MG 1.5 (L) 03/19/2017   No results found for: VD25OH  No results found for: PREALBUMIN CBC EXTENDED Latest Ref Rng & Units 03/20/2017 03/19/2017  WBC 4.0 - 10.5 K/uL 8.4 10.9(H)  RBC 4.22 - 5.81 MIL/uL 4.90 5.54  HGB 13.0 - 17.0 g/dL 12.4(L) 14.3  HCT 39.0 - 52.0 % 40.3 45.6  PLT 150 - 400 K/uL 146(L) 177  NEUTROABS 1.7 - 7.7 K/uL - 7.5  LYMPHSABS 0.7 - 4.0 K/uL - 1.7     There is no height or weight on file to calculate BMI.  Orders:  No orders of the defined types were placed in this encounter.  No orders of the defined types were placed in this encounter.    Procedures: No  procedures performed  Clinical Data: No additional findings.  ROS:  All other systems negative, except as noted in the HPI. Review of Systems  Objective: Vital Signs: There were no vitals taken for this visit.  Specialty Comments:  No specialty comments available.  PMFS History: Patient Active Problem List   Diagnosis Date Noted  . Influenza B   . AKI (acute kidney injury) ([place])   . CAP (community acquired pneumonia) 03/19/2017  . Bilateral primary osteoarthritis of knee 01/28/2017  . Impingement syndrome of right shoulder 01/28/2017  . CAD - CABG X 2 9/07 LIMA-LAD, SVG-OM. Low risk Myoview 7/12 09/07/2013  . HTN (hypertension) 09/07/2013  . Dyslipidemia 09/07/2013  . Diabetes mellitus ([place]) 09/07/2013  . Chronic renal insufficiency, stage III (moderate)- SCr 1.6 (Aug 2013) 09/07/2013  . Sleep apnea- compliant with C-pap 09/07/2013  . PVD (peripheral vascular disease)- Rt iliac disease by doppler 7/13 09/07/2013   Past Medical History:  Diagnosis Date  . Arthritis   . Coronary artery disease 2007   cabg x3  . Diabetes mellitus without complication ([place])   . Hyperlipidemia   . Hypertension   . Influenza B 03/19/2017  . OSA on CPAP    Dr Claiborne Billings - follows and treatmentof sleep  apnea  . PVD (peripheral vascular disease) ([place]) 2003   DR BERRY-  -LEFT LEG STENTING    Family History  Problem Relation Age of Onset  . Cancer Father   . Cancer Brother     Past Surgical History:  Procedure Laterality Date  . CARDIAC CATHETERIZATION  0911/2007   3 vessel CAD WITH LEFT MAIN CAD SEVERE ,norm lLIMA,LV nom. ,evidence for very mild aortic stenosis with gradient, mild stenosis distal abdnminal aotra and proximal left common iliac artery 30%  . CORONARY ARTERY BYPASS GRAFT  08/31/2006   DR GERHARDT---LIMA to LAD, VEIN TO AN OBTUSE MARGINAL BRANCH AND  RIGHT CORONARY ARTERY  . CPET  04/13/2012   normal  . DOPPLER ECHOCARDIOGRAPHY  07/08/2011   EF =>55%  . lower arterial doppler   07/01/2012   mildly abnormal lower extremity;ABIs >1 bilaterally with moderately high-grade right iliac stenosis that had not changed from proir study.  [name] NM MYOCAR PERF WALL MOTION  7/182012   EF 59%, normal myocardial perfusio   Social History   Occupational History  . Not on file  Tobacco Use  . Smoking status: Former Smoker    Types: Cigarettes    Quit date: 09/03/1981    Years since quitting: 39.5  . Smokeless tobacco: Current User    Types: Chew  Substance and Sexual Activity  . Alcohol use: Yes  . Drug use: No  . Sexual activity: Not on file

## 2021-04-10 DIAGNOSIS — E118 Type 2 diabetes mellitus with unspecified complications: Secondary | ICD-10-CM | POA: Diagnosis not present

## 2021-04-10 DIAGNOSIS — E789 Disorder of lipoprotein metabolism, unspecified: Secondary | ICD-10-CM | POA: Diagnosis not present

## 2021-04-10 DIAGNOSIS — I10 Essential (primary) hypertension: Secondary | ICD-10-CM | POA: Diagnosis not present

## 2021-04-11 ENCOUNTER — Ambulatory Visit: Payer: Medicare Other | Admitting: Cardiovascular Disease

## 2021-04-11 ENCOUNTER — Encounter: Payer: Self-pay | Admitting: Cardiovascular Disease

## 2021-04-11 ENCOUNTER — Telehealth: Payer: Self-pay | Admitting: *Deleted

## 2021-04-11 ENCOUNTER — Other Ambulatory Visit: Payer: Self-pay

## 2021-04-11 VITALS — BP 132/52 | HR 48 | Ht 68.0 in | Wt 217.4 lb

## 2021-04-11 DIAGNOSIS — I739 Peripheral vascular disease, unspecified: Secondary | ICD-10-CM | POA: Diagnosis not present

## 2021-04-11 DIAGNOSIS — E785 Hyperlipidemia, unspecified: Secondary | ICD-10-CM | POA: Diagnosis not present

## 2021-04-11 DIAGNOSIS — I10 Essential (primary) hypertension: Secondary | ICD-10-CM

## 2021-04-11 DIAGNOSIS — G4733 Obstructive sleep apnea (adult) (pediatric): Secondary | ICD-10-CM

## 2021-04-11 DIAGNOSIS — I2510 Atherosclerotic heart disease of native coronary artery without angina pectoris: Secondary | ICD-10-CM | POA: Diagnosis not present

## 2021-04-11 NOTE — Assessment & Plan Note (Signed)
History of essential hypertension a blood pressure measured today 132/52.  He is on lisinopril and metoprolol.

## 2021-04-11 NOTE — Telephone Encounter (Signed)
Patient was seen in the office today by Dr Gwenlyn Found and requested to speak with me about his CPAP machine. Patient was brought to my office by the nurse and we discussed why he has not used his CPAP machine since March. He states that when he turns it on there is a smell of "something burning." I asked him if he has water in the chamber. He states that he does, however it will be gone in the mornings. He requested to be switched back to [facility]. Order to manage patient's CPAP and supplies sent to [facility]. They will contact patient to come in to get his machine checked out.

## 2021-04-11 NOTE — Assessment & Plan Note (Signed)
History of dyslipidemia on Crestor and Zetia with lipid profile performed 10/03/2020 revealing total cholesterol of 90, LDL 35 and HDL 27.

## 2021-04-11 NOTE — Patient Instructions (Signed)

## 2021-04-11 NOTE — Progress Notes (Signed)
04/11/2021 Rick Melendez   [birth year]  [id number]  Primary Physician Rick Gravel, MD Primary Cardiologist: Rick Harp MD FACP, [place], [place], [place]  HPI:  Rick Melendez is a 85 y.o. severely obese, married Caucasian male, father of 2, grandfather to 2 grandchildren who I last saw in the office 04/10/2020. 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 He doesn't do any regular exercise but he active around the farm with chores. He has a remote history tobacco abuse, having quit 20 years ago, as well as, treated hypertension and hyperlipidemia, and diabetes.   He has CAD and had left main 2-vessel disease by cath August 31, 2006, and underwent coronary artery bypass grafting x2 by Rick Melendez with a LIMA to his LAD, a vein to an obtuse marginal branch and right coronary arter. He has also had stenting of his left leg back in 2003, doppler in July 2013 showed Rt iliac disease but he has been asymptomatic. He denies chest pain or shortness of breath. He does have daytime fatigue and this is unchanged. He has had some leg pain which he attributes to statin Rx. His last Myoview was performed 07/06/13 was nonischemic.[name] He sees Rick Melendez for followup and treatment of his sleep apnea and he reports he is compliant with this.   Since I saw him in the office a year ago he continues to do well.  He denies chest pain , shortness of breath or claudication.   Current Meds  Medication Sig  . aspirin EC 81 MG tablet Take 81 mg by mouth daily.  . clopidogrel (PLAVIX) 75 MG tablet Take 75 mg by mouth daily.  . Coenzyme Q10 (COQ10) 200 MG CAPS Take 200 mg by mouth daily.  [name] ezetimibe (ZETIA) 10 MG tablet Take 10 mg by mouth daily.  . furosemide (LASIX) 20 MG tablet Take 1 tablet by mouth daily.  [name] gabapentin (NEURONTIN) 300 MG capsule Take 1 capsule by mouth at night for 7 nights and then  1 in the morning and one at night for 7 days and then 3 times a day.  [name] glipiZIDE (GLUCOTROL) 5 MG tablet Take 5 mg by mouth daily.  [name] HYDROcodone-acetaminophen (NORCO/VICODIN) 5-325 MG per tablet Take 1 tablet by mouth 3 (three) times daily.  [name] lisinopril (PRINIVIL,ZESTRIL) 20 MG tablet Take 20 mg by mouth daily.  . metoprolol tartrate (LOPRESSOR) 25 MG tablet Take 12.5 mg by mouth daily.   . Omega-3 Fatty Acids (OMEGA-3 FISH OIL) 1200 MG CAPS Take 2,400 mg by mouth 2 (two) times daily. Take 4 capsules daily  . omeprazole (PRILOSEC) 20 MG capsule Take 1 capsule by mouth daily.  Rick Melendez VERIO test strip U FOR GLUCOSE TESTING TID  . OVER THE COUNTER MEDICATION USES CPAP NIGHTLY  . rosuvastatin (CRESTOR) 10 MG tablet Take 10 mg by mouth at bedtime.     No Known Allergies  Social History   Socioeconomic History  . Marital status: Married    Spouse name: Not on file  . Number of children: Not on file  . Years of education: Not on file  . Highest education level: Not on file  Occupational History  . Not on file  Tobacco Use  . Smoking status: Former Smoker    Types: Cigarettes    Quit date: 09/03/1981    Years since quitting:  39.6  . Smokeless tobacco: Current User    Types: Chew  Substance and Sexual Activity  . Alcohol use: Yes  . Drug use: No  . Sexual activity: Not on file  Other Topics Concern  . Not on file  Social History Narrative  . Not on file   Social Determinants of Health   Financial Resource Strain: Not on file  Food Insecurity: Not on file  Transportation Needs: Not on file  Physical Activity: Not on file  Stress: Not on file  Social Connections: Not on file  Intimate Partner Violence: Not on file     Review of Systems: General: negative for chills, fever, night sweats or weight changes.  Cardiovascular: negative for chest pain, dyspnea on exertion, edema, orthopnea, palpitations, paroxysmal nocturnal dyspnea or shortness of breath Dermatological:  negative for rash Respiratory: negative for cough or wheezing Urologic: negative for hematuria Abdominal: negative for nausea, vomiting, diarrhea, bright red blood per rectum, melena, or hematemesis Neurologic: negative for visual changes, syncope, or dizziness All other systems reviewed and are otherwise negative except as noted above.    Blood pressure (!) 132/52, pulse (!) 48, height 5\' 8"  (1.727 m), weight 217 lb 6.4 oz (98.6 kg).  General appearance: alert and no distress Neck: no adenopathy, no carotid bruit, no JVD, supple, symmetrical, trachea midline and thyroid not enlarged, symmetric, no tenderness/mass/nodules Lungs: clear to auscultation bilaterally Heart: regular rate and rhythm, S1, S2 normal, no murmur, click, rub or gallop Extremities: extremities normal, atraumatic, no cyanosis or edema Pulses: 2+ and symmetric Skin: Skin color, texture, turgor normal. No rashes or lesions Neurologic: Alert and oriented X 3, normal strength and tone. Normal symmetric reflexes. Normal coordination and gait  EKG sinus bradycardia 48 with a nonspecific IVCD.  I personally reviewed this EKG.  ASSESSMENT AND PLAN:   CAD - CABG X 2 9/07 LIMA-LAD, SVG-OM. Low risk Myoview 7/12 History of CAD status post coronary artery bypass grafting x3 for left main/two-vessel disease 08/31/2006 by Dr. Servando Melendez with a LIMA to his LAD LAD and vein to an obtuse marginal branch right coronary artery.  He denies chest pain or shortness of breath.  HTN (hypertension) History of essential hypertension a blood pressure measured today 132/52.  He is on lisinopril and metoprolol.  Dyslipidemia History of dyslipidemia on Crestor and Zetia with lipid profile performed 10/03/2020 revealing total cholesterol of 90, LDL 35 and HDL 27.  Sleep apnea- compliant with C-pap History of obstructive sleep apnea followed by Rick Melendez in the past.  He does have CPAP which she wears intermittently because of the foul odor in his  machine.  We will have to have this addressed  PVD (peripheral vascular disease)- Rt iliac disease by doppler 7/13 History of peripheral arterial disease with Dopplers performed 06/27/2020 revealing normal ABIs bilaterally with mild to moderate iliac disease on the right and mild on the left.  The patient denies claudication.      Rick Harp MD FACP,FACC,FAHA, [facility] 04/11/2021 11:11 AM

## 2021-04-11 NOTE — Assessment & Plan Note (Signed)
History of peripheral arterial disease with Dopplers performed 06/27/2020 revealing normal ABIs bilaterally with mild to moderate iliac disease on the right and mild on the left.  The patient denies claudication.

## 2021-04-11 NOTE — Assessment & Plan Note (Signed)
History of obstructive sleep apnea followed by Dr. Claiborne Billings in the past.  He does have CPAP which she wears intermittently because of the foul odor in his machine.  We will have to have this addressed

## 2021-04-11 NOTE — Assessment & Plan Note (Signed)
History of CAD status post coronary artery bypass grafting x3 for left main/two-vessel disease 08/31/2006 by Dr. Servando Snare with a LIMA to his LAD LAD and vein to an obtuse marginal branch right coronary artery.  He denies chest pain or shortness of breath.

## 2021-04-17 DIAGNOSIS — M47896 Other spondylosis, lumbar region: Secondary | ICD-10-CM | POA: Diagnosis not present

## 2021-04-17 DIAGNOSIS — E119 Type 2 diabetes mellitus without complications: Secondary | ICD-10-CM | POA: Diagnosis not present

## 2021-04-17 DIAGNOSIS — E7800 Pure hypercholesterolemia, unspecified: Secondary | ICD-10-CM | POA: Diagnosis not present

## 2021-04-17 DIAGNOSIS — I10 Essential (primary) hypertension: Secondary | ICD-10-CM | POA: Diagnosis not present

## 2021-04-17 DIAGNOSIS — G4733 Obstructive sleep apnea (adult) (pediatric): Secondary | ICD-10-CM | POA: Diagnosis not present

## 2021-05-13 ENCOUNTER — Encounter: Payer: Self-pay | Admitting: Family

## 2021-05-13 ENCOUNTER — Ambulatory Visit: Payer: Medicare Other | Admitting: Family

## 2021-05-13 DIAGNOSIS — M1711 Unilateral primary osteoarthritis, right knee: Secondary | ICD-10-CM | POA: Diagnosis not present

## 2021-05-13 DIAGNOSIS — M1712 Unilateral primary osteoarthritis, left knee: Secondary | ICD-10-CM

## 2021-05-13 DIAGNOSIS — M170 Bilateral primary osteoarthritis of knee: Secondary | ICD-10-CM

## 2021-05-13 MED ORDER — LIDOCAINE HCL 1 % IJ SOLN
5.0000 mL | INTRAMUSCULAR | Status: AC | PRN
  Administered 2021-05-13: 5 mL

## 2021-05-13 MED ORDER — METHYLPREDNISOLONE ACETATE 40 MG/ML IJ SUSP
40.0000 mg | INTRAMUSCULAR | Status: AC | PRN
  Administered 2021-05-13: 40 mg via INTRA_ARTICULAR

## 2021-05-13 NOTE — Progress Notes (Signed)
Office Visit Note   Patient: Rick Melendez           Date of Birth: 08-26-1936           MRN: [id number] Visit Date: 05/13/2021              Requested by: Jani Gravel, [address] [place],  [place] [address] PCP: Jani Gravel, MD  No chief complaint on file.     HPI: Patient is an 85 year old gentleman who presents complaining of bilateral knee pain both anteriorly and laterally.  Complains of gradual increasing of his pain over the last few weeks.  Some locking and giving way.   Has had great interval relief last injections were in February of this year  Patient has had hyaluronic acid injections in the past and is status post CABG surgery.  Assessment & Plan: Visit Diagnoses:  No diagnosis found.  Plan: Bilateral knees Depo-Medrol injection.  Patient tolerated well.     Follow-Up Instructions: No follow-ups on file.   Ortho Exam  Patient is alert, oriented, no adenopathy, well-dressed, normal affect, normal respiratory effort. Examination patient has an antalgic gait with varus alignment of both knees he has crepitation with range of motion collaterals and  cruciates are stable.  Patient has pain to palpation primarily over the medial joint line.  Imaging: No results found. No images are attached to the encounter.  Labs: Lab Results  Component Value Date   REPTSTATUS 03/24/2017 FINAL 03/19/2017   CULT NO GROWTH 5 DAYS 03/19/2017     No results found for: ALBUMIN, PREALBUMIN, LABURIC  Lab Results  Component Value Date   MG 1.5 (L) 03/19/2017   No results found for: VD25OH  No results found for: PREALBUMIN CBC EXTENDED Latest Ref Rng & Units 03/20/2017 03/19/2017  WBC 4.0 - 10.5 K/uL 8.4 10.9(H)  RBC 4.22 - 5.81 MIL/uL 4.90 5.54  HGB 13.0 - 17.0 g/dL 12.4(L) 14.3  HCT 39.0 - 52.0 % 40.3 45.6  PLT 150 - 400 K/uL 146(L) 177  NEUTROABS 1.7 - 7.7 K/uL - 7.5  LYMPHSABS 0.7 - 4.0 K/uL - 1.7     There is no height or weight on file to  calculate BMI.  Orders:  No orders of the defined types were placed in this encounter.  No orders of the defined types were placed in this encounter.    Procedures: Large Joint Inj: bilateral knee on 05/13/2021 10:03 AM Indications: pain Details: 18 G 1.5 in needle, anteromedial approach Medications (Right): 5 mL lidocaine 1 %; 40 mg methylPREDNISolone acetate 40 MG/ML Medications (Left): 5 mL lidocaine 1 %; 40 mg methylPREDNISolone acetate 40 MG/ML Consent was given by the patient.      Clinical Data: No additional findings.  ROS:  All other systems negative, except as noted in the HPI. Review of Systems  Constitutional: Negative for chills and fever.  Musculoskeletal: Positive for arthralgias and gait problem.    Objective: Vital Signs: There were no vitals taken for this visit.  Specialty Comments:  No specialty comments available.  PMFS History: Patient Active Problem List   Diagnosis Date Noted  . Influenza B   . AKI (acute kidney injury) ([place])   . CAP (community acquired pneumonia) 03/19/2017  . Bilateral primary osteoarthritis of knee 01/28/2017  . Impingement syndrome of right shoulder 01/28/2017  . CAD - CABG X 2 9/07 LIMA-LAD, SVG-OM. Low risk Myoview 7/12 09/07/2013  . HTN (hypertension) 09/07/2013  . Dyslipidemia 09/07/2013  .  Diabetes mellitus ([employer]) 09/07/2013  . Chronic renal insufficiency, stage III (moderate)- SCr 1.6 (Aug 2013) 09/07/2013  . Sleep apnea- compliant with C-pap 09/07/2013  . PVD (peripheral vascular disease)- Rt iliac disease by doppler 7/13 09/07/2013   Past Medical History:  Diagnosis Date  . Arthritis   . Coronary artery disease 2007   cabg x3  . Diabetes mellitus without complication ([place])   . Hyperlipidemia   . Hypertension   . Influenza B 03/19/2017  . OSA on CPAP    Dr Claiborne Billings - follows and treatmentof sleep apnea  . PVD (peripheral vascular disease) ([place]) 2003   DR BERRY-  -LEFT LEG STENTING    Family History   Problem Relation Age of Onset  . Cancer Father   . Cancer Brother     Past Surgical History:  Procedure Laterality Date  . CARDIAC CATHETERIZATION  0911/2007   3 vessel CAD WITH LEFT MAIN CAD SEVERE ,norm lLIMA,LV nom. ,evidence for very mild aortic stenosis with gradient, mild stenosis distal abdnminal aotra and proximal left common iliac artery 30%  . CORONARY ARTERY BYPASS GRAFT  08/31/2006   DR GERHARDT---LIMA to LAD, VEIN TO AN OBTUSE MARGINAL BRANCH AND  RIGHT CORONARY ARTERY  . CPET  04/13/2012   normal  . DOPPLER ECHOCARDIOGRAPHY  07/08/2011   EF =>55%  . lower arterial doppler  07/01/2012   mildly abnormal lower extremity;ABIs >1 bilaterally with moderately high-grade right iliac stenosis that had not changed from proir study.  [name] NM MYOCAR PERF WALL MOTION  7/182012   EF 59%, normal myocardial perfusio   Social History   Occupational History  . Not on file  Tobacco Use  . Smoking status: Former Smoker    Types: Cigarettes    Quit date: 09/03/1981    Years since quitting: 39.7  . Smokeless tobacco: Current User    Types: Chew  Substance and Sexual Activity  . Alcohol use: Yes  . Drug use: No  . Sexual activity: Not on file

## 2021-06-10 DIAGNOSIS — G4733 Obstructive sleep apnea (adult) (pediatric): Secondary | ICD-10-CM | POA: Diagnosis not present

## 2021-06-30 ENCOUNTER — Other Ambulatory Visit (HOSPITAL_COMMUNITY): Payer: Self-pay | Admitting: Cardiovascular Disease

## 2021-06-30 ENCOUNTER — Ambulatory Visit (HOSPITAL_COMMUNITY): Payer: Medicare Other

## 2021-06-30 DIAGNOSIS — Z95828 Presence of other vascular implants and grafts: Secondary | ICD-10-CM

## 2021-06-30 DIAGNOSIS — I739 Peripheral vascular disease, unspecified: Secondary | ICD-10-CM

## 2021-07-03 ENCOUNTER — Telehealth: Payer: Self-pay | Admitting: Physical Medicine and Rehabilitation

## 2021-07-03 NOTE — Telephone Encounter (Signed)
Pt's wife calling to get an appt set up for inj. The best call back number is [phone].

## 2021-07-04 DIAGNOSIS — H10413 Chronic giant papillary conjunctivitis, bilateral: Secondary | ICD-10-CM | POA: Diagnosis not present

## 2021-07-07 ENCOUNTER — Telehealth: Payer: Self-pay | Admitting: Physical Medicine and Rehabilitation

## 2021-07-07 NOTE — Telephone Encounter (Signed)
Pt wife [name] returned call to [place]. Please call her back at [phone].

## 2021-07-07 NOTE — Telephone Encounter (Signed)
Pts wife [name] called on his behalf checking on setting an appt for an inj.; pt would like a CB today if possible.   [phone]

## 2021-07-17 ENCOUNTER — Ambulatory Visit: Payer: Self-pay

## 2021-07-17 ENCOUNTER — Other Ambulatory Visit: Payer: Self-pay

## 2021-07-17 ENCOUNTER — Encounter: Payer: Self-pay | Admitting: Physical Medicine and Rehabilitation

## 2021-07-17 ENCOUNTER — Ambulatory Visit: Payer: Medicare Other | Admitting: Physical Medicine and Rehabilitation

## 2021-07-17 VITALS — BP 147/67 | HR 66

## 2021-07-17 DIAGNOSIS — M48062 Spinal stenosis, lumbar region with neurogenic claudication: Secondary | ICD-10-CM | POA: Diagnosis not present

## 2021-07-17 DIAGNOSIS — M5116 Intervertebral disc disorders with radiculopathy, lumbar region: Secondary | ICD-10-CM

## 2021-07-17 MED ORDER — METHYLPREDNISOLONE ACETATE 80 MG/ML IJ SUSP
80.0000 mg | Freq: Once | INTRAMUSCULAR | Status: AC
  Administered 2021-07-17: 80 mg

## 2021-07-17 NOTE — Progress Notes (Signed)
Rick Melendez - 85 y.o. male MRN [id number]  Date of birth: 1936-01-23  Office Visit Note: Visit Date: 07/17/2021 PCP: Janie Morning, DO Referred by: Jani Gravel, MD  Subjective: Chief Complaint  Patient presents with   Lower Back - Pain   Left Leg - Pain   Right Leg - Pain   HPI:  Rick Melendez is a 85 y.o. male who comes in today for planned repeat Bilateral L4-L5  Lumbar Transforaminal epidural steroid injection with fluoroscopic guidance.  The patient has failed conservative care including home exercise, medications, time and activity modification.  This injection will be diagnostic and hopefully therapeutic.  Please see requesting physician notes for further details and justification. Patient received more than 50% pain relief from prior injection.   Referring: Dr. Meridee Score   ROS Otherwise per HPI.  Assessment & Plan: Visit Diagnoses:    ICD-10-CM   1. Radiculopathy due to lumbar intervertebral disc disorder  M51.16 XR C-ARM NO REPORT    Epidural Steroid injection    methylPREDNISolone acetate (DEPO-MEDROL) injection 80 mg    2. Spinal stenosis of lumbar region with neurogenic claudication  M48.062 XR C-ARM NO REPORT    Epidural Steroid injection    methylPREDNISolone acetate (DEPO-MEDROL) injection 80 mg      Plan: No additional findings.   Meds & Orders:  Meds ordered this encounter  Medications   methylPREDNISolone acetate (DEPO-MEDROL) injection 80 mg    Orders Placed This Encounter  Procedures   XR C-ARM NO REPORT   Epidural Steroid injection    Follow-up: Return if symptoms worsen or fail to improve.   Procedures: No procedures performed  Lumbosacral Transforaminal Epidural Steroid Injection - Sub-Pedicular Approach with Fluoroscopic Guidance  Patient: Rick Melendez      Date of Birth: October 02, 1936 MRN: [id number] PCP: Janie Morning, DO      Visit Date: 07/17/2021   Universal Protocol:    Date/Time: 07/17/2021  Consent Given By: the  patient  Position: PRONE  Additional Comments: Vital signs were monitored before and after the procedure. Patient was prepped and draped in the usual sterile fashion. The correct patient, procedure, and site was verified.   Injection Procedure Details:   Procedure diagnoses: Radiculopathy due to lumbar intervertebral disc disorder [M51.16]    Meds Administered:  Meds ordered this encounter  Medications   methylPREDNISolone acetate (DEPO-MEDROL) injection 80 mg    Laterality: Bilateral  Location/Site:  L4-L5  Needle:5.0 in., 22 ga.  Short bevel or Quincke spinal needle  Needle Placement: Transforaminal  Findings:    -Comments: Excellent flow of contrast along the nerve, nerve root and into the epidural space.  Procedure Details: After squaring off the end-plates to get a true AP view, the C-arm was positioned so that an oblique view of the foramen as noted above was visualized. The target area is just inferior to the "nose of the scotty dog" or sub pedicular. The soft tissues overlying this structure were infiltrated with 2-3 ml. of 1% Lidocaine without Epinephrine.  The spinal needle was inserted toward the target using a "trajectory" view along the fluoroscope beam.  Under AP and lateral visualization, the needle was advanced so it did not puncture dura and was located close the 6 O'Clock position of the pedical in AP tracterory. Biplanar projections were used to confirm position. Aspiration was confirmed to be negative for CSF and/or blood. A 1-2 ml. volume of Isovue-250 was injected and flow of contrast was noted at each level.  Radiographs were obtained for documentation purposes.   After attaining the desired flow of contrast documented above, a 0.5 to 1.0 ml test dose of 0.25% Marcaine was injected into each respective transforaminal space.  The patient was observed for 90 seconds post injection.  After no sensory deficits were reported, and normal lower extremity motor  function was noted,   the above injectate was administered so that equal amounts of the injectate were placed at each foramen (level) into the transforaminal epidural space.   Additional Comments:  The patient tolerated the procedure well Dressing: 2 x 2 sterile gauze and Band-Aid    Post-procedure details: Patient was observed during the procedure. Post-procedure instructions were reviewed.  Patient left the clinic in stable condition.   Clinical History: MRI LUMBAR SPINE WITHOUT CONTRAST   TECHNIQUE: Multiplanar, multisequence MR imaging of the lumbar spine was performed. No intravenous contrast was administered.   COMPARISON:  Lumbar MRI 05/27/2014   FINDINGS: Segmentation:  Normal.  Lowest disc space L5-S1   Alignment: Mild anterolisthesis T12-L1. 4 mm retrolisthesis L1-2. Slight retrolisthesis L3-4. 6 mm anterolisthesis L4-5. Mild retrolisthesis L5-S1.   Vertebrae:  Normal bone marrow.  Negative for fracture or mass.   Conus medullaris and cauda equina: Conus extends to the L1-2 level. Conus and cauda equina appear normal.   Paraspinal and other soft tissues: Negative for mass or adenopathy. No soft tissue edema or fluid collection.   Disc levels:   T12-L1: Advanced disc degeneration left greater than right. Prominent left-sided spurring. Bilateral facet degeneration. Moderate spinal stenosis. Severe subarticular and foraminal stenosis on the left. Mild subarticular stenosis on the right. Mild progression of stenosis on the left since the prior study   L1-2: Advanced disc degeneration with diffuse endplate spurring and marked disc space narrowing. Mild facet degeneration. Mild to moderate spinal stenosis. Moderate subarticular stenosis bilaterally. No interval change   L2-3: Disc degeneration with diffuse disc bulging and endplate spurring. Mild facet degeneration. Mild subarticular stenosis bilaterally with mild progression   L3-4: Disc degeneration with  diffuse disc bulging and spurring, right greater than left. Mild facet degeneration. Mild spinal stenosis. Moderate to severe right subarticular stenosis. Moderate left subarticular stenosis. No significant change   L4-5: 6 mm anterolisthesis with severe facet degeneration and diffuse bulging of the disc. Severe spinal stenosis and severe subarticular stenosis bilaterally have progressed since the prior study. In addition, there is severe right foraminal encroachment with right L4 nerve root impingement and moderate left foraminal encroachment.   L5-S1: Left paracentral disc protrusion with compression of the left S1 nerve root similar to the prior study. Mild facet degeneration bilaterally.   IMPRESSION: Advanced multilevel degenerative change throughout the lumbar spine. There has been progression of degenerative changes stenosis at multiple levels as described above   6 mm anterolisthesis L4-5. Severe spinal and severe subarticular stenosis bilaterally have progressed in the interval. Severe right foraminal encroachment.     Electronically Signed   By: Franchot Gallo M.D.   On: 08/22/2020 21:23     Objective:  VS:  HT:    WT:   BMI:     BP:(!) 147/67  HR:66bpm  TEMP: ( )  RESP:  Physical Exam Vitals and nursing note reviewed.  Constitutional:      General: He is not in acute distress.    Appearance: Normal appearance. He is obese. He is not ill-appearing.  HENT:     Head: Normocephalic and atraumatic.     Right Ear: External ear normal.  Left Ear: External ear normal.     Nose: No congestion.  Eyes:     Extraocular Movements: Extraocular movements intact.  Cardiovascular:     Rate and Rhythm: Normal rate.     Pulses: Normal pulses.  Pulmonary:     Effort: Pulmonary effort is normal. No respiratory distress.  Abdominal:     General: There is no distension.     Palpations: Abdomen is soft.  Musculoskeletal:        General: No tenderness or signs of  injury.     Cervical back: Neck supple.     Right lower leg: No edema.     Left lower leg: No edema.     Comments: Patient has good distal strength without clonus.  Skin:    Findings: No erythema or rash.  Neurological:     General: No focal deficit present.     Mental Status: He is alert and oriented to person, place, and time.     Sensory: No sensory deficit.     Motor: No weakness or abnormal muscle tone.     Coordination: Coordination normal.  Psychiatric:        Mood and Affect: Mood normal.        Behavior: Behavior normal.     Imaging: No results found.

## 2021-07-17 NOTE — Patient Instructions (Signed)

## 2021-07-17 NOTE — Progress Notes (Signed)
Pt state lower back pain that travels to both leg. Pt state walking makes the pain worse. Pt state he take pain meds to help ease his pain. Pt has hx of inj on 01/13/21 pt state it helped.  Numeric Pain Rating Scale and Functional Assessment Average Pain 6   In the last MONTH (on 0-10 scale) has pain interfered with the following?  1. General activity like being  able to carry out your everyday physical activities such as walking, climbing stairs, carrying groceries, or moving a chair?  Rating(10)   +Driver, +BT, -Dye Allergies.

## 2021-07-18 ENCOUNTER — Ambulatory Visit (HOSPITAL_BASED_OUTPATIENT_CLINIC_OR_DEPARTMENT_OTHER)
Admission: RE | Admit: 2021-07-18 | Discharge: 2021-07-18 | Disposition: A | Discharge status: Home / Self Care | Payer: Medicare Other | Source: Ambulatory Visit | Attending: Cardiovascular Disease | Admitting: Cardiovascular Disease

## 2021-07-18 ENCOUNTER — Other Ambulatory Visit (HOSPITAL_COMMUNITY): Payer: Self-pay | Admitting: Cardiovascular Disease

## 2021-07-18 ENCOUNTER — Ambulatory Visit (HOSPITAL_COMMUNITY)
Admission: RE | Admit: 2021-07-18 | Discharge: 2021-07-18 | Disposition: A | Discharge status: Home / Self Care | Payer: Medicare Other | Source: Ambulatory Visit | Attending: Cardiovascular Disease | Admitting: Cardiovascular Disease

## 2021-07-18 DIAGNOSIS — I739 Peripheral vascular disease, unspecified: Secondary | ICD-10-CM | POA: Insufficient documentation

## 2021-07-18 DIAGNOSIS — Z95828 Presence of other vascular implants and grafts: Secondary | ICD-10-CM

## 2021-07-22 ENCOUNTER — Encounter: Payer: Self-pay | Admitting: *Deleted

## 2021-07-22 NOTE — Procedures (Signed)
Lumbosacral Transforaminal Epidural Steroid Injection - Sub-Pedicular Approach with Fluoroscopic Guidance  Patient: Rick Melendez      Date of Birth: 1936-06-19 MRN: [id number] PCP: Janie Morning, DO      Visit Date: 07/17/2021   Universal Protocol:    Date/Time: 07/17/2021  Consent Given By: the patient  Position: PRONE  Additional Comments: Vital signs were monitored before and after the procedure. Patient was prepped and draped in the usual sterile fashion. The correct patient, procedure, and site was verified.   Injection Procedure Details:   Procedure diagnoses: Radiculopathy due to lumbar intervertebral disc disorder [M51.16]    Meds Administered:  Meds ordered this encounter  Medications   methylPREDNISolone acetate (DEPO-MEDROL) injection 80 mg    Laterality: Bilateral  Location/Site:  L4-L5  Needle:5.0 in., 22 ga.  Short bevel or Quincke spinal needle  Needle Placement: Transforaminal  Findings:    -Comments: Excellent flow of contrast along the nerve, nerve root and into the epidural space.  Procedure Details: After squaring off the end-plates to get a true AP view, the C-arm was positioned so that an oblique view of the foramen as noted above was visualized. The target area is just inferior to the "nose of the scotty dog" or sub pedicular. The soft tissues overlying this structure were infiltrated with 2-3 ml. of 1% Lidocaine without Epinephrine.  The spinal needle was inserted toward the target using a "trajectory" view along the fluoroscope beam.  Under AP and lateral visualization, the needle was advanced so it did not puncture dura and was located close the 6 O'Clock position of the pedical in AP tracterory. Biplanar projections were used to confirm position. Aspiration was confirmed to be negative for CSF and/or blood. A 1-2 ml. volume of Isovue-250 was injected and flow of contrast was noted at each level. Radiographs were obtained for documentation  purposes.   After attaining the desired flow of contrast documented above, a 0.5 to 1.0 ml test dose of 0.25% Marcaine was injected into each respective transforaminal space.  The patient was observed for 90 seconds post injection.  After no sensory deficits were reported, and normal lower extremity motor function was noted,   the above injectate was administered so that equal amounts of the injectate were placed at each foramen (level) into the transforaminal epidural space.   Additional Comments:  The patient tolerated the procedure well Dressing: 2 x 2 sterile gauze and Band-Aid    Post-procedure details: Patient was observed during the procedure. Post-procedure instructions were reviewed.  Patient left the clinic in stable condition.

## 2021-08-19 DIAGNOSIS — C44329 Squamous cell carcinoma of skin of other parts of face: Secondary | ICD-10-CM | POA: Diagnosis not present

## 2021-08-19 DIAGNOSIS — L570 Actinic keratosis: Secondary | ICD-10-CM | POA: Diagnosis not present

## 2021-08-19 DIAGNOSIS — C44629 Squamous cell carcinoma of skin of left upper limb, including shoulder: Secondary | ICD-10-CM | POA: Diagnosis not present

## 2021-09-01 DIAGNOSIS — Z0000 Encounter for general adult medical examination without abnormal findings: Secondary | ICD-10-CM | POA: Diagnosis not present

## 2021-09-01 DIAGNOSIS — M545 Low back pain, unspecified: Secondary | ICD-10-CM | POA: Diagnosis not present

## 2021-09-01 DIAGNOSIS — E118 Type 2 diabetes mellitus with unspecified complications: Secondary | ICD-10-CM | POA: Diagnosis not present

## 2021-09-01 DIAGNOSIS — E7800 Pure hypercholesterolemia, unspecified: Secondary | ICD-10-CM | POA: Diagnosis not present

## 2021-09-01 DIAGNOSIS — E119 Type 2 diabetes mellitus without complications: Secondary | ICD-10-CM | POA: Diagnosis not present

## 2021-09-01 DIAGNOSIS — I2510 Atherosclerotic heart disease of native coronary artery without angina pectoris: Secondary | ICD-10-CM | POA: Diagnosis not present

## 2021-09-01 DIAGNOSIS — G4733 Obstructive sleep apnea (adult) (pediatric): Secondary | ICD-10-CM | POA: Diagnosis not present

## 2021-09-01 DIAGNOSIS — I10 Essential (primary) hypertension: Secondary | ICD-10-CM | POA: Diagnosis not present

## 2021-09-01 DIAGNOSIS — H9190 Unspecified hearing loss, unspecified ear: Secondary | ICD-10-CM | POA: Diagnosis not present

## 2021-09-01 DIAGNOSIS — M47896 Other spondylosis, lumbar region: Secondary | ICD-10-CM | POA: Diagnosis not present

## 2021-09-01 DIAGNOSIS — E789 Disorder of lipoprotein metabolism, unspecified: Secondary | ICD-10-CM | POA: Diagnosis not present

## 2021-09-01 DIAGNOSIS — Z23 Encounter for immunization: Secondary | ICD-10-CM | POA: Diagnosis not present

## 2021-09-09 DIAGNOSIS — G4733 Obstructive sleep apnea (adult) (pediatric): Secondary | ICD-10-CM | POA: Diagnosis not present

## 2021-10-01 DIAGNOSIS — Z951 Presence of aortocoronary bypass graft: Secondary | ICD-10-CM | POA: Diagnosis not present

## 2021-10-01 DIAGNOSIS — I10 Essential (primary) hypertension: Secondary | ICD-10-CM | POA: Diagnosis not present

## 2021-10-01 DIAGNOSIS — K219 Gastro-esophageal reflux disease without esophagitis: Secondary | ICD-10-CM | POA: Diagnosis not present

## 2021-10-01 DIAGNOSIS — E118 Type 2 diabetes mellitus with unspecified complications: Secondary | ICD-10-CM | POA: Diagnosis not present

## 2021-10-01 DIAGNOSIS — E039 Hypothyroidism, unspecified: Secondary | ICD-10-CM | POA: Diagnosis not present

## 2021-10-01 DIAGNOSIS — Z23 Encounter for immunization: Secondary | ICD-10-CM | POA: Diagnosis not present

## 2021-10-01 DIAGNOSIS — E789 Disorder of lipoprotein metabolism, unspecified: Secondary | ICD-10-CM | POA: Diagnosis not present

## 2021-11-03 ENCOUNTER — Encounter: Payer: Self-pay | Admitting: Cardiovascular Disease

## 2021-11-03 ENCOUNTER — Ambulatory Visit: Payer: Medicare Other | Admitting: Cardiovascular Disease

## 2021-11-03 ENCOUNTER — Other Ambulatory Visit: Payer: Self-pay

## 2021-11-03 VITALS — BP 108/56 | HR 53 | Ht 67.0 in | Wt 201.8 lb

## 2021-11-03 DIAGNOSIS — E118 Type 2 diabetes mellitus with unspecified complications: Secondary | ICD-10-CM | POA: Diagnosis not present

## 2021-11-03 DIAGNOSIS — E785 Hyperlipidemia, unspecified: Secondary | ICD-10-CM | POA: Diagnosis not present

## 2021-11-03 DIAGNOSIS — I10 Essential (primary) hypertension: Secondary | ICD-10-CM

## 2021-11-03 DIAGNOSIS — I2510 Atherosclerotic heart disease of native coronary artery without angina pectoris: Secondary | ICD-10-CM

## 2021-11-03 DIAGNOSIS — I739 Peripheral vascular disease, unspecified: Secondary | ICD-10-CM | POA: Diagnosis not present

## 2021-11-03 DIAGNOSIS — G4733 Obstructive sleep apnea (adult) (pediatric): Secondary | ICD-10-CM | POA: Diagnosis not present

## 2021-11-03 NOTE — Patient Instructions (Signed)
Medication Instructions:  Continue current medications. No changes.  *If you need a refill on your cardiac medications before your next appointment, please call your pharmacy*   Lab Work: none If you have labs (blood work) drawn today and your tests are completely normal, you will receive your results only by: [place] (if you have MyChart) OR A paper copy in the mail If you have any lab test that is abnormal or we need to change your treatment, we will call you to review the results.   Testing/Procedures: none   Follow-Up: At [facility], you and your health needs are our priority.  As part of our continuing mission to provide you with exceptional heart care, we have created designated Provider Care Teams.  These Care Teams include your primary Cardiologist (physician) and Advanced Practice Providers (APPs -  Physician Assistants and Nurse Practitioners) who all work together to provide you with the care you need, when you need it.  We recommend signing up for the patient portal called "MyChart".  Sign up information is provided on this After Visit Summary.  MyChart is used to connect with patients for Virtual Visits (Telemedicine).  Patients are able to view lab/test results, encounter notes, upcoming appointments, etc.  Non-urgent messages can be sent to your provider as well.   To learn more about what you can do with MyChart, go to [email or web address].    Your next appointment:   6 month(s)  The format for your next appointment:   In Person  Provider:   Dr. Corky Downs, MD

## 2021-11-03 NOTE — Progress Notes (Signed)
Patient ID: Rick Melendez, male   DOB: 1936/05/20, 85 y.o.   MRN: [id number]     HPI: Rick Melendez is a 85 y.o. male who presents for a 4 year follow-up sleep clinic evaluation.  He is followed by Dr. Gwenlyn Found for cardiology care and Dr. Deland Pretty for primary care.  He had recently seen Dr. Shelia Media and is referred back for evaluation of his obstructive sleep apnea.  Rick Melendez has a history of hypertension, hyperlipidemia, CAD, type 2 diabetes mellitus, and is status post CABG revascularization surgery in September 2007.  He also has a history of peripheral vascular disease and remotely had undergone left common iliac arterial stent placement in 2003. In May 2013 he was diagnosed as having severe obstructive sleep apnea at the [place] and his AHI was 71.3 with significant oxygen desaturation to 81%.  There was evidence for severe snoring and also periodic limb movements.  The patient has been on CPAP therapy since that time and has been using [facility] for his DME.  Apparently, with the recent Medicare contract renewals, he had to switch to [employer] for his DME company and has required 2 had a face-to-face evaluation prior to obtaining new supplies.  I saw him in 2017 he was stable and had excellent  CPAP compliance.  Over the prior year he continued to feel well.  He was going to bed between to bed 11 - 11:30 PM and waking up at 6 AM.  He had lost 25 pounds since initiating CPAP therapy.  He had been using a Swift  FX nasal pillow mask and had a Respironics RemStar Pro CPAP unit.  He denied any residual daytime sleepiness, awareness of breakthrough snoring, painful restless legs, or hypnagogic hallucinations.     Epworth Sleepiness Scale: Situation   Chance of Dozing/Sleeping (0 = never , 1 = slight chance , 2 = moderate chance , 3 = high chance )   sitting and reading 1   watching TV 2   sitting inactive in a public place 1   being a passenger in a motor vehicle for  an hour or more 1   lying down in the afternoon 3   sitting and talking to someone 0   sitting quietly after lunch (no alcohol) 1   while stopped for a few minutes in traffic as the driver 0   Total Score  9   I last saw him on July 26, 2017 after he had recently received a new ResMed air sense 10 CPAP unit with set up date on May 05, 2017.  His CPAP was set at 10 cm water pressure.  A download from 06/23/2017 through 07/22/2017 showed 100% compliance but average usage was only 5 hours and 20 minutes per night.  AHI remains excellent at 0.7.  He states that he does breathe through his mouth and he had significant leak.   He denies any residual daytime sleepiness.  A new Epworth scale was calculated in the office  and this endorsed at 7.    Over the last 4+ years, he has continued to be followed by Dr. Gwenlyn Found for his cardiology care and Dr. Deland Pretty for primary care.  He continues to be on DAPT with aspirin/Plavix.  For blood pressure control he is on furosemide 20 mg, lisinopril 20 mg, metoprolol titrate only 12.5 mg daily.  He is on Zetia and rosuvastatin 10 mg for hyperlipidemia.  Rick Melendez had recently seen  Dr. Deland Pretty on September 01, 2021 and due to noncompliance in this patient with previously diagnosed severe obstructive sleep apnea he was referred back for reevaluation with me.  A download was obtained from October 13 through October 31, 2021.  This revealed only 5 of 30 days with use at 17% with average use at 7 hours and 39 minutes.  His unit is not an auto unit and is set at a set pressure of 10 cm.  When used, AHI is 2.2.  Typically he has been going to bed between 8:30 and 9 pm and tends to wake up at 6 AM.  However he wakes up in the middle of the night and watches television.  He has had difficulty getting back to sleep.  He has nocturia 2-3 times per night.  With his old mask there is significant leak and as result he has not been compliant.  He presents for sleep evaluation.  A  new Epworth Sleepiness Scale was calculated in the office today and this endorsed at 9.  Past Medical History:  Diagnosis Date   Arthritis    Coronary artery disease 2007   cabg x3   Diabetes mellitus without complication ([place])    Hyperlipidemia    Hypertension    Influenza B 03/19/2017   OSA on CPAP    Dr Claiborne Billings - follows and treatmentof sleep apnea   PVD (peripheral vascular disease) ([place]) 2003   DR BERRY-  -LEFT LEG STENTING    Past Surgical History:  Procedure Laterality Date   CARDIAC CATHETERIZATION  0911/2007   3 vessel CAD WITH LEFT MAIN CAD SEVERE ,norm lLIMA,LV nom. ,evidence for very mild aortic stenosis with gradient, mild stenosis distal abdnminal aotra and proximal left common iliac artery 30%   CORONARY ARTERY BYPASS GRAFT  08/31/2006   DR GERHARDT---LIMA to LAD, VEIN TO AN OBTUSE MARGINAL BRANCH AND  RIGHT CORONARY ARTERY   CPET  04/13/2012   normal   DOPPLER ECHOCARDIOGRAPHY  07/08/2011   EF =>55%   lower arterial doppler  07/01/2012   mildly abnormal lower extremity;ABIs >1 bilaterally with moderately high-grade right iliac stenosis that had not changed from proir study.   NM MYOCAR PERF WALL MOTION  7/182012   EF 59%, normal myocardial perfusio    No Known Allergies  Current Outpatient Medications  Medication Sig Dispense Refill   aspirin EC 81 MG tablet Take 81 mg by mouth daily.     clopidogrel (PLAVIX) 75 MG tablet Take 75 mg by mouth daily.     ezetimibe (ZETIA) 10 MG tablet Take 10 mg by mouth daily.     furosemide (LASIX) 20 MG tablet Take 1 tablet by mouth daily.  5   glipiZIDE (GLUCOTROL) 5 MG tablet Take 5 mg by mouth daily.     lisinopril (PRINIVIL,ZESTRIL) 20 MG tablet Take 20 mg by mouth daily.     metoprolol tartrate (LOPRESSOR) 25 MG tablet Take 12.5 mg by mouth daily.      ONETOUCH VERIO test strip U FOR GLUCOSE TESTING TID  1   OVER THE COUNTER MEDICATION USES CPAP NIGHTLY     OZEMPIC, 0.25 OR 0.5 MG/DOSE, 2 MG/1.5ML SOPN SMARTSIG:0.5 SUB-Q  Once a Week     rosuvastatin (CRESTOR) 10 MG tablet Take 10 mg by mouth at bedtime.  0   traMADol (ULTRAM) 50 MG tablet Take 50-100 mg by mouth 3 (three) times daily.     Coenzyme Q10 (COQ10) 200 MG CAPS Take 200 mg by mouth  daily. (Patient not taking: Reported on 11/03/2021) 90 capsule 3   gabapentin (NEURONTIN) 300 MG capsule Take 1 capsule by mouth at night for 7 nights and then 1 in the morning and one at night for 7 days and then 3 times a day. (Patient not taking: Reported on 11/03/2021) 90 capsule 1   HYDROcodone-acetaminophen (NORCO/VICODIN) 5-325 MG per tablet Take 1 tablet by mouth 3 (three) times daily. (Patient not taking: Reported on 11/03/2021)  0   Omega-3 Fatty Acids (OMEGA-3 FISH OIL) 1200 MG CAPS Take 2,400 mg by mouth 2 (two) times daily. Take 4 capsules daily (Patient not taking: Reported on 11/03/2021)     omeprazole (PRILOSEC) 20 MG capsule Take 1 capsule by mouth daily. (Patient not taking: Reported on 11/03/2021)  0   No current facility-administered medications for this visit.    Social History   Socioeconomic History   Marital status: Married    Spouse name: Not on file   Number of children: Not on file   Years of education: Not on file   Highest education level: Not on file  Occupational History   Not on file  Tobacco Use   Smoking status: Former    Types: Cigarettes    Quit date: 09/03/1981    Years since quitting: 40.2   Smokeless tobacco: Current    Types: Chew  Substance and Sexual Activity   Alcohol use: Yes   Drug use: No   Sexual activity: Not on file  Other Topics Concern   Not on file  Social History Narrative   Not on file   Social Determinants of Health   Financial Resource Strain: Not on file  Food Insecurity: Not on file  Transportation Needs: Not on file  Physical Activity: Not on file  Stress: Not on file  Social Connections: Not on file  Intimate Partner Violence: Not on file    Family History  Problem Relation Age of Onset    Cancer Father    Cancer Brother      ROS General: Negative; No fevers, chills, or night sweats HEENT: Negative; No changes in vision or hearing, sinus congestion, difficulty swallowing Pulmonary: Negative; No cough, wheezing, shortness of breath, hemoptysis Cardiovascular: CAD, status post CABG; PVD; no recent chest pain GI: Negative; No nausea, vomiting, diarrhea, or abdominal pain GU: Negative; No dysuria, hematuria, or difficulty voiding Musculoskeletal: Radiculopathy due to lumbar intervertebral disc disorder treated with epidural steroid injection Hematologic: Negative; no easy bruising, bleeding Endocrine: Negative; no heat/cold intolerance Neuro: Negative; no changes in balance, headaches Skin: Negative; No rashes or skin lesions Psychiatric: Negative; No behavioral problems, depression Sleep: Positive for obstructive sleep apnea on CPAP therapy; No residual daytime sleepiness, hypersomnolence, bruxism, restless legs, hypnogognic hallucinations, no cataplexy   Physical Exam BP (!) 108/56   Pulse (!) 53   Ht 5\' 7"  (1.702 m)   Wt 201 lb 12.8 oz (91.5 kg)   SpO2 96%   BMI 31.61 kg/m    Repeat blood pressure by me was 112/60  Wt Readings from Last 3 Encounters:  11/03/21 201 lb 12.8 oz (91.5 kg)  04/11/21 217 lb 6.4 oz (98.6 kg)  11/05/20 216 lb (98 kg)   General: Alert, oriented, no distress.  Skin: normal turgor, no rashes, warm and dry HEENT: Normocephalic, atraumatic. Pupils equal round and reactive to light; sclera anicteric; extraocular muscles intact; arcus senilis Nose without nasal septal hypertrophy Mouth/Parynx benign; Mallinpatti scale 3/4 Neck: No JVD, no carotid bruits; normal carotid upstroke Lungs: clear  to ausculatation and percussion; no wheezing or rales Chest wall: without tenderness to palpitation Heart: PMI not displaced, RRR, s1 s2 normal, 1/6 systolic murmur, no diastolic murmur, no rubs, gallops, thrills, or heaves Abdomen: soft, nontender;  no hepatosplenomehaly, BS+; abdominal aorta nontender and not dilated by palpation. Back: no CVA tenderness Pulses 2+ Musculoskeletal: Radiculopathy due to lumbar intervertebral disc disorder Extremities: no clubbing cyanosis or edema, Homan's sign negative  Neurologic: grossly nonfocal; Cranial nerves grossly wnl Psychologic: Normal mood and affect    November 02, 2021 ECG (independently read by me): Sinus bradycardia at 53, 1st degree AV block; PR 220 msec   LABS:  BMP Latest Ref Rng & Units 03/20/2017 03/19/2017  Glucose 65 - 99 mg/dL 125(H) 177(H)  BUN 6 - 20 mg/dL 30(H) 33(H)  Creatinine 0.61 - 1.24 mg/dL 1.52(H) 2.02(H)  Sodium 135 - 145 mmol/L 137 134(L)  Potassium 3.5 - 5.1 mmol/L 4.3 4.8  Chloride 101 - 111 mmol/L 104 100(L)  CO2 22 - 32 mmol/L 23 23  Calcium 8.9 - 10.3 mg/dL 8.1(L) 8.7(L)     No flowsheet data found.   CBC Latest Ref Rng & Units 03/20/2017 03/19/2017  WBC 4.0 - 10.5 K/uL 8.4 10.9(H)  Hemoglobin 13.0 - 17.0 g/dL 12.4(L) 14.3  Hematocrit 39.0 - 52.0 % 40.3 45.6  Platelets 150 - 400 K/uL 146(L) 177     Lipid Panel  No results found for: CHOL, TRIG, HDL, CHOLHDL, VLDL, LDLCALC, LDLDIRECT   RADIOLOGY: No results found.  IMPRESSION:  1. Obstructive sleep apnea syndrome   2. Coronary artery disease involving native coronary artery of native heart without angina pectoris   3. Primary hypertension   4. Dyslipidemia   5. PVD (peripheral vascular disease)- Rt iliac disease by doppler 7/13   6. Type 2 diabetes mellitus with complication, without long-term current use of insulin (HCC)     ASSESSMENT AND PLAN: Mr. Kadden Osterhout is an 85 year old male who has significant cardiovascular comorbidities including hypertension, hyperlipidemia, CAD, PVD, and is status post CABG revascularization surgery.  He has previously documented severe [facility] sleep [facility] with an AHI of 71.3/h and significant oxygen  desaturation to a nadir of 81%.  He has been on CPAP therapy since 2013.  He received a new ResMed air sense 10 CPAP unit which is not an auto unit on May 05, 2017.  His pressure has been set at 10 cm.  When I last saw him in 2018 he was compliant but was having some optimal sleep duration.  He has continued to be followed by Dr. Gwenlyn Found and Deland Pretty.  His blood pressure today is stable on furosemide 20 mg lisinopril 20 mg and once a day metoprolol tartrate 12.5 mg.  His ECG reveals sinus bradycardia at 53 bpm.  He continues to be on ezetimibe 10 mg and rosuvastatin 10 mg for hyperlipidemia with target LDL less than 70.  With reference to his CPAP he is now using [facility] as his DME.  His most recent download shows that over the last month usage was only 5 of 30 days but when used AHI was 2.4.  He has had difficulty with his back and has difficulty sleeping in bed.  Oftentimes he watches television in the middle of the night.  Currently he experiences nocturia approximately 2-3 times per night.  I again had a lengthy discussion with him regarding sleep apnea and its effects on normal sleep architecture and potential adverse cardiovascular  consequences.  We discussed its role in blood pressure and increased arousals with increased sympathetic tone contributing to nocturnal palpitations and increased risk for atrial fibrillation as well as absence of the normal diastolic dip with normal sleep.  He has had difficulty with his nasal pillow mask with significant leak.  He has not had any anginal symptomatology or he denies any exertional dyspnea.  I have provided him with a new sample mask today which is a ResMed AirFit N 30i mask which I believe he will tolerate significantly better than his previous nasal pillow mask.  Oftentimes nasal pillows contribute to significant soreness around the nares and when this occurs it is difficult to wear.  In addition, the advantage of the tubing arising from the crown of the  head was discussed and this should reduce any potential mask slippage with body turning.  I am also recommending changing of his parameter and will increase his start pressure during ramp time to 7 cm.  I am also increasing his CPAP pressure to 12 cm of water.  His CPAP unit is now almost 15-1/83 years old.  As long as he resumes compliance, next year he would qualify for a new ResMed air sense 11 CPAP auto unit.  I answered all his questions and strongly encouraged resumption of compliance.  He will contact us if he has issues with his new mask.  I will see him in 6 months for reevaluation or sooner as needed.   Time spent 40 minutes  Troy Sine, MD, [facility]  11/10/2021 5:18 PM

## 2021-11-10 ENCOUNTER — Encounter: Payer: Self-pay | Admitting: Cardiovascular Disease

## 2021-11-12 DIAGNOSIS — E113291 Type 2 diabetes mellitus with mild nonproliferative diabetic retinopathy without macular edema, right eye: Secondary | ICD-10-CM | POA: Diagnosis not present

## 2021-11-12 DIAGNOSIS — Z961 Presence of intraocular lens: Secondary | ICD-10-CM | POA: Diagnosis not present

## 2021-11-12 DIAGNOSIS — H4720 Unspecified optic atrophy: Secondary | ICD-10-CM | POA: Diagnosis not present

## 2021-11-12 DIAGNOSIS — H524 Presbyopia: Secondary | ICD-10-CM | POA: Diagnosis not present

## 2021-11-17 DIAGNOSIS — R0789 Other chest pain: Secondary | ICD-10-CM | POA: Diagnosis not present

## 2021-11-17 DIAGNOSIS — M542 Cervicalgia: Secondary | ICD-10-CM | POA: Diagnosis not present

## 2021-11-20 DIAGNOSIS — M542 Cervicalgia: Secondary | ICD-10-CM | POA: Diagnosis not present

## 2021-11-20 DIAGNOSIS — R531 Weakness: Secondary | ICD-10-CM | POA: Diagnosis not present

## 2021-11-20 DIAGNOSIS — R293 Abnormal posture: Secondary | ICD-10-CM | POA: Diagnosis not present

## 2021-11-24 DIAGNOSIS — R531 Weakness: Secondary | ICD-10-CM | POA: Diagnosis not present

## 2021-11-24 DIAGNOSIS — E118 Type 2 diabetes mellitus with unspecified complications: Secondary | ICD-10-CM | POA: Diagnosis not present

## 2021-11-24 DIAGNOSIS — R5383 Other fatigue: Secondary | ICD-10-CM | POA: Diagnosis not present

## 2021-11-24 DIAGNOSIS — E039 Hypothyroidism, unspecified: Secondary | ICD-10-CM | POA: Diagnosis not present

## 2021-11-24 DIAGNOSIS — K219 Gastro-esophageal reflux disease without esophagitis: Secondary | ICD-10-CM | POA: Diagnosis not present

## 2021-11-24 DIAGNOSIS — M542 Cervicalgia: Secondary | ICD-10-CM | POA: Diagnosis not present

## 2021-11-24 DIAGNOSIS — R293 Abnormal posture: Secondary | ICD-10-CM | POA: Diagnosis not present

## 2021-11-24 DIAGNOSIS — R350 Frequency of micturition: Secondary | ICD-10-CM | POA: Diagnosis not present

## 2021-11-24 DIAGNOSIS — I10 Essential (primary) hypertension: Secondary | ICD-10-CM | POA: Diagnosis not present

## 2021-11-25 ENCOUNTER — Telehealth: Payer: Self-pay | Admitting: Cardiovascular Disease

## 2021-11-25 DIAGNOSIS — M542 Cervicalgia: Secondary | ICD-10-CM | POA: Diagnosis not present

## 2021-11-25 DIAGNOSIS — R531 Weakness: Secondary | ICD-10-CM | POA: Diagnosis not present

## 2021-11-25 DIAGNOSIS — R293 Abnormal posture: Secondary | ICD-10-CM | POA: Diagnosis not present

## 2021-11-25 NOTE — Telephone Encounter (Signed)
   Pt's wife calling, she said pt was seen by Dr. Shelia Media and he saw something abnormal on pt's ekg and was told need to be seen by Dr. Gwenlyn Found or APP this week. She said Dr. Shelia Media sent the records for Dr. Gwenlyn Found to see

## 2021-11-25 NOTE — Telephone Encounter (Signed)
Spoke with patient's wife Patient saw PCP on 11/28 - did ECG and "saw something he didn't like" PCP told patient that he should see cardiologist  Wife said she is unsure how well the ECG was done  Advised will request records from PCP office so MD can review and decide about follow up  LM for medical records at Dr. Pennie Banter office to request ECG, office note from that day and any subsequent notes, and last labs be faxed to our office for MD to review

## 2021-11-27 DIAGNOSIS — R748 Abnormal levels of other serum enzymes: Secondary | ICD-10-CM | POA: Diagnosis not present

## 2021-11-27 DIAGNOSIS — N281 Cyst of kidney, acquired: Secondary | ICD-10-CM | POA: Diagnosis not present

## 2021-11-27 DIAGNOSIS — K8020 Calculus of gallbladder without cholecystitis without obstruction: Secondary | ICD-10-CM | POA: Diagnosis not present

## 2021-11-27 NOTE — Telephone Encounter (Signed)
Spoke with pt's wife (ok per [facility]) regarding EKG done at Dr. Pennie Banter office. EKG reviewed by Dr. Audie Box and compared with previous EKGs done in the last few months. Per Dr. Audie Box nothing on EKG to be concerned about at this time. Wife verbalizes understanding.

## 2021-11-27 NOTE — Telephone Encounter (Signed)
Received fax from Dr. Pennie Banter office.  Called wife, left message to call back.

## 2021-11-28 DIAGNOSIS — M542 Cervicalgia: Secondary | ICD-10-CM | POA: Diagnosis not present

## 2021-11-28 DIAGNOSIS — R293 Abnormal posture: Secondary | ICD-10-CM | POA: Diagnosis not present

## 2021-11-28 DIAGNOSIS — R531 Weakness: Secondary | ICD-10-CM | POA: Diagnosis not present

## 2021-12-10 DIAGNOSIS — J069 Acute upper respiratory infection, unspecified: Secondary | ICD-10-CM | POA: Diagnosis not present

## 2021-12-10 DIAGNOSIS — H9202 Otalgia, left ear: Secondary | ICD-10-CM | POA: Diagnosis not present

## 2021-12-10 DIAGNOSIS — H6122 Impacted cerumen, left ear: Secondary | ICD-10-CM | POA: Diagnosis not present

## 2022-01-05 DIAGNOSIS — Z951 Presence of aortocoronary bypass graft: Secondary | ICD-10-CM | POA: Diagnosis not present

## 2022-01-05 DIAGNOSIS — E118 Type 2 diabetes mellitus with unspecified complications: Secondary | ICD-10-CM | POA: Diagnosis not present

## 2022-01-05 DIAGNOSIS — I10 Essential (primary) hypertension: Secondary | ICD-10-CM | POA: Diagnosis not present

## 2022-01-05 DIAGNOSIS — E789 Disorder of lipoprotein metabolism, unspecified: Secondary | ICD-10-CM | POA: Diagnosis not present

## 2022-01-07 DIAGNOSIS — E7800 Pure hypercholesterolemia, unspecified: Secondary | ICD-10-CM | POA: Diagnosis not present

## 2022-01-07 DIAGNOSIS — E118 Type 2 diabetes mellitus with unspecified complications: Secondary | ICD-10-CM | POA: Diagnosis not present

## 2022-01-07 DIAGNOSIS — R748 Abnormal levels of other serum enzymes: Secondary | ICD-10-CM | POA: Diagnosis not present

## 2022-01-07 DIAGNOSIS — I10 Essential (primary) hypertension: Secondary | ICD-10-CM | POA: Diagnosis not present

## 2022-01-07 DIAGNOSIS — I2510 Atherosclerotic heart disease of native coronary artery without angina pectoris: Secondary | ICD-10-CM | POA: Diagnosis not present

## 2022-01-07 DIAGNOSIS — K219 Gastro-esophageal reflux disease without esophagitis: Secondary | ICD-10-CM | POA: Diagnosis not present

## 2022-01-22 DIAGNOSIS — E875 Hyperkalemia: Secondary | ICD-10-CM | POA: Diagnosis not present

## 2022-02-18 DIAGNOSIS — D0462 Carcinoma in situ of skin of left upper limb, including shoulder: Secondary | ICD-10-CM | POA: Diagnosis not present

## 2022-02-18 DIAGNOSIS — L570 Actinic keratosis: Secondary | ICD-10-CM | POA: Diagnosis not present

## 2022-04-08 DIAGNOSIS — E118 Type 2 diabetes mellitus with unspecified complications: Secondary | ICD-10-CM | POA: Diagnosis not present

## 2022-04-08 DIAGNOSIS — R001 Bradycardia, unspecified: Secondary | ICD-10-CM | POA: Diagnosis not present

## 2022-04-08 DIAGNOSIS — E7800 Pure hypercholesterolemia, unspecified: Secondary | ICD-10-CM | POA: Diagnosis not present

## 2022-04-08 DIAGNOSIS — Z7902 Long term (current) use of antithrombotics/antiplatelets: Secondary | ICD-10-CM | POA: Diagnosis not present

## 2022-04-08 DIAGNOSIS — Z951 Presence of aortocoronary bypass graft: Secondary | ICD-10-CM | POA: Diagnosis not present

## 2022-04-08 DIAGNOSIS — I10 Essential (primary) hypertension: Secondary | ICD-10-CM | POA: Diagnosis not present

## 2022-04-14 ENCOUNTER — Ambulatory Visit: Payer: Medicare Other | Admitting: Cardiovascular Disease

## 2022-04-14 ENCOUNTER — Encounter: Payer: Self-pay | Admitting: Cardiovascular Disease

## 2022-04-14 VITALS — BP 128/62 | HR 46 | Ht 67.0 in | Wt 200.6 lb

## 2022-04-14 DIAGNOSIS — G4733 Obstructive sleep apnea (adult) (pediatric): Secondary | ICD-10-CM

## 2022-04-14 DIAGNOSIS — I10 Essential (primary) hypertension: Secondary | ICD-10-CM

## 2022-04-14 DIAGNOSIS — R001 Bradycardia, unspecified: Secondary | ICD-10-CM | POA: Diagnosis not present

## 2022-04-14 DIAGNOSIS — I2510 Atherosclerotic heart disease of native coronary artery without angina pectoris: Secondary | ICD-10-CM

## 2022-04-14 DIAGNOSIS — E785 Hyperlipidemia, unspecified: Secondary | ICD-10-CM | POA: Diagnosis not present

## 2022-04-14 DIAGNOSIS — I739 Peripheral vascular disease, unspecified: Secondary | ICD-10-CM | POA: Diagnosis not present

## 2022-04-14 NOTE — Assessment & Plan Note (Signed)
History of dyslipidemia on Zetia and Crestor with lipid profile performed 09/01/2021 revealing total cholesterol 92, LDL 32 and HDL 21. ?

## 2022-04-14 NOTE — Progress Notes (Signed)
? ? ? ?04/14/2022 ?Roe   ?10/22/1936  ?[id number] ? ?Primary Physician Rick Pretty, MD ?Primary Cardiologist: Rick Harp MD Rick Melendez, [place] ? ?HPI:  Rick Melendez is a 86 y.o.  severely obese, married Caucasian male, father of 2, grandfather to 2 grandchildren who I last saw in the office 04/11/2021.  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 He doesn't do any regular exercise but he active around the farm with chores. He has a remote history tobacco abuse, having quit 20 years ago, as well as, treated hypertension and hyperlipidemia, and diabetes.  ?  ?He has CAD and had left main 2-vessel disease by cath August 31, 2006, and underwent coronary artery bypass grafting x2 by Dr. Ceasar Melendez with a LIMA to his LAD, a vein to an obtuse marginal branch and right coronary arter. He has also had stenting of his left leg back in 2003, doppler in July 2013 showed Rt iliac disease but he has been asymptomatic. He denies chest pain or shortness of breath. He does have daytime fatigue and this is unchanged. He has had some leg pain which he attributes to statin Rx. His last Myoview was performed 07/06/13 was nonischemic.[name] He sees Dr. [name] f for obstructive sleep apnea.  He is on CPAP supposedly but unfortunately is not wearing this. ? ? ?Since I saw him in the office a year ago he continues to do well.  He denies chest pain , shortness of breath or claudication. ? ? ?Current Meds  ?Medication Sig  ? aspirin EC 81 MG tablet Take 81 mg by mouth daily.  ? clopidogrel (PLAVIX) 75 MG tablet Take 75 mg by mouth daily.  ? ezetimibe (ZETIA) 10 MG tablet Take 10 mg by mouth daily.  ? FARXIGA 10 MG TABS tablet Take 10 mg by mouth daily.  ? furosemide (LASIX) 20 MG tablet Take 1 tablet by mouth daily.  ? gabapentin (NEURONTIN) 300 MG capsule Take 1 capsule by mouth at night for 7 nights and then 1 in  the morning and one at night for 7 days and then 3 times a day.  ? lisinopril (ZESTRIL) 5 MG tablet Take 5 mg by mouth daily.  ? metoprolol tartrate (LOPRESSOR) 25 MG tablet Take 12.5 mg by mouth daily.   ? omeprazole (PRILOSEC) 20 MG capsule Take 1 capsule by mouth daily.  ? ONETOUCH VERIO test strip U FOR GLUCOSE TESTING TID  ? OVER THE COUNTER MEDICATION USES CPAP NIGHTLY  ? rosuvastatin (CRESTOR) 10 MG tablet Take 10 mg by mouth at bedtime.  ? traMADol (ULTRAM) 50 MG tablet Take 50-100 mg by mouth 3 (three) times daily.  ? [DISCONTINUED] lisinopril (PRINIVIL,ZESTRIL) 20 MG tablet Take 20 mg by mouth daily.  ?  ? ?No Known Allergies ? ?Social History  ? ?Socioeconomic History  ? Marital status: Married  ?  Spouse name: Not on file  ? Number of children: Not on file  ? Years of education: Not on file  ? Highest education level: Not on file  ?Occupational History  ? Not on file  ?Tobacco Use  ? Smoking status: Former  ?  Types: Cigarettes  ?  Quit date: 09/03/1981  ?  Years since quitting: 40.6  ? Smokeless tobacco: Current  ?  Types: Chew  ?Substance and Sexual Activity  ? Alcohol use: Yes  ?  Drug use: No  ? Sexual activity: Not on file  ?Other Topics Concern  ? Not on file  ?Social History Narrative  ? Not on file  ? ?Social Determinants of Health  ? ?Financial Resource Strain: Not on file  ?Food Insecurity: Not on file  ?Transportation Needs: Not on file  ?Physical Activity: Not on file  ?Stress: Not on file  ?Social Connections: Not on file  ?Intimate Partner Violence: Not on file  ?  ? ?Review of Systems: ?General: negative for chills, fever, night sweats or weight changes.  ?Cardiovascular: negative for chest pain, dyspnea on exertion, edema, orthopnea, palpitations, paroxysmal nocturnal dyspnea or shortness of breath ?Dermatological: negative for rash ?Respiratory: negative for cough or wheezing ?Urologic: negative for hematuria ?Abdominal: negative for nausea, vomiting, diarrhea, bright red blood per rectum,  melena, or hematemesis ?Neurologic: negative for visual changes, syncope, or dizziness ?All other systems reviewed and are otherwise negative except as noted above. ? ? ? ?Blood pressure 128/62, pulse (!) 46, height '5\' 7"'$  (1.702 m), weight 200 lb 9.6 oz (91 kg), SpO2 94 %.  ?General appearance: alert and no distress ?Neck: no adenopathy, no carotid bruit, no JVD, supple, symmetrical, trachea midline, and thyroid not enlarged, symmetric, no tenderness/mass/nodules ?Lungs: clear to auscultation bilaterally ?Heart: regular rate and rhythm, S1, S2 normal, no murmur, click, rub or gallop ?Extremities: extremities normal, atraumatic, no cyanosis or edema ?Pulses: 2+ and symmetric ?Skin: Skin color, texture, turgor normal. No rashes or lesions ?Neurologic: Grossly normal ? ?EKG sinus bradycardia at 46 with first-degree AV block.  I personally reviewed this EKG. ? ?ASSESSMENT AND PLAN:  ? ?CAD - CABG X 2 9/07 LIMA-LAD, SVG-OM. Low risk Myoview 7/12 ?History of CAD status post coronary artery bypass grafting x3 by Dr. Servando Melendez after cath performed 08/31/2006 by Dr. Glade Melendez.  He had a LIMA to his LAD and a vein to an obtuse marginal branch and right coronary artery.  His last Myoview performed 07/06/2013 was nonischemic.  He denies chest pain or shortness of breath. ? ?HTN (hypertension) ?History of essential hypertension blood pressure measured today at 128/62.  He is on lisinopril and low-dose metoprolol. ? ?Dyslipidemia ?History of dyslipidemia on Zetia and Crestor with lipid profile performed 09/01/2021 revealing total cholesterol 92, LDL 32 and HDL 21. ? ?Sleep apnea- compliant with C-pap ?History of obstructive sleep apnea on CPAP.  He does see Dr. [name] for this.  Unfortunately, he is not wearing his CPAP currently. ? ?PVD (peripheral vascular disease)- Rt iliac disease by doppler 7/13 ?History of PAD status post right iliac stenting by Dr. Glade Melendez in 2003.  He denies claudication.  His most recent lower extremity  arterial Doppler studies performed 07/18/2021 revealed normal ABIs bilaterally with a patent iliac stent.  This will be repeated this coming July. ? ?Slow heart rate ?Heart rates have been in the mid 69s.  He is on low-dose metoprolol 12.5 mg a day.  He is asymptomatic from this.  At this point, I see no reason to discontinue his beta-blocker. ? ? ? ? ?Rick Harp MD FACP,FACC,FAHA, FSCAI ?04/14/2022 ?11:23 AM ?

## 2022-04-14 NOTE — Assessment & Plan Note (Signed)
History of CAD status post coronary artery bypass grafting x3 by Dr. Servando Snare after cath performed 08/31/2006 by Dr. Glade Lloyd.  He had a LIMA to his LAD and a vein to an obtuse marginal branch and right coronary artery.  His last Myoview performed 07/06/2013 was nonischemic.  He denies chest pain or shortness of breath. ?

## 2022-04-14 NOTE — Patient Instructions (Addendum)
Medication Instructions:  ?Your physician recommends that you continue on your current medications as directed. Please refer to the Current Medication list given to you today. ? ?*If you need a refill on your cardiac medications before your next appointment, please call your pharmacy* ? ? ?Testing/Procedures: ?Dr. Gwenlyn Found has recommended that you have an Ultrasound of your AORTA/IVC/ILIACS.  ? ?To prepare for this test: ? ?No food after 11PM the night before. Water is OK. (Don't drink liquids if you have been instructed not to for ANOTHER test).  ?Avoid foods that produce bowel gas, for 24 hours prior to exam (see below). ?No breakfast, no chewing gum, no smoking or carbonated beverages. ?Patient may take morning medications with water. ?Come in for test at least 15 minutes early to register. ? ?Your physician has requested that you have an ankle brachial index (ABI). During this test an ultrasound and blood pressure cuff are used to evaluate the arteries that supply the arms and legs with blood. Allow thirty minutes for this exam. There are no restrictions or special instructions. ?These procedures will be done in July. These procedures will be done at [place] ? ? ?Follow-Up: ?At [facility], you and your health needs are our priority.  As part of our continuing mission to provide you with exceptional heart care, we have created designated Provider Care Teams.  These Care Teams include your primary Cardiologist (physician) and Advanced Practice Providers (APPs -  Physician Assistants and Nurse Practitioners) who all work together to provide you with the care you need, when you need it. ? ?We recommend signing up for the patient portal called "MyChart".  Sign up information is provided on this After Visit Summary.  MyChart is used to connect with patients for Virtual Visits (Telemedicine).  Patients are able to view lab/test results, encounter notes, upcoming appointments, etc.  Non-urgent  messages can be sent to your provider as well.   ?To learn more about what you can do with MyChart, go to [email or web address].   ? ?Your next appointment:   ?12 month(s) ? ?The format for your next appointment:   ?In Person ? ?Provider:   ?Quay Burow, MD ?

## 2022-04-14 NOTE — Assessment & Plan Note (Signed)
History of obstructive sleep apnea on CPAP.  He does see Dr. Claiborne Billings for this.  Unfortunately, he is not wearing his CPAP currently. ?

## 2022-04-14 NOTE — Assessment & Plan Note (Signed)
History of PAD status post right iliac stenting by Dr. Glade Lloyd in 2003.  He denies claudication.  His most recent lower extremity arterial Doppler studies performed 07/18/2021 revealed normal ABIs bilaterally with a patent iliac stent.  This will be repeated this coming July. ?

## 2022-04-14 NOTE — Assessment & Plan Note (Signed)
History of essential hypertension blood pressure measured today at 128/62.  He is on lisinopril and low-dose metoprolol. ?

## 2022-04-14 NOTE — Assessment & Plan Note (Signed)
Heart rates have been in the mid 7s.  He is on low-dose metoprolol 12.5 mg a day.  He is asymptomatic from this.  At this point, I see no reason to discontinue his beta-blocker. ?

## 2022-06-26 ENCOUNTER — Encounter (HOSPITAL_COMMUNITY): Payer: Medicare Other

## 2022-06-26 ENCOUNTER — Ambulatory Visit (HOSPITAL_COMMUNITY)
Admission: RE | Admit: 2022-06-26 | Discharge: 2022-06-26 | Disposition: A | Discharge status: Home / Self Care | Payer: Medicare Other | Source: Ambulatory Visit | Attending: Cardiovascular Disease | Admitting: Cardiovascular Disease

## 2022-06-26 ENCOUNTER — Ambulatory Visit (HOSPITAL_BASED_OUTPATIENT_CLINIC_OR_DEPARTMENT_OTHER)
Admission: RE | Admit: 2022-06-26 | Discharge: 2022-06-26 | Disposition: A | Discharge status: Home / Self Care | Payer: Medicare Other | Source: Ambulatory Visit | Attending: Cardiovascular Disease | Admitting: Cardiovascular Disease

## 2022-06-26 DIAGNOSIS — I739 Peripheral vascular disease, unspecified: Secondary | ICD-10-CM

## 2022-06-26 DIAGNOSIS — Z95828 Presence of other vascular implants and grafts: Secondary | ICD-10-CM

## 2022-07-07 DIAGNOSIS — I10 Essential (primary) hypertension: Secondary | ICD-10-CM | POA: Diagnosis not present

## 2022-07-07 DIAGNOSIS — E118 Type 2 diabetes mellitus with unspecified complications: Secondary | ICD-10-CM | POA: Diagnosis not present

## 2022-07-07 DIAGNOSIS — Z951 Presence of aortocoronary bypass graft: Secondary | ICD-10-CM | POA: Diagnosis not present

## 2022-07-07 DIAGNOSIS — E7800 Pure hypercholesterolemia, unspecified: Secondary | ICD-10-CM | POA: Diagnosis not present

## 2022-07-07 DIAGNOSIS — E1169 Type 2 diabetes mellitus with other specified complication: Secondary | ICD-10-CM | POA: Diagnosis not present

## 2022-07-07 DIAGNOSIS — Z7902 Long term (current) use of antithrombotics/antiplatelets: Secondary | ICD-10-CM | POA: Diagnosis not present

## 2022-07-20 DIAGNOSIS — M25551 Pain in right hip: Secondary | ICD-10-CM | POA: Diagnosis not present

## 2022-07-20 DIAGNOSIS — W19XXXA Unspecified fall, initial encounter: Secondary | ICD-10-CM | POA: Diagnosis not present

## 2022-08-17 ENCOUNTER — Ambulatory Visit (INDEPENDENT_AMBULATORY_CARE_PROVIDER_SITE_OTHER): Payer: Medicare Other

## 2022-08-17 ENCOUNTER — Ambulatory Visit: Payer: Medicare Other | Admitting: Orthopaedic Surgery

## 2022-08-17 DIAGNOSIS — S7001XA Contusion of right hip, initial encounter: Secondary | ICD-10-CM | POA: Diagnosis not present

## 2022-08-17 DIAGNOSIS — M25551 Pain in right hip: Secondary | ICD-10-CM | POA: Diagnosis not present

## 2022-08-17 NOTE — Progress Notes (Signed)
The patient is an 86 year old gentleman who comes in for evaluation treatment of right hip pain.  He fell on 28 July landing directly on his right hip.  That was on a Friday.  By Monday x-rays were obtained by his primary care physician and no fracture was noted.  He is ambulating with a walker and he says his phone little bit better but he still cannot bear weight well on his right hip.  He points to the trochanteric area as a source of his pain.  I can easily put his right hip through internal and external rotation with minimal discomfort.  There is some mild pain over the trochanteric area of the right hip.  An AP pelvis lateral right hip shows no obvious fracture.  The hip joints are well located with no significant arthritic findings.  His wife let me know that he is scheduled to start physical therapy later this week and I think this reasonable since he is still on a walker and having problems with weightbearing.  Gave him reassurance that there was no fracture that I can see.  I still would like to see him back in 4 weeks because then we would consider a trochanteric injection if needed.  I would not provide 1 today since this still could be a stress fracture.  All questions and concerns were answered and addressed.

## 2022-08-20 DIAGNOSIS — M62552 Muscle wasting and atrophy, not elsewhere classified, left thigh: Secondary | ICD-10-CM | POA: Diagnosis not present

## 2022-08-20 DIAGNOSIS — R2681 Unsteadiness on feet: Secondary | ICD-10-CM | POA: Diagnosis not present

## 2022-08-20 DIAGNOSIS — M62551 Muscle wasting and atrophy, not elsewhere classified, right thigh: Secondary | ICD-10-CM | POA: Diagnosis not present

## 2022-08-20 DIAGNOSIS — M62561 Muscle wasting and atrophy, not elsewhere classified, right lower leg: Secondary | ICD-10-CM | POA: Diagnosis not present

## 2022-08-20 DIAGNOSIS — M25551 Pain in right hip: Secondary | ICD-10-CM | POA: Diagnosis not present

## 2022-08-25 DIAGNOSIS — C44319 Basal cell carcinoma of skin of other parts of face: Secondary | ICD-10-CM | POA: Diagnosis not present

## 2022-08-25 DIAGNOSIS — L570 Actinic keratosis: Secondary | ICD-10-CM | POA: Diagnosis not present

## 2022-08-28 DIAGNOSIS — R2681 Unsteadiness on feet: Secondary | ICD-10-CM | POA: Diagnosis not present

## 2022-08-28 DIAGNOSIS — M25551 Pain in right hip: Secondary | ICD-10-CM | POA: Diagnosis not present

## 2022-08-28 DIAGNOSIS — M62551 Muscle wasting and atrophy, not elsewhere classified, right thigh: Secondary | ICD-10-CM | POA: Diagnosis not present

## 2022-08-28 DIAGNOSIS — M62552 Muscle wasting and atrophy, not elsewhere classified, left thigh: Secondary | ICD-10-CM | POA: Diagnosis not present

## 2022-08-28 DIAGNOSIS — M62561 Muscle wasting and atrophy, not elsewhere classified, right lower leg: Secondary | ICD-10-CM | POA: Diagnosis not present

## 2022-08-31 DIAGNOSIS — E118 Type 2 diabetes mellitus with unspecified complications: Secondary | ICD-10-CM | POA: Diagnosis not present

## 2022-08-31 DIAGNOSIS — I10 Essential (primary) hypertension: Secondary | ICD-10-CM | POA: Diagnosis not present

## 2022-08-31 DIAGNOSIS — R413 Other amnesia: Secondary | ICD-10-CM | POA: Diagnosis not present

## 2022-08-31 DIAGNOSIS — E039 Hypothyroidism, unspecified: Secondary | ICD-10-CM | POA: Diagnosis not present

## 2022-09-01 DIAGNOSIS — M62552 Muscle wasting and atrophy, not elsewhere classified, left thigh: Secondary | ICD-10-CM | POA: Diagnosis not present

## 2022-09-01 DIAGNOSIS — M62561 Muscle wasting and atrophy, not elsewhere classified, right lower leg: Secondary | ICD-10-CM | POA: Diagnosis not present

## 2022-09-01 DIAGNOSIS — M62551 Muscle wasting and atrophy, not elsewhere classified, right thigh: Secondary | ICD-10-CM | POA: Diagnosis not present

## 2022-09-01 DIAGNOSIS — M25551 Pain in right hip: Secondary | ICD-10-CM | POA: Diagnosis not present

## 2022-09-01 DIAGNOSIS — R2681 Unsteadiness on feet: Secondary | ICD-10-CM | POA: Diagnosis not present

## 2022-09-03 DIAGNOSIS — I2510 Atherosclerotic heart disease of native coronary artery without angina pectoris: Secondary | ICD-10-CM | POA: Diagnosis not present

## 2022-09-03 DIAGNOSIS — E778 Other disorders of glycoprotein metabolism: Secondary | ICD-10-CM | POA: Diagnosis not present

## 2022-09-03 DIAGNOSIS — R0982 Postnasal drip: Secondary | ICD-10-CM | POA: Diagnosis not present

## 2022-09-03 DIAGNOSIS — E039 Hypothyroidism, unspecified: Secondary | ICD-10-CM | POA: Diagnosis not present

## 2022-09-03 DIAGNOSIS — R413 Other amnesia: Secondary | ICD-10-CM | POA: Diagnosis not present

## 2022-09-03 DIAGNOSIS — D692 Other nonthrombocytopenic purpura: Secondary | ICD-10-CM | POA: Diagnosis not present

## 2022-09-03 DIAGNOSIS — I10 Essential (primary) hypertension: Secondary | ICD-10-CM | POA: Diagnosis not present

## 2022-09-03 DIAGNOSIS — R052 Subacute cough: Secondary | ICD-10-CM | POA: Diagnosis not present

## 2022-09-03 DIAGNOSIS — Z0000 Encounter for general adult medical examination without abnormal findings: Secondary | ICD-10-CM | POA: Diagnosis not present

## 2022-09-03 DIAGNOSIS — K219 Gastro-esophageal reflux disease without esophagitis: Secondary | ICD-10-CM | POA: Diagnosis not present

## 2022-09-03 DIAGNOSIS — E118 Type 2 diabetes mellitus with unspecified complications: Secondary | ICD-10-CM | POA: Diagnosis not present

## 2022-09-04 DIAGNOSIS — M62561 Muscle wasting and atrophy, not elsewhere classified, right lower leg: Secondary | ICD-10-CM | POA: Diagnosis not present

## 2022-09-04 DIAGNOSIS — M25551 Pain in right hip: Secondary | ICD-10-CM | POA: Diagnosis not present

## 2022-09-04 DIAGNOSIS — M62551 Muscle wasting and atrophy, not elsewhere classified, right thigh: Secondary | ICD-10-CM | POA: Diagnosis not present

## 2022-09-04 DIAGNOSIS — R2681 Unsteadiness on feet: Secondary | ICD-10-CM | POA: Diagnosis not present

## 2022-09-04 DIAGNOSIS — M62552 Muscle wasting and atrophy, not elsewhere classified, left thigh: Secondary | ICD-10-CM | POA: Diagnosis not present

## 2022-09-08 DIAGNOSIS — M62561 Muscle wasting and atrophy, not elsewhere classified, right lower leg: Secondary | ICD-10-CM | POA: Diagnosis not present

## 2022-09-08 DIAGNOSIS — M62551 Muscle wasting and atrophy, not elsewhere classified, right thigh: Secondary | ICD-10-CM | POA: Diagnosis not present

## 2022-09-08 DIAGNOSIS — M25551 Pain in right hip: Secondary | ICD-10-CM | POA: Diagnosis not present

## 2022-09-08 DIAGNOSIS — M62552 Muscle wasting and atrophy, not elsewhere classified, left thigh: Secondary | ICD-10-CM | POA: Diagnosis not present

## 2022-09-08 DIAGNOSIS — R2681 Unsteadiness on feet: Secondary | ICD-10-CM | POA: Diagnosis not present

## 2022-09-10 DIAGNOSIS — M62551 Muscle wasting and atrophy, not elsewhere classified, right thigh: Secondary | ICD-10-CM | POA: Diagnosis not present

## 2022-09-10 DIAGNOSIS — M62561 Muscle wasting and atrophy, not elsewhere classified, right lower leg: Secondary | ICD-10-CM | POA: Diagnosis not present

## 2022-09-10 DIAGNOSIS — M25551 Pain in right hip: Secondary | ICD-10-CM | POA: Diagnosis not present

## 2022-09-10 DIAGNOSIS — R2681 Unsteadiness on feet: Secondary | ICD-10-CM | POA: Diagnosis not present

## 2022-09-10 DIAGNOSIS — M62552 Muscle wasting and atrophy, not elsewhere classified, left thigh: Secondary | ICD-10-CM | POA: Diagnosis not present

## 2022-09-14 ENCOUNTER — Ambulatory Visit: Payer: Medicare Other | Admitting: Orthopaedic Surgery

## 2022-09-14 DIAGNOSIS — S7001XA Contusion of right hip, initial encounter: Secondary | ICD-10-CM

## 2022-09-14 DIAGNOSIS — M25551 Pain in right hip: Secondary | ICD-10-CM | POA: Diagnosis not present

## 2022-09-14 NOTE — Progress Notes (Signed)
The patient is an 86 year old gentleman we wanted to see back after mechanical fall in which he injured his right hip.  We saw him a month ago he was ambulate with a walker and x-rays were negative for fracture.  He has had physical therapy and is doing much better.  He is transition to a cane.  He is only using a topical medication that has helped.  He says he does not think he is at the point of pain where he needs any type of steroid injection.  His right hip moves smoothly and fluidly with some mild pain of the trochanteric area but is only mild.  He is ambulating much better with a cane.  He does have known arthritis in his knees and sees Dr. Sharol Given for this.  From a standpoint since he is doing so well follow-up can be as needed.  If he does get to the point where he would like to consider any type of injection he should not hesitate to call us.  All questions and concerns were answered and addressed.

## 2022-09-15 DIAGNOSIS — M25551 Pain in right hip: Secondary | ICD-10-CM | POA: Diagnosis not present

## 2022-09-15 DIAGNOSIS — R2681 Unsteadiness on feet: Secondary | ICD-10-CM | POA: Diagnosis not present

## 2022-09-15 DIAGNOSIS — M62552 Muscle wasting and atrophy, not elsewhere classified, left thigh: Secondary | ICD-10-CM | POA: Diagnosis not present

## 2022-09-15 DIAGNOSIS — M62561 Muscle wasting and atrophy, not elsewhere classified, right lower leg: Secondary | ICD-10-CM | POA: Diagnosis not present

## 2022-09-15 DIAGNOSIS — M62551 Muscle wasting and atrophy, not elsewhere classified, right thigh: Secondary | ICD-10-CM | POA: Diagnosis not present

## 2022-09-18 DIAGNOSIS — M62561 Muscle wasting and atrophy, not elsewhere classified, right lower leg: Secondary | ICD-10-CM | POA: Diagnosis not present

## 2022-09-18 DIAGNOSIS — M25551 Pain in right hip: Secondary | ICD-10-CM | POA: Diagnosis not present

## 2022-09-18 DIAGNOSIS — M62552 Muscle wasting and atrophy, not elsewhere classified, left thigh: Secondary | ICD-10-CM | POA: Diagnosis not present

## 2022-09-18 DIAGNOSIS — R2681 Unsteadiness on feet: Secondary | ICD-10-CM | POA: Diagnosis not present

## 2022-09-18 DIAGNOSIS — M62551 Muscle wasting and atrophy, not elsewhere classified, right thigh: Secondary | ICD-10-CM | POA: Diagnosis not present

## 2022-09-21 ENCOUNTER — Encounter: Payer: Self-pay | Admitting: Physician Assistant

## 2022-09-22 DIAGNOSIS — M62551 Muscle wasting and atrophy, not elsewhere classified, right thigh: Secondary | ICD-10-CM | POA: Diagnosis not present

## 2022-09-22 DIAGNOSIS — M62561 Muscle wasting and atrophy, not elsewhere classified, right lower leg: Secondary | ICD-10-CM | POA: Diagnosis not present

## 2022-09-22 DIAGNOSIS — M25551 Pain in right hip: Secondary | ICD-10-CM | POA: Diagnosis not present

## 2022-09-22 DIAGNOSIS — R2681 Unsteadiness on feet: Secondary | ICD-10-CM | POA: Diagnosis not present

## 2022-09-22 DIAGNOSIS — M62552 Muscle wasting and atrophy, not elsewhere classified, left thigh: Secondary | ICD-10-CM | POA: Diagnosis not present

## 2022-09-24 DIAGNOSIS — M25551 Pain in right hip: Secondary | ICD-10-CM | POA: Diagnosis not present

## 2022-09-24 DIAGNOSIS — M62552 Muscle wasting and atrophy, not elsewhere classified, left thigh: Secondary | ICD-10-CM | POA: Diagnosis not present

## 2022-09-24 DIAGNOSIS — M62551 Muscle wasting and atrophy, not elsewhere classified, right thigh: Secondary | ICD-10-CM | POA: Diagnosis not present

## 2022-09-24 DIAGNOSIS — M62561 Muscle wasting and atrophy, not elsewhere classified, right lower leg: Secondary | ICD-10-CM | POA: Diagnosis not present

## 2022-09-24 DIAGNOSIS — R2681 Unsteadiness on feet: Secondary | ICD-10-CM | POA: Diagnosis not present

## 2022-09-25 DIAGNOSIS — Z23 Encounter for immunization: Secondary | ICD-10-CM | POA: Diagnosis not present

## 2022-09-29 DIAGNOSIS — Z135 Encounter for screening for eye and ear disorders: Secondary | ICD-10-CM | POA: Diagnosis not present

## 2022-09-29 DIAGNOSIS — R413 Other amnesia: Secondary | ICD-10-CM | POA: Diagnosis not present

## 2022-09-30 DIAGNOSIS — M62561 Muscle wasting and atrophy, not elsewhere classified, right lower leg: Secondary | ICD-10-CM | POA: Diagnosis not present

## 2022-09-30 DIAGNOSIS — R2681 Unsteadiness on feet: Secondary | ICD-10-CM | POA: Diagnosis not present

## 2022-09-30 DIAGNOSIS — M62552 Muscle wasting and atrophy, not elsewhere classified, left thigh: Secondary | ICD-10-CM | POA: Diagnosis not present

## 2022-09-30 DIAGNOSIS — M25551 Pain in right hip: Secondary | ICD-10-CM | POA: Diagnosis not present

## 2022-09-30 DIAGNOSIS — M62551 Muscle wasting and atrophy, not elsewhere classified, right thigh: Secondary | ICD-10-CM | POA: Diagnosis not present

## 2022-10-01 ENCOUNTER — Telehealth: Payer: Self-pay | Admitting: Physical Medicine and Rehabilitation

## 2022-10-01 ENCOUNTER — Ambulatory Visit (INDEPENDENT_AMBULATORY_CARE_PROVIDER_SITE_OTHER): Payer: Medicare Other | Admitting: Orthopedic Surgery

## 2022-10-01 DIAGNOSIS — M170 Bilateral primary osteoarthritis of knee: Secondary | ICD-10-CM | POA: Diagnosis not present

## 2022-10-01 NOTE — Telephone Encounter (Signed)
Patient was here today. Wife says he needs an appointment with Dr. Ernestina Patches. Number is [phone]

## 2022-10-01 NOTE — Telephone Encounter (Signed)
Pt's wife returned call to set an appt for her husband. Please call Sarah back at [phone].

## 2022-10-01 NOTE — Telephone Encounter (Signed)
Tried calling to schedule. Additional information needed. Need clarification if this is for hip/back. LMVM to call back with additional details.

## 2022-10-02 ENCOUNTER — Telehealth: Payer: Self-pay | Admitting: Physician Assistant

## 2022-10-02 ENCOUNTER — Other Ambulatory Visit (INDEPENDENT_AMBULATORY_CARE_PROVIDER_SITE_OTHER): Payer: Medicare Other

## 2022-10-02 ENCOUNTER — Ambulatory Visit: Payer: Medicare Other | Admitting: Physician Assistant

## 2022-10-02 ENCOUNTER — Encounter: Payer: Self-pay | Admitting: Physician Assistant

## 2022-10-02 ENCOUNTER — Encounter: Payer: Self-pay | Admitting: Orthopedic Surgery

## 2022-10-02 VITALS — BP 158/67 | HR 52 | Resp 18 | Wt 198.0 lb

## 2022-10-02 DIAGNOSIS — G3184 Mild cognitive impairment, so stated: Secondary | ICD-10-CM

## 2022-10-02 DIAGNOSIS — M170 Bilateral primary osteoarthritis of knee: Secondary | ICD-10-CM | POA: Diagnosis not present

## 2022-10-02 LAB — TSH: TSH: 1.93 u[IU]/mL (ref 0.35–5.50)

## 2022-10-02 MED ORDER — MEMANTINE HCL 5 MG PO TABS
ORAL_TABLET | ORAL | 11 refills | Status: DC
Start: 2022-10-02 — End: 2023-07-16

## 2022-10-02 NOTE — Telephone Encounter (Signed)
Patient wife states that someone from our office called the patient she thinks it maybe about the blood work results  please call

## 2022-10-02 NOTE — Progress Notes (Signed)
Office Visit Note   Patient: Rick Melendez           Date of Birth: [birth year]           MRN: [id number] Visit Date: 10/01/2022              Requested by: Deland Pretty, MD [address] [place],  [place] [address] PCP: Deland Pretty, MD  Chief Complaint  Patient presents with   Right Knee - Follow-up   Left Knee - Follow-up      HPI: Patient is an 86 year old gentleman with osteoarthritis bilateral knees.  Patient underwent injection in February of last year.  Assessment & Plan: Visit Diagnoses:  1. Bilateral primary osteoarthritis of knee     Plan: Reevaluate in 3 months.  Follow-Up Instructions: Return in about 3 months (around 01/01/2023).   Ortho Exam  Patient is alert, oriented, no adenopathy, well-dressed, normal affect, normal respiratory effort. Examination patient has no redness or swelling of either knee.  There is crepitation with range of motion there is a mild effusion collaterals and cruciates are stable bilaterally.  Imaging: No results found. No images are attached to the encounter.  Labs: Lab Results  Component Value Date   REPTSTATUS 03/24/2017 FINAL 03/19/2017   CULT NO GROWTH 5 DAYS 03/19/2017     No results found for: "ALBUMIN", "PREALBUMIN", "CBC"  Lab Results  Component Value Date   MG 1.5 (L) 03/19/2017   No results found for: "VD25OH"  No results found for: "PREALBUMIN"    Latest Ref Rng & Units 03/20/2017    4:43 AM 03/19/2017   12:47 PM  CBC EXTENDED  WBC 4.0 - 10.5 K/uL 8.4  10.9   RBC 4.22 - 5.81 MIL/uL 4.90  5.54   Hemoglobin 13.0 - 17.0 g/dL 12.4  14.3   HCT 39.0 - 52.0 % 40.3  45.6   Platelets 150 - 400 K/uL 146  177   NEUT# 1.7 - 7.7 K/uL  7.5   Lymph# 0.7 - 4.0 K/uL  1.7      There is no height or weight on file to calculate BMI.  Orders:  No orders of the defined types were placed in this encounter.  No orders of the defined types were placed in this encounter.    Procedures: Large Joint  Inj: bilateral knee on 10/02/2022 4:23 PM Indications: pain and diagnostic evaluation Details: 22 G 1.5 in needle, anteromedial approach  Arthrogram: No  Outcome: tolerated well, no immediate complications Procedure, treatment alternatives, risks and benefits explained, specific risks discussed. Consent was given by the patient. Immediately prior to procedure a time out was called to verify the correct patient, procedure, equipment, support staff and site/side marked as required. Patient was prepped and draped in the usual sterile fashion.      Clinical Data: No additional findings.  ROS:  All other systems negative, except as noted in the HPI. Review of Systems  Objective: Vital Signs: There were no vitals taken for this visit.  Specialty Comments:  No specialty comments available.  PMFS History: Patient Active Problem List   Diagnosis Date Noted   Slow heart rate 04/14/2022   Influenza B    AKI (acute kidney injury) ([place])    CAP (community acquired pneumonia) 03/19/2017   Bilateral primary osteoarthritis of knee 01/28/2017   Impingement syndrome of right shoulder 01/28/2017   CAD - CABG X 2 9/07 LIMA-LAD, SVG-OM. Low risk Myoview 7/12 09/07/2013   HTN (hypertension) 09/07/2013  Dyslipidemia 09/07/2013   Diabetes mellitus ([place]) 09/07/2013   Chronic renal insufficiency, stage III (moderate)- SCr 1.6 (Aug 2013) 09/07/2013   Sleep apnea- compliant with C-pap 09/07/2013   PVD (peripheral vascular disease)- Rt iliac disease by doppler 7/13 09/07/2013   Past Medical History:  Diagnosis Date   Arthritis    Coronary artery disease 2007   cabg x3   Diabetes mellitus without complication ([place])    Hyperlipidemia    Hypertension    Influenza B 03/19/2017   OSA on CPAP    Dr Claiborne Billings - follows and treatmentof sleep apnea   PVD (peripheral vascular disease) ([place]) 2003   DR BERRY-  -LEFT LEG STENTING    Family History  Problem Relation Age of Onset   Cancer Father     Cancer Brother     Past Surgical History:  Procedure Laterality Date   CARDIAC CATHETERIZATION  0911/2007   3 vessel CAD WITH LEFT MAIN CAD SEVERE ,norm lLIMA,LV nom. ,evidence for very mild aortic stenosis with gradient, mild stenosis distal abdnminal aotra and proximal left common iliac artery 30%   CORONARY ARTERY BYPASS GRAFT  08/31/2006   DR GERHARDT---LIMA to LAD, VEIN TO AN OBTUSE MARGINAL BRANCH AND  RIGHT CORONARY ARTERY   CPET  04/13/2012   normal   DOPPLER ECHOCARDIOGRAPHY  07/08/2011   EF =>55%   lower arterial doppler  07/01/2012   mildly abnormal lower extremity;ABIs >1 bilaterally with moderately high-grade right iliac stenosis that had not changed from proir study.   NM MYOCAR PERF WALL MOTION  7/182012   EF 59%, normal myocardial perfusio   Social History   Occupational History   Not on file  Tobacco Use   Smoking status: Former    Types: Cigarettes    Quit date: 09/03/1981    Years since quitting: 41.1   Smokeless tobacco: Current    Types: Chew  Substance and Sexual Activity   Alcohol use: Yes   Drug use: No   Sexual activity: Not on file

## 2022-10-02 NOTE — Progress Notes (Cosign Needed)
Assessment/Plan:    The patient is seen in neurologic consultation at the request of Deland Pretty, MD for the evaluation of memory.  Rick Melendez is a  delightful 86 y.o. year old RH male with a history of hypertension, hyperlipidemia,  DM2, osteoarthritis with multiple surgeries, mild anemia, HOH, OSA not on CPA after weight loss, CAD, bradycardia, hypoproteinemia seen today for evaluation of memory loss. MoCA today is 20 /30 .He had a recent MRI brain at [facility] on 10/3 remarkable for mild global atrophy without any other acute abnormalities. Disc has been requested for review .   Mild Cognitive Impairment likely due to Alzheimer's disease   Obtain the results of the [facility]) MRI brain without contrast to assess for underlying structural abnormality and assess vascular load  Check  TSH Recommend hearing test  Recommend good control of cardiovascular risk factors.   Continue to control mood as per PCP  Start Memantine 5 mg: Take 1 tablet (5 mg at night) for 2 weeks, then increase to 1 tablet (5 mg) twice a day, side effects discussed  Hold omeprazole and discuss with PCP an alternative due to link of the medicine to dementia  Folllow up  in 1 month  Subjective:    The patient is accompanied by wife who supplements the history.    How long did patient have memory difficulties? "I didn't notice anything".  Wife "is not sure that he has significant memory changes, the doctor sent me here".  Disoriented when walking into a room?  Patient denies   Leaving objects in unusual places?  Patient denies   Patient lives wife Ambulates  with difficulty? Uses a walker and a cane for stability due to arthritis  Recent falls?  Fell at the end of July and uses a walker. ONnPT/OT weekly  Any head injuries?  Patient denies   History of seizures?   Patient denies   Wandering behavior?  Patient denies   Patient drives? No issues driving  Any mood changes ?  Denies  Any depression?:   Patient denies  "I like me too good!" Hallucinations?  Patient denies now, but when" I was on Ozempic he did, once off I was fine". Paranoia?  Patient denies   Patient reports that sleeps well without vivid dreams, REM behavior or sleepwalking    History of sleep apnea? Endorsed, but after 40 lb weight loss "I no longer need CPAP"" Any hygiene concerns?  Patient denies   Independent of bathing and dressing?  Endorsed  Does the patient needs help with medications? He is in charge  Who is in charge of the finances? Wife has always been is in charge   Any changes in appetite?  Patient denies   Patient have trouble swallowing? Patient denies   Does the patient cook?  Patient denies   Any kitchen accidents such as leaving the stove on? Patient denies   Any headaches?  Patient denies   Double vision? Patient denies   Any focal numbness or tingling?  Patient denies   Chronic back pain: Endorsed, s/p falls and several surgeries shoulder, C spine Unilateral weakness?  Patient denies   Any tremors?  Patient denies   Any history of anosmia?  Patient denies   Any incontinence of urine?  Patient denies   Any bowel dysfunction?   Patient denies   History of heavy alcohol intake?  Patient denies   History of heavy tobacco use?  Patient denies   Family history  of dementia?   Denies   Retired plumber  MRI brain report 09/22/22 There is mild diffuse cerebral atrophy. No hippocampal atrophy.     No Known Allergies  Current Outpatient Medications  Medication Instructions   aspirin EC 81 mg, Oral, Daily   clopidogrel (PLAVIX) 75 mg, Oral, Daily   CoQ10 200 mg, Oral, Daily   ezetimibe (ZETIA) 10 mg, Oral, Daily   Farxiga 10 mg, Oral, Daily   furosemide (LASIX) 20 MG tablet 1 tablet, Oral, Daily   gabapentin (NEURONTIN) 300 MG capsule Take 1 capsule by mouth at night for 7 nights and then 1 in the morning and one at night for 7 days and then 3 times a day.   glipiZIDE (GLUCOTROL) 5 mg, Daily    HYDROcodone-acetaminophen (NORCO/VICODIN) 5-325 MG per tablet 1 tablet, 3 times daily   lisinopril (ZESTRIL) 5 mg, Oral, Daily   metoprolol tartrate (LOPRESSOR) 12.5 mg, Oral, Daily   Omega-3 Fish Oil 2,400 mg, 2 times daily   omeprazole (PRILOSEC) 20 MG capsule 1 capsule, Oral, Daily   ONETOUCH VERIO test strip U FOR GLUCOSE TESTING TID   OVER THE COUNTER MEDICATION USES CPAP NIGHTLY    OZEMPIC, 0.25 OR 0.5 MG/DOSE, 2 MG/1.5ML SOPN SMARTSIG:0.5 SUB-Q Once a Week   rosuvastatin (CRESTOR) 10 mg, Oral, Daily at bedtime   traMADol (ULTRAM) 50-100 mg, Oral, 3 times daily     VITALS:   Vitals:   10/02/22 0952  BP: (!) 158/67  Pulse: (!) 52  Resp: 18  SpO2: 96%  Weight: 198 lb (89.8 kg)       No data to display          PHYSICAL EXAM   HEENT:  Normocephalic, atraumatic. The mucous membranes are moist. The superficial temporal arteries are without ropiness or tenderness. Cardiovascular: Regular rate and rhythm. Lungs: Clear to auscultation bilaterally. Neck: There are no carotid bruits noted bilaterally.  NEUROLOGICAL:    10/02/2022   10:00 AM  Montreal Cognitive Assessment   Visuospatial/ Executive (0/5) 1  Naming (0/3) 3  Attention: Read list of digits (0/2) 2  Attention: Read list of letters (0/1) 1  Attention: Serial 7 subtraction starting at 100 (0/3) 3  Language: Repeat phrase (0/2) 1  Language : Fluency (0/1) 0  Abstraction (0/2) 0  Delayed Recall (0/5) 4  Orientation (0/6) 5  Total 20  Adjusted Score (based on education) 20        No data to display           Orientation:  Alert and oriented to person, place and time. No aphasia or dysarthria. Fund of knowledge is appropriate. Recent memory impaired and remote memory intact.  Attention and concentration are normal.  Able to name objects and repeat phrases 1/2 . Delayed recall  4/5 Cranial nerves: There is good facial symmetry. Extraocular muscles are intact and visual fields are full to confrontational  testing. Speech is fluent and clear. Soft palate rises symmetrically and there is no tongue deviation. Hearing is intact to conversational tone. Tone: Tone is good throughout. Sensation: Sensation is intact to light touch and pinprick throughout. Vibration is intact at the bilateral big toe.There is no extinction with double simultaneous stimulation. There is no sensory dermatomal level identified. Coordination: The patient has no difficulty with RAM's or FNF bilaterally. Normal finger to nose  Motor: Strength is 5/5 in the bilateral upper and lower extremities. There is no pronator drift. There are no fasciculations noted. DTR's: Deep tendon reflexes are  2/4 at the bilateral biceps, triceps, brachioradialis, patella and achilles.  Plantar responses are downgoing bilaterally. Gait and Station: The patient is able to ambulate with a cane.The patient is able to heel toe walk without any difficulty, able to ambulate in a tandem fashion, able to stand in the Romberg position.     Thank you for allowing [place] the opportunity to participate in the care of this nice patient. Please do not hesitate to contact us for any questions or concerns.   Total time spent on today's visit was 53 minutes dedicated to this patient today, preparing to see patient, examining the patient, ordering tests and/or medications and counseling the patient, documenting clinical information in the EHR or other health record, independently interpreting results and communicating results to the patient/family, discussing treatment and goals, answering patient's questions and coordinating care.  Cc:  Deland Pretty, MD  Sharene Butters 10/02/2022 10:36 AM

## 2022-10-02 NOTE — Progress Notes (Signed)
Thyroid levels are normal, thanks

## 2022-10-02 NOTE — Patient Instructions (Addendum)
It was a pleasure to see you today at our office.   Recommendations:  Start  Memantine  5 mg Take 1 tablet (5 mg at night) for 2 weeks, then increase to 1 tablet (5 mg) twice a day   Check labs today Follow up in 3 months  Recommend hearing test Talk to your doctor about omeprazole   Whom to call:  Memory  decline, memory medications: Call our office [phone]   For psychiatric meds, mood meds: Please have your primary care physician manage these medications.   Counseling regarding caregiver distress, including caregiver depression, anxiety and issues regarding community resources, adult day care programs, adult living facilities, or memory care questions:   Feel free to contact [place], Social Worker at [phone]   For assessment of decision of mental capacity and competency:  Call Dr. Anthoney Harada, geriatric psychiatrist at [phone]  For guidance in geriatric dementia issues please call [facility] [phone]  For guidance regarding [facility] and if placement were needed at the facility, contact Arnell Asal, Social Worker tel: [phone]  If you have any severe symptoms of a stroke, or other severe issues such as confusion,severe chills or fever, etc call 911 or go to the ER as you may need to be evaluated further   Feel free to visit Facebook page " [employer]" for tips of how to care for people with memory problems.   Feel free to go to the following database for funded clinical studies conducted around the world: http://saunders.com/   https://www.triadclinicaltrials.com/     RECOMMENDATIONS FOR ALL PATIENTS WITH MEMORY PROBLEMS: 1. Continue to exercise (Recommend 30 minutes of walking everyday, or 3 hours every week) 2. Increase social interactions - continue going to [place] and enjoy social gatherings with friends and family 3. Eat healthy, avoid fried foods and eat more fruits and vegetables 4. Maintain  adequate blood pressure, blood sugar, and blood cholesterol level. Reducing the risk of stroke and cardiovascular disease also helps promoting better memory. 5. Avoid stressful situations. Live a simple life and avoid aggravations. Organize your time and prepare for the next day in anticipation. 6. Sleep well, avoid any interruptions of sleep and avoid any distractions in the bedroom that may interfere with adequate sleep quality 7. Avoid sugar, avoid sweets as there is a strong link between excessive sugar intake, diabetes, and cognitive impairment We discussed the Mediterranean diet, which has been shown to help patients reduce the risk of progressive memory disorders and reduces cardiovascular risk. This includes eating fish, eat fruits and green leafy vegetables, nuts like almonds and hazelnuts, walnuts, and also use olive oil. Avoid fast foods and fried foods as much as possible. Avoid sweets and sugar as sugar use has been linked to worsening of memory function.  There is always a concern of gradual progression of memory problems. If this is the case, then we may need to adjust level of care according to patient needs. Support, both to the patient and caregiver, should then be put into place.      You have been referred for a neuropsychological evaluation (i.e., evaluation of memory and thinking abilities). Please bring someone with you to this appointment if possible, as it is helpful for the doctor to hear from both you and another adult who knows you well. Please bring eyeglasses and hearing aids if you wear them.    The evaluation will take approximately 3 hours and has two parts:   The first part  is a clinical interview with the neuropsychologist (Dr. Melvyn Novas or Dr. Nicole Kindred). During the interview, the neuropsychologist will speak with you and the individual you brought to the appointment.    The second part of the evaluation is testing with the doctor's technician Hinton Dyer or Maudie Mercury). During the  testing, the technician will ask you to remember different types of material, solve problems, and answer some questionnaires. Your family member will not be present for this portion of the evaluation.   Please note: We must reserve several hours of the neuropsychologist's time and the psychometrician's time for your evaluation appointment. As such, there is a No-Show fee of $100. If you are unable to attend any of your appointments, please contact our office as soon as possible to reschedule.    FALL PRECAUTIONS: Be cautious when walking. Scan the area for obstacles that may increase the risk of trips and falls. When getting up in the mornings, sit up at the edge of the bed for a few minutes before getting out of bed. Consider elevating the bed at the head end to avoid drop of blood pressure when getting up. Walk always in a well-lit room (use night lights in the walls). Avoid area rugs or power cords from appliances in the middle of the walkways. Use a walker or a cane if necessary and consider physical therapy for balance exercise. Get your eyesight checked regularly.  FINANCIAL OVERSIGHT: Supervision, especially oversight when making financial decisions or transactions is also recommended.  HOME SAFETY: Consider the safety of the kitchen when operating appliances like stoves, microwave oven, and blender. Consider having supervision and share cooking responsibilities until no longer able to participate in those. Accidents with firearms and other hazards in the house should be identified and addressed as well.   ABILITY TO BE LEFT ALONE: If patient is unable to contact 911 operator, consider using LifeLine, or when the need is there, arrange for someone to stay with patients. Smoking is a fire hazard, consider supervision or cessation. Risk of wandering should be assessed by caregiver and if detected at any point, supervision and safe proof recommendations should be instituted.  MEDICATION  SUPERVISION: Inability to self-administer medication needs to be constantly addressed. Implement a mechanism to ensure safe administration of the medications.   DRIVING: Regarding driving, in patients with progressive memory problems, driving will be impaired. We advise to have someone else do the driving if trouble finding directions or if minor accidents are reported. Independent driving assessment is available to determine safety of driving.   If you are interested in the driving assessment, you can contact the following:  [employer] in [place]  [place] [phone] or [phone]    [place] refers to food and lifestyle choices that are based on the traditions of countries located on the [employer]. This way of eating has been shown to help prevent certain conditions and improve outcomes for people who have chronic diseases, like kidney disease and heart disease. What are tips for following this plan? Lifestyle  Cook and eat meals together with your family, when possible. Drink enough fluid to keep your urine clear or pale yellow. Be physically active every day. This includes: Aerobic exercise like running or swimming. Leisure activities like gardening, walking, or housework. Get 7-8 hours of sleep each night. If recommended by your health care provider, drink red wine in moderation. This means 1 glass a  day for nonpregnant women and 2 glasses a day for men. A glass of wine equals 5 oz (150 mL). Reading food labels  Check the serving size of packaged foods. For foods such as rice and pasta, the serving size refers to the amount of cooked product, not dry. Check the total fat in packaged foods. Avoid foods that have saturated fat or trans fats. Check the ingredients list for added sugars, such as corn syrup. Shopping  At  the grocery store, buy most of your food from the areas near the walls of the store. This includes: Fresh fruits and vegetables (produce). Grains, beans, nuts, and seeds. Some of these may be available in unpackaged forms or large amounts (in bulk). Fresh seafood. Poultry and eggs. Low-fat dairy products. Buy whole ingredients instead of prepackaged foods. Buy fresh fruits and vegetables in-season from local farmers markets. Buy frozen fruits and vegetables in resealable bags. If you do not have access to quality fresh seafood, buy precooked frozen shrimp or canned fish, such as tuna, salmon, or sardines. Buy small amounts of raw or cooked vegetables, salads, or olives from the deli or salad bar at your store. Stock your pantry so you always have certain foods on hand, such as olive oil, canned tuna, canned tomatoes, rice, pasta, and beans. Cooking  Cook foods with extra-virgin olive oil instead of using butter or other vegetable oils. Have meat as a side dish, and have vegetables or grains as your main dish. This means having meat in small portions or adding small amounts of meat to foods like pasta or stew. Use beans or vegetables instead of meat in common dishes like chili or lasagna. Experiment with different cooking methods. Try roasting or broiling vegetables instead of steaming or sauteing them. Add frozen vegetables to soups, stews, pasta, or rice. Add nuts or seeds for added healthy fat at each meal. You can add these to yogurt, salads, or vegetable dishes. Marinate fish or vegetables using olive oil, lemon juice, garlic, and fresh herbs. Meal planning  Plan to eat 1 vegetarian meal one day each week. Try to work up to 2 vegetarian meals, if possible. Eat seafood 2 or more times a week. Have healthy snacks readily available, such as: Vegetable sticks with hummus. Greek yogurt. Fruit and nut trail mix. Eat balanced meals throughout the week. This includes: Fruit: 2-3 servings a  day Vegetables: 4-5 servings a day Low-fat dairy: 2 servings a day Fish, poultry, or lean meat: 1 serving a day Beans and legumes: 2 or more servings a week Nuts and seeds: 1-2 servings a day Whole grains: 6-8 servings a day Extra-virgin olive oil: 3-4 servings a day Limit red meat and sweets to only a few servings a month What are my food choices? Mediterranean diet Recommended Grains: Whole-grain pasta. Brown rice. Bulgar wheat. Polenta. Couscous. Whole-wheat bread. [place]. Vegetables: Artichokes. Beets. Broccoli. Cabbage. Carrots. Eggplant. Green beans. Chard. Kale. Spinach. Onions. Leeks. Peas. Squash. Tomatoes. Peppers. Radishes. Fruits: Apples. Apricots. Avocado. Berries. Bananas. Cherries. Dates. Figs. Grapes. Lemons. Melon. Oranges. Peaches. Plums. Pomegranate. Meats and other protein foods: Beans. Almonds. Sunflower seeds. Pine nuts. Peanuts. [place]. Salmon. Scallops. Shrimp. [place]. Tilapia. Clams. Oysters. Eggs. Dairy: Low-fat milk. Cheese. Greek yogurt. Beverages: Water. Red wine. Herbal tea. Fats and oils: Extra virgin olive oil. Avocado oil. Grape seed oil. Sweets and desserts: [place] yogurt with honey. Baked apples. Poached pears. Trail mix. Seasoning and other foods: Basil. Cilantro. Coriander. Cumin. Mint. Parsley. Sage. Rosemary. Tarragon. Garlic. Oregano. Thyme.  Pepper. Balsalmic vinegar. Tahini. Hummus. Tomato sauce. Olives. Mushrooms. Limit these Grains: Prepackaged pasta or rice dishes. Prepackaged cereal with added sugar. Vegetables: Deep fried potatoes (french fries). Fruits: Fruit canned in syrup. Meats and other protein foods: Beef. Pork. Lamb. Poultry with skin. Hot dogs. Berniece Salines. Dairy: Ice cream. Sour cream. Whole milk. Beverages: Juice. Sugar-sweetened soft drinks. Beer. Liquor and spirits. Fats and oils: Butter. Canola oil. Vegetable oil. Beef fat (tallow). Lard. Sweets and desserts: Cookies. Cakes. Pies. Candy. Seasoning and other foods: Mayonnaise.  Premade sauces and marinades. The items listed may not be a complete list. Talk with your dietitian about what dietary choices are right for you. Summary The Mediterranean diet includes both food and lifestyle choices. Eat a variety of fresh fruits and vegetables, beans, nuts, seeds, and whole grains. Limit the amount of red meat and sweets that you eat. Talk with your health care provider about whether it is safe for you to drink red wine in moderation. This means 1 glass a day for nonpregnant women and 2 glasses a day for men. A glass of wine equals 5 oz (150 mL). This information is not intended to replace advice given to you by your health care provider. Make sure you discuss any questions you have with your health care provider. Document Released: 07/30/2016 Document Revised: 09/01/2016 Document Reviewed: 07/30/2016 Elsevier Interactive Patient Education  2017 [place] provider has requested that you have labwork completed today. Please go to [facility] ([id number]) on the second floor of this building before leaving the office today. You do not need to check in. If you are not called within 15 minutes please check with the front desk.

## 2022-10-07 ENCOUNTER — Ambulatory Visit: Payer: Medicare Other | Admitting: Physical Medicine and Rehabilitation

## 2022-10-07 ENCOUNTER — Encounter: Payer: Self-pay | Admitting: Physical Medicine and Rehabilitation

## 2022-10-07 VITALS — BP 128/57 | HR 44

## 2022-10-07 DIAGNOSIS — M5416 Radiculopathy, lumbar region: Secondary | ICD-10-CM

## 2022-10-07 DIAGNOSIS — R269 Unspecified abnormalities of gait and mobility: Secondary | ICD-10-CM | POA: Diagnosis not present

## 2022-10-07 DIAGNOSIS — M48062 Spinal stenosis, lumbar region with neurogenic claudication: Secondary | ICD-10-CM | POA: Diagnosis not present

## 2022-10-07 DIAGNOSIS — M25551 Pain in right hip: Secondary | ICD-10-CM

## 2022-10-07 NOTE — Progress Notes (Signed)
Numeric Pain Rating Scale and Functional Assessment Average Pain 2   In the last MONTH (on 0-10 scale) has pain interfered with the following?  1. General activity like being  able to carry out your everyday physical activities such as walking, climbing stairs, carrying groceries, or moving a chair?  Rating(4)   +Driver, -BT, -Dye Allergies.   Had a fall since last injection and could not walk for 4 weeks. Tramadol and Tylenol for pain.

## 2022-10-07 NOTE — Progress Notes (Signed)
Rick Melendez - 86 y.o. male MRN [id number]  Date of birth: 05-08-1936  Office Visit Note: Visit Date: 10/07/2022 PCP: Deland Pretty, MD Referred by: Deland Pretty, MD  Subjective: Chief Complaint  Patient presents with   Lower Back - Pain   HPI: Rick Melendez is a 86 y.o. male who comes in today for evaluation of chronic, worsening and severe bilateral lower back pain radiating to buttocks, hips and intermittently to right leg. Patient is well known to [place]. Last seen in our office in 2022. Pain ongoing for several years and is exacerbated by standing and walking. He describes pain as sore, aching and sharp, currently rates as 3 out of 10. Some relief of pain with home exercise regimen, rest and medications. Patient reports good relief with OTC topical CBD cream. He is currently attending formal physical therapy at [facility] in [place], feels this is helping alleviate pain from fall. Lumbar MRI imaging from 2021 exhibits advanced multi level degenerative changes. There is 6 mm anterolisthesis, severe spinal canal stenosis and severe bilateral lateral recess narrowing at the level of L4-L5. Patient did undergo bilateral L3-L4 and L4-L5 radiofrequency ablation in our office in 2021, he reports minimal relief of pain with this procedure. More recently he underwent bilateral L4 transforaminal epidural steroid injection in 2022, he reports significant relief of pain, greater than 50% for over a month. At this time, patient is managing lower back pain, he is more focused on completing physical therapy and healing from fall.  He does use a cane to assist with ambulation and prevent falls.  Patient denies focal weakness, numbness and tingling.    Patient reports right sided hip/buttock pain increased after mechanical fall in July. Patient fell directly on right hip while walking over [place] rocks. He was evaluated by Dr. Jean Rosenthal post fall, no fractures to right hip, per his notes he would  consider injection if pain persists.   Patients course is complicated by coronary artery disease,CABG x2, hypertension, peripheral vascular disease, diabetes mellitus and chronic kidney disease.   Review of Systems  Musculoskeletal:  Positive for back pain and joint pain.  Neurological:  Negative for tingling, sensory change, focal weakness and weakness.  All other systems reviewed and are negative.  Otherwise per HPI.  Assessment & Plan: Visit Diagnoses:    ICD-10-CM   1. Lumbar radiculopathy  M54.16     2. Spinal stenosis of lumbar region with neurogenic claudication  M48.062     3. Gait disturbance  R26.9     4. Right hip pain  M25.551        Plan: Findings:  1. Chronic, worsening and severe bilateral lower back pain radiating to buttocks, hips and intermittently to right leg. Patient continues to manage lower back pain with conservative therapies. Patients clinical presentation and exam are consistent with neurogenic claudication as a result of spinal canal stenosis. Patient would like to hold on injection at this time. If his pain worsens we would consider repeating bilateral L4 transforaminal epidural steroid injection. We did discuss possible surgical consultation, however I am unsure if patient would be surgical candidate as he does have several other chronic health issues. Patient instructed to let us know if he would like to proceed with injection. No red flag symptoms noted upon exam today.   2. Chronic right hip/buttock pain. Patient instructed to continue with physical therapy and home exercise regimen. If pain persists we recommend follow up with Dr. Ninfa Linden to discuss other treatment  options and possible injection.     Meds & Orders: No orders of the defined types were placed in this encounter.  No orders of the defined types were placed in this encounter.   Follow-up: Return if symptoms worsen or fail to improve.   Procedures: No procedures performed       Clinical History: EXAM: MRI LUMBAR SPINE WITHOUT CONTRAST   TECHNIQUE: Multiplanar, multisequence MR imaging of the lumbar spine was performed. No intravenous contrast was administered.   COMPARISON:  Lumbar MRI 05/27/2014   FINDINGS: Segmentation:  Normal.  Lowest disc space L5-S1   Alignment: Mild anterolisthesis T12-L1. 4 mm retrolisthesis L1-2. Slight retrolisthesis L3-4. 6 mm anterolisthesis L4-5. Mild retrolisthesis L5-S1.   Vertebrae:  Normal bone marrow.  Negative for fracture or mass.   Conus medullaris and cauda equina: Conus extends to the L1-2 level. Conus and cauda equina appear normal.   Paraspinal and other soft tissues: Negative for mass or adenopathy. No soft tissue edema or fluid collection.   Disc levels:   T12-L1: Advanced disc degeneration left greater than right. Prominent left-sided spurring. Bilateral facet degeneration. Moderate spinal stenosis. Severe subarticular and foraminal stenosis on the left. Mild subarticular stenosis on the right. Mild progression of stenosis on the left since the prior study   L1-2: Advanced disc degeneration with diffuse endplate spurring and marked disc space narrowing. Mild facet degeneration. Mild to moderate spinal stenosis. Moderate subarticular stenosis bilaterally. No interval change   L2-3: Disc degeneration with diffuse disc bulging and endplate spurring. Mild facet degeneration. Mild subarticular stenosis bilaterally with mild progression   L3-4: Disc degeneration with diffuse disc bulging and spurring, right greater than left. Mild facet degeneration. Mild spinal stenosis. Moderate to severe right subarticular stenosis. Moderate left subarticular stenosis. No significant change   L4-5: 6 mm anterolisthesis with severe facet degeneration and diffuse bulging of the disc. Severe spinal stenosis and severe subarticular stenosis bilaterally have progressed since the prior study. In addition, there is  severe right foraminal encroachment with right L4 nerve root impingement and moderate left foraminal encroachment.   L5-S1: Left paracentral disc protrusion with compression of the left S1 nerve root similar to the prior study. Mild facet degeneration bilaterally.   IMPRESSION: Advanced multilevel degenerative change throughout the lumbar spine. There has been progression of degenerative changes stenosis at multiple levels as described above   6 mm anterolisthesis L4-5. Severe spinal and severe subarticular stenosis bilaterally have progressed in the interval. Severe right foraminal encroachment.     Electronically Signed   By: Franchot Gallo M.D.   On: 08/22/2020 21:23   He reports that he quit smoking about 41 years ago. His smoking use included cigarettes. His smokeless tobacco use includes chew. No results for input(s): "HGBA1C", "LABURIC" in the last 8760 hours.  Objective:  VS:  HT:    WT:   BMI:     BP:(!) 128/57  HR:(!) 44bpm  TEMP: ( )  RESP:  Physical Exam Vitals and nursing note reviewed.  HENT:     Head: Normocephalic and atraumatic.     Right Ear: External ear normal.     Left Ear: External ear normal.     Nose: Nose normal.     Mouth/Throat:     Mouth: Mucous membranes are moist.  Eyes:     Extraocular Movements: Extraocular movements intact.  Cardiovascular:     Rate and Rhythm: Normal rate.     Pulses: Normal pulses.  Pulmonary:     Effort:  Pulmonary effort is normal.  Abdominal:     General: Abdomen is flat. There is no distension.  Musculoskeletal:        General: Tenderness present.     Cervical back: Normal range of motion.     Comments: Pt is slow to rise from seated position to standing. Good lumbar range of motion. Strong distal strength without clonus, no pain upon palpation of greater trochanters. No pain with internal/external rotation of right hip. Sensation intact bilaterally. Ambulates with cane, gait unsteady.   Skin:    General:  Skin is warm and dry.     Capillary Refill: Capillary refill takes less than 2 seconds.  Neurological:     Mental Status: He is alert.     Gait: Gait abnormal.  Psychiatric:        Mood and Affect: Mood normal.        Behavior: Behavior normal.     Ortho Exam  Imaging: No results found.  Past Medical/Family/Surgical/Social History: Medications & Allergies reviewed per EMR, new medications updated. Patient Active Problem List   Diagnosis Date Noted   Slow heart rate 04/14/2022   Influenza B    AKI (acute kidney injury) ([place])    CAP (community acquired pneumonia) 03/19/2017   Bilateral primary osteoarthritis of knee 01/28/2017   Impingement syndrome of right shoulder 01/28/2017   CAD - CABG X 2 9/07 LIMA-LAD, SVG-OM. Low risk Myoview 7/12 09/07/2013   HTN (hypertension) 09/07/2013   Dyslipidemia 09/07/2013   Diabetes mellitus ([place]) 09/07/2013   Chronic renal insufficiency, stage III (moderate)- SCr 1.6 (Aug 2013) 09/07/2013   Sleep apnea- compliant with C-pap 09/07/2013   PVD (peripheral vascular disease)- Rt iliac disease by doppler 7/13 09/07/2013   Past Medical History:  Diagnosis Date   Arthritis    Coronary artery disease 2007   cabg x3   Diabetes mellitus without complication ([place])    Hyperlipidemia    Hypertension    Influenza B 03/19/2017   OSA on CPAP    Dr Claiborne Billings - follows and treatmentof sleep apnea   PVD (peripheral vascular disease) ([place]) 2003   DR BERRY-  -LEFT LEG STENTING   Family History  Problem Relation Age of Onset   Cancer Father    Cancer Brother    Past Surgical History:  Procedure Laterality Date   CARDIAC CATHETERIZATION  0911/2007   3 vessel CAD WITH LEFT MAIN CAD SEVERE ,norm lLIMA,LV nom. ,evidence for very mild aortic stenosis with gradient, mild stenosis distal abdnminal aotra and proximal left common iliac artery 30%   CORONARY ARTERY BYPASS GRAFT  08/31/2006   DR GERHARDT---LIMA to LAD, VEIN TO AN OBTUSE MARGINAL BRANCH AND  RIGHT  CORONARY ARTERY   CPET  04/13/2012   normal   DOPPLER ECHOCARDIOGRAPHY  07/08/2011   EF =>55%   lower arterial doppler  07/01/2012   mildly abnormal lower extremity;ABIs >1 bilaterally with moderately high-grade right iliac stenosis that had not changed from proir study.   NM MYOCAR PERF WALL MOTION  7/182012   EF 59%, normal myocardial perfusio   Social History   Occupational History   Not on file  Tobacco Use   Smoking status: Former    Types: Cigarettes    Quit date: 09/03/1981    Years since quitting: 41.1   Smokeless tobacco: Current    Types: Chew  Substance and Sexual Activity   Alcohol use: Yes   Drug use: No   Sexual activity: Not on file

## 2022-10-08 DIAGNOSIS — M62551 Muscle wasting and atrophy, not elsewhere classified, right thigh: Secondary | ICD-10-CM | POA: Diagnosis not present

## 2022-10-08 DIAGNOSIS — M62561 Muscle wasting and atrophy, not elsewhere classified, right lower leg: Secondary | ICD-10-CM | POA: Diagnosis not present

## 2022-10-08 DIAGNOSIS — R2681 Unsteadiness on feet: Secondary | ICD-10-CM | POA: Diagnosis not present

## 2022-10-08 DIAGNOSIS — M62552 Muscle wasting and atrophy, not elsewhere classified, left thigh: Secondary | ICD-10-CM | POA: Diagnosis not present

## 2022-10-08 DIAGNOSIS — M25551 Pain in right hip: Secondary | ICD-10-CM | POA: Diagnosis not present

## 2022-10-14 DIAGNOSIS — M62551 Muscle wasting and atrophy, not elsewhere classified, right thigh: Secondary | ICD-10-CM | POA: Diagnosis not present

## 2022-10-14 DIAGNOSIS — M62552 Muscle wasting and atrophy, not elsewhere classified, left thigh: Secondary | ICD-10-CM | POA: Diagnosis not present

## 2022-10-14 DIAGNOSIS — R2681 Unsteadiness on feet: Secondary | ICD-10-CM | POA: Diagnosis not present

## 2022-10-14 DIAGNOSIS — M25551 Pain in right hip: Secondary | ICD-10-CM | POA: Diagnosis not present

## 2022-10-14 DIAGNOSIS — M62561 Muscle wasting and atrophy, not elsewhere classified, right lower leg: Secondary | ICD-10-CM | POA: Diagnosis not present

## 2022-11-19 DIAGNOSIS — H04123 Dry eye syndrome of bilateral lacrimal glands: Secondary | ICD-10-CM | POA: Diagnosis not present

## 2022-11-19 DIAGNOSIS — Z961 Presence of intraocular lens: Secondary | ICD-10-CM | POA: Diagnosis not present

## 2022-11-19 DIAGNOSIS — E119 Type 2 diabetes mellitus without complications: Secondary | ICD-10-CM | POA: Diagnosis not present

## 2022-11-19 DIAGNOSIS — H26492 Other secondary cataract, left eye: Secondary | ICD-10-CM | POA: Diagnosis not present

## 2022-12-01 DIAGNOSIS — R059 Cough, unspecified: Secondary | ICD-10-CM | POA: Diagnosis not present

## 2022-12-07 DIAGNOSIS — K219 Gastro-esophageal reflux disease without esophagitis: Secondary | ICD-10-CM | POA: Diagnosis not present

## 2022-12-07 DIAGNOSIS — E118 Type 2 diabetes mellitus with unspecified complications: Secondary | ICD-10-CM | POA: Diagnosis not present

## 2022-12-07 DIAGNOSIS — Z23 Encounter for immunization: Secondary | ICD-10-CM | POA: Diagnosis not present

## 2023-01-04 ENCOUNTER — Ambulatory Visit: Payer: Medicare Other | Admitting: Orthopedic Surgery

## 2023-01-05 ENCOUNTER — Encounter: Payer: Self-pay | Admitting: Physician Assistant

## 2023-01-05 ENCOUNTER — Ambulatory Visit: Payer: Medicare Other | Admitting: Physician Assistant

## 2023-01-05 VITALS — BP 110/65 | HR 74 | Resp 18 | Ht 67.0 in | Wt 196.0 lb

## 2023-01-05 DIAGNOSIS — G3184 Mild cognitive impairment, so stated: Secondary | ICD-10-CM | POA: Diagnosis not present

## 2023-01-05 NOTE — Progress Notes (Signed)
Assessment/Plan:   Memory Impairment   Rick Melendez is a very pleasant 87 y.o. RH male with a history of hypertension, hyperlipidemia, DM2, osteoarthritis with multiple surgeries, mild anemia, hard of hearing, OSA not on CPAP after weight loss, CAD, bradycardia, hypoproteinemia presenting today in follow-up for evaluation of memory loss.  Personally reviewed of the brain was remarkable for mild global atrophy without any acute abnormalities. He is on memantine 5 mg bid, tolerating well. No significant cognitive changes are noted on today's visit.    Recommendations:   Follow up in 6  months. Recommend hearing test for hearing aids  Continue memantine 5 mg twice daily, side effects discussed Continue to control mood as per PCP Use the CPAP for OSA  Recommend good control of cardiovascular risk factors    Subjective:   This patient is accompanied in the office by his wife who supplements the history. Previous records as well as any outside records available were reviewed prior to todays visit.   Patient was last seen on 10/02/2022, at which time his MoCA was 20/30.     Any changes in memory since last visit?  He denies any difference since the last visit . Patient able to remember recent conversations and people names , LTM is good. He does not do any brain exercises. Enjoys [place] repeats oneself?  Endorsed Disoriented when walking into a room?  Patient denies  Leaving objects in unusual places?  Patient denies   Wandering behavior?   denies   Any personality changes since last visit?   denies . "He is always in a good mood"  Any worsening depression?: denies   Hallucinations or paranoia?  denies   Seizures?   denies    Any sleep changes? " I don't sleep, because I nap all day " "Denies  vivid dreams, REM behavior or sleepwalking   Sleep apnea? "He has not been using it, need to get back to it" Any hygiene concerns?  denies   Independent of bathing and dressing?  Endorsed   Does the patient needs help with medications? Wife is in charge (always)    Who is in charge of the finances?  Wife is in charge (always)    Any changes in appetite?  denies     Patient have trouble swallowing?  denies   Does the patient cook?  Any kitchen accidents such as leaving the stove on?   denies   Any headaches?    denies   Vision changes? denies Chronic back pain endorsed, after falling in August "his balance not too great" seen by orthopedics for lower back pain, especially at the right side of the hip and buttock, he has tried different conservative therapies including steroid injections. He is not very interested in NS consultation.  Ambulates with difficulty?    denies   Recent falls or head injuries?   Fell on his knee in the yard recently. No LOC or head injury  Unilateral weakness, numbness or tingling?   denies   Any tremors?  denies   Any anosmia?    denies   Any incontinence of urine?  denies   Any bowel dysfunction?  denies      Patient lives  with wife Does the patient drive?"Lord yeah!"  Initial evaluation 10/02/2022  How long did patient have memory difficulties? "I didn't notice anything".  Wife "is not sure that he has significant memory changes, the doctor sent me here".  Disoriented when walking into a room?  Patient denies   Leaving objects in unusual places?  Patient denies   Patient lives wife Ambulates  with difficulty? Uses a walker and a cane for stability due to arthritis  Recent falls?  Fell at the end of July and uses a walker. ONnPT/OT weekly  Any head injuries?  Patient denies   History of seizures?   Patient denies   Wandering behavior?  Patient denies   Patient drives? No issues driving  Any mood changes ?  Denies  Any depression?:  Patient denies  "I like me too good!" Hallucinations?  Patient denies now, but when" I was on Ozempic he did, once off I was fine". Paranoia?  Patient denies   Patient reports that sleeps well without vivid  dreams, REM behavior or sleepwalking    History of sleep apnea? Endorsed, but after 40 lb weight loss "I no longer need CPAP"" Any hygiene concerns?  Patient denies   Independent of bathing and dressing?  Endorsed  Does the patient needs help with medications? He is in charge  Who is in charge of the finances? Wife has always been is in charge   Any changes in appetite?  Patient denies   Patient have trouble swallowing? Patient denies   Does the patient cook?  Patient denies   Any kitchen accidents such as leaving the stove on? Patient denies   Any headaches?  Patient denies   Double vision? Patient denies   Any focal numbness or tingling?  Patient denies   Chronic back pain: Endorsed, s/p falls and several surgeries shoulder, C spine Unilateral weakness?  Patient denies   Any tremors?  Patient denies   Any history of anosmia?  Patient denies   Any incontinence of urine?  Patient denies   Any bowel dysfunction?   Patient denies   History of heavy alcohol intake?  Patient denies   History of heavy tobacco use?  Patient denies   Family history of dementia?   Denies   Retired plumber   MRI brain report 09/22/22 There is mild diffuse cerebral atrophy. No hippocampal atrophy.     Labs 10/02/2022 TSH 1.93  Past Medical History:  Diagnosis Date   Arthritis    Coronary artery disease 2007   cabg x3   Diabetes mellitus without complication ([place])    Hyperlipidemia    Hypertension    Influenza B 03/19/2017   OSA on CPAP    Dr Claiborne Billings - follows and treatmentof sleep apnea   PVD (peripheral vascular disease) ([place]) 2003   DR BERRY-  -LEFT LEG STENTING     Past Surgical History:  Procedure Laterality Date   CARDIAC CATHETERIZATION  0911/2007   3 vessel CAD WITH LEFT MAIN CAD SEVERE ,norm lLIMA,LV nom. ,evidence for very mild aortic stenosis with gradient, mild stenosis distal abdnminal aotra and proximal left common iliac artery 30%   CORONARY ARTERY BYPASS GRAFT  08/31/2006   DR  GERHARDT---LIMA to LAD, VEIN TO AN OBTUSE MARGINAL BRANCH AND  RIGHT CORONARY ARTERY   CPET  04/13/2012   normal   DOPPLER ECHOCARDIOGRAPHY  07/08/2011   EF =>55%   lower arterial doppler  07/01/2012   mildly abnormal lower extremity;ABIs >1 bilaterally with moderately high-grade right iliac stenosis that had not changed from proir study.   NM MYOCAR PERF WALL MOTION  7/182012   EF 59%, normal myocardial perfusio     PREVIOUS MEDICATIONS:   CURRENT MEDICATIONS:  Outpatient Encounter Medications as of 01/05/2023  Medication Sig  aspirin EC 81 MG tablet Take 81 mg by mouth daily.   clopidogrel (PLAVIX) 75 MG tablet Take 75 mg by mouth daily.   Coenzyme Q10 (COQ10) 200 MG CAPS Take 200 mg by mouth daily.   ezetimibe (ZETIA) 10 MG tablet Take 10 mg by mouth daily.   FARXIGA 10 MG TABS tablet Take 10 mg by mouth daily.   furosemide (LASIX) 20 MG tablet Take 1 tablet by mouth daily.   gabapentin (NEURONTIN) 300 MG capsule Take 1 capsule by mouth at night for 7 nights and then 1 in the morning and one at night for 7 days and then 3 times a day.   glipiZIDE (GLUCOTROL) 5 MG tablet Take 5 mg by mouth daily.   HYDROcodone-acetaminophen (NORCO/VICODIN) 5-325 MG per tablet Take 1 tablet by mouth 3 (three) times daily.   lisinopril (ZESTRIL) 5 MG tablet Take 5 mg by mouth daily.   memantine (NAMENDA) 5 MG tablet Take 1 tablet (5 mg at night) for 2 weeks, then increase to 1 tablet (5 mg) twice a day   metoprolol tartrate (LOPRESSOR) 25 MG tablet Take 12.5 mg by mouth daily.    Omega-3 Fatty Acids (OMEGA-3 FISH OIL) 1200 MG CAPS Take 2,400 mg by mouth 2 (two) times daily. Take 4 capsules daily   omeprazole (PRILOSEC) 20 MG capsule Take 1 capsule by mouth daily.   ONETOUCH VERIO test strip U FOR GLUCOSE TESTING TID   OVER THE COUNTER MEDICATION USES CPAP NIGHTLY   OZEMPIC, 0.25 OR 0.5 MG/DOSE, 2 MG/1.5ML SOPN    rosuvastatin (CRESTOR) 10 MG tablet Take 10 mg by mouth at bedtime.   traMADol (ULTRAM)  50 MG tablet Take 50-100 mg by mouth 3 (three) times daily.   No facility-administered encounter medications on file as of 01/05/2023.     Objective:     PHYSICAL EXAMINATION:    VITALS:   Vitals:   01/05/23 0923  BP: 110/65  Pulse: 74  Resp: 18  SpO2: 95%  Weight: 196 lb (88.9 kg)  Height: '5\' 7"'$  (1.702 m)    GEN:  The patient appears stated age and is in NAD. HEENT:  Normocephalic, atraumatic.   Neurological examination:  General: NAD, well-groomed, appears stated age. Orientation: The patient is alert. Oriented to person, place and date Cranial nerves: There is good facial symmetry.The speech is fluent and clear. No aphasia or dysarthria. Very HOH. Fund of knowledge is appropriate. Recent memory impaired and remote memory is normal.  Attention and concentration are normal.  Able to name objects and repeat phrases.  Hearing is intact to conversational tone.   Delayed recall 2/3 Sensation: Sensation is intact to light touch throughout Motor: Strength is at least antigravity x4. Tremors: none  DTR's 1/4 in UE/LE      10/02/2022   10:00 AM  Montreal Cognitive Assessment   Visuospatial/ Executive (0/5) 1  Naming (0/3) 3  Attention: Read list of digits (0/2) 2  Attention: Read list of letters (0/1) 1  Attention: Serial 7 subtraction starting at 100 (0/3) 3  Language: Repeat phrase (0/2) 1  Language : Fluency (0/1) 0  Abstraction (0/2) 0  Delayed Recall (0/5) 4  Orientation (0/6) 5  Total 20  Adjusted Score (based on education) 20        No data to display             Movement examination: Tone: There is normal tone in the UE/LE Abnormal movements:  no tremor.  No myoclonus.  No asterixis.   Coordination:  There is no decremation with RAM's. Normal finger to nose  Gait and Station: The patient has no difficulty arising out of a deep-seated chair without the use of the hands. The patient's stride length is good.  Gait is cautious and narrow.   Thank you for  allowing [place] the opportunity to participate in the care of this nice patient. Please do not hesitate to contact us for any questions or concerns.   Total time spent on today's visit was 20 minutes dedicated to this patient today, preparing to see patient, examining the patient, ordering tests and/or medications and counseling the patient, documenting clinical information in the EHR or other health record, independently interpreting results and communicating results to the patient/family, discussing treatment and goals, answering patient's questions and coordinating care.  Cc:  Deland Pretty, MD  Sharene Butters 01/05/2023 11:12 AM

## 2023-01-05 NOTE — Patient Instructions (Addendum)
It was a pleasure to see you today at our office.   Recommendations:  Continue  Memantine  5 mg  twice a day   Follow up in 6 months  Recommend hearing test Use the CPAP!!!  Whom to call:  Memory  decline, memory medications: Call our office [phone]   For psychiatric meds, mood meds: Please have your primary care physician manage these medications.    For assessment of decision of mental capacity and competency:  Call Dr. Anthoney Harada, geriatric psychiatrist at [phone]  For guidance in geriatric dementia issues please call [facility] [phone]     If you have any severe symptoms of a stroke, or other severe issues such as confusion,severe chills or fever, etc call 911 or go to the ER as you may need to be evaluated further   Feel free to visit Facebook page " [facility]" for tips of how to care for people with memory problems.      RECOMMENDATIONS FOR ALL PATIENTS WITH MEMORY PROBLEMS: 1. Continue to exercise (Recommend 30 minutes of walking everyday, or 3 hours every week) 2. Increase social interactions - continue going to [place] and enjoy social gatherings with friends and family 3. Eat healthy, avoid fried foods and eat more fruits and vegetables 4. Maintain adequate blood pressure, blood sugar, and blood cholesterol level. Reducing the risk of stroke and cardiovascular disease also helps promoting better memory. 5. Avoid stressful situations. Live a simple life and avoid aggravations. Organize your time and prepare for the next day in anticipation. 6. Sleep well, avoid any interruptions of sleep and avoid any distractions in the bedroom that may interfere with adequate sleep quality 7. Avoid sugar, avoid sweets as there is a strong link between excessive sugar intake, diabetes, and cognitive impairment We discussed the Mediterranean diet, which has been shown to help patients reduce the risk of progressive memory disorders and reduces cardiovascular  risk. This includes eating fish, eat fruits and green leafy vegetables, nuts like almonds and hazelnuts, walnuts, and also use olive oil. Avoid fast foods and fried foods as much as possible. Avoid sweets and sugar as sugar use has been linked to worsening of memory function.  There is always a concern of gradual progression of memory problems. If this is the case, then we may need to adjust level of care according to patient needs. Support, both to the patient and caregiver, should then be put into place.     FALL PRECAUTIONS: Be cautious when walking. Scan the area for obstacles that may increase the risk of trips and falls. When getting up in the mornings, sit up at the edge of the bed for a few minutes before getting out of bed. Consider elevating the bed at the head end to avoid drop of blood pressure when getting up. Walk always in a well-lit room (use night lights in the walls). Avoid area rugs or power cords from appliances in the middle of the walkways. Use a walker or a cane if necessary and consider physical therapy for balance exercise. Get your eyesight checked regularly.  FINANCIAL OVERSIGHT: Supervision, especially oversight when making financial decisions or transactions is also recommended.  HOME SAFETY: Consider the safety of the kitchen when operating appliances like stoves, microwave oven, and blender. Consider having supervision and share cooking responsibilities until no longer able to participate in those. Accidents with firearms and other hazards in the house should be identified and addressed as well.   ABILITY TO BE LEFT  ALONE: If patient is unable to contact 911 operator, consider using LifeLine, or when the need is there, arrange for someone to stay with patients. Smoking is a fire hazard, consider supervision or cessation. Risk of wandering should be assessed by caregiver and if detected at any point, supervision and safe proof recommendations should be  instituted.  MEDICATION SUPERVISION: Inability to self-administer medication needs to be constantly addressed. Implement a mechanism to ensure safe administration of the medications.   DRIVING: Regarding driving, in patients with progressive memory problems, driving will be impaired. We advise to have someone else do the driving if trouble finding directions or if minor accidents are reported. Independent driving assessment is available to determine safety of driving.   If you are interested in the driving assessment, you can contact the following:  [employer] in [place]  [place] [phone] or [phone]    [place] refers to food and lifestyle choices that are based on the traditions of countries located on the [employer]. This way of eating has been shown to help prevent certain conditions and improve outcomes for people who have chronic diseases, like kidney disease and heart disease. What are tips for following this plan? Lifestyle  Cook and eat meals together with your family, when possible. Drink enough fluid to keep your urine clear or pale yellow. Be physically active every day. This includes: Aerobic exercise like running or swimming. Leisure activities like gardening, walking, or housework. Get 7-8 hours of sleep each night. If recommended by your health care provider, drink red wine in moderation. This means 1 glass a day for nonpregnant women and 2 glasses a day for men. A glass of wine equals 5 oz (150 mL). Reading food labels  Check the serving size of packaged foods. For foods such as rice and pasta, the serving size refers to the amount of cooked product, not dry. Check the total fat in packaged foods. Avoid foods that have saturated fat or trans fats. Check the ingredients list for added sugars, such as  corn syrup. Shopping  At the grocery store, buy most of your food from the areas near the walls of the store. This includes: Fresh fruits and vegetables (produce). Grains, beans, nuts, and seeds. Some of these may be available in unpackaged forms or large amounts (in bulk). Fresh seafood. Poultry and eggs. Low-fat dairy products. Buy whole ingredients instead of prepackaged foods. Buy fresh fruits and vegetables in-season from local farmers markets. Buy frozen fruits and vegetables in resealable bags. If you do not have access to quality fresh seafood, buy precooked frozen shrimp or canned fish, such as tuna, salmon, or sardines. Buy small amounts of raw or cooked vegetables, salads, or olives from the deli or salad bar at your store. Stock your pantry so you always have certain foods on hand, such as olive oil, canned tuna, canned tomatoes, rice, pasta, and beans. Cooking  Cook foods with extra-virgin olive oil instead of using butter or other vegetable oils. Have meat as a side dish, and have vegetables or grains as your main dish. This means having meat in small portions or adding small amounts of meat to foods like pasta or stew. Use beans or vegetables instead of meat in common dishes like chili or lasagna. Experiment with different cooking methods. Try roasting or broiling vegetables instead of steaming or sauteing them. Add frozen vegetables to soups, stews, pasta,  or rice. Add nuts or seeds for added healthy fat at each meal. You can add these to yogurt, salads, or vegetable dishes. Marinate fish or vegetables using olive oil, lemon juice, garlic, and fresh herbs. Meal planning  Plan to eat 1 vegetarian meal one day each week. Try to work up to 2 vegetarian meals, if possible. Eat seafood 2 or more times a week. Have healthy snacks readily available, such as: Vegetable sticks with hummus. Greek yogurt. Fruit and nut trail mix. Eat balanced meals throughout the week. This  includes: Fruit: 2-3 servings a day Vegetables: 4-5 servings a day Low-fat dairy: 2 servings a day Fish, poultry, or lean meat: 1 serving a day Beans and legumes: 2 or more servings a week Nuts and seeds: 1-2 servings a day Whole grains: 6-8 servings a day Extra-virgin olive oil: 3-4 servings a day Limit red meat and sweets to only a few servings a month What are my food choices? Mediterranean diet Recommended Grains: Whole-grain pasta. Brown rice. Bulgar wheat. Polenta. Couscous. Whole-wheat bread. [place]. Vegetables: Artichokes. Beets. Broccoli. Cabbage. Carrots. Eggplant. Green beans. Chard. Kale. Spinach. Onions. Leeks. Peas. Squash. Tomatoes. Peppers. Radishes. Fruits: Apples. Apricots. Avocado. Berries. Bananas. Cherries. Dates. Figs. Grapes. Lemons. Melon. Oranges. Peaches. Plums. Pomegranate. Meats and other protein foods: Beans. Almonds. Sunflower seeds. Pine nuts. Peanuts. [place]. Salmon. Scallops. Shrimp. [place]. Tilapia. Clams. Oysters. Eggs. Dairy: Low-fat milk. Cheese. Greek yogurt. Beverages: Water. Red wine. Herbal tea. Fats and oils: Extra virgin olive oil. Avocado oil. Grape seed oil. Sweets and desserts: [place] yogurt with honey. Baked apples. Poached pears. Trail mix. Seasoning and other foods: Basil. Cilantro. Coriander. Cumin. Mint. Parsley. Sage. Rosemary. Tarragon. Garlic. Oregano. Thyme. Pepper. Balsalmic vinegar. Tahini. Hummus. Tomato sauce. Olives. Mushrooms. Limit these Grains: Prepackaged pasta or rice dishes. Prepackaged cereal with added sugar. Vegetables: Deep fried potatoes (french fries). Fruits: Fruit canned in syrup. Meats and other protein foods: Beef. Pork. Lamb. Poultry with skin. Hot dogs. [place] Salines. Dairy: Ice cream. Sour cream. Whole milk. Beverages: Juice. Sugar-sweetened soft drinks. Beer. Liquor and spirits. Fats and oils: Butter. Canola oil. Vegetable oil. Beef fat (tallow). Lard. Sweets and desserts: Cookies. Cakes. Pies. Candy. Seasoning  and other foods: Mayonnaise. Premade sauces and marinades. The items listed may not be a complete list. Talk with your dietitian about what dietary choices are right for you. Summary The Mediterranean diet includes both food and lifestyle choices. Eat a variety of fresh fruits and vegetables, beans, nuts, seeds, and whole grains. Limit the amount of red meat and sweets that you eat. Talk with your health care provider about whether it is safe for you to drink red wine in moderation. This means 1 glass a day for nonpregnant women and 2 glasses a day for men. A glass of wine equals 5 oz (150 mL). This information is not intended to replace advice given to you by your health care provider. Make sure you discuss any questions you have with your health care provider. Document Released: 07/30/2016 Document Revised: 09/01/2016 Document Reviewed: 07/30/2016 Elsevier Interactive Patient Education  2017 [place] provider has requested that you have labwork completed today. Please go to [facility] ([id number]) on the second floor of this building before leaving the office today. You do not need to check in. If you are not called within 15 minutes please check with the front desk.

## 2023-01-07 ENCOUNTER — Ambulatory Visit: Payer: Medicare Other | Admitting: Orthopedic Surgery

## 2023-01-07 DIAGNOSIS — M170 Bilateral primary osteoarthritis of knee: Secondary | ICD-10-CM

## 2023-01-07 DIAGNOSIS — M1712 Unilateral primary osteoarthritis, left knee: Secondary | ICD-10-CM | POA: Diagnosis not present

## 2023-01-07 DIAGNOSIS — M1711 Unilateral primary osteoarthritis, right knee: Secondary | ICD-10-CM | POA: Diagnosis not present

## 2023-01-12 ENCOUNTER — Encounter: Payer: Self-pay | Admitting: Orthopedic Surgery

## 2023-01-12 DIAGNOSIS — M1711 Unilateral primary osteoarthritis, right knee: Secondary | ICD-10-CM | POA: Diagnosis not present

## 2023-01-12 NOTE — Progress Notes (Signed)
Office Visit Note   Patient: Rick Melendez           Date of Birth: [birth year]           MRN: [id number] Visit Date: 01/07/2023              Requested by: Deland Pretty, MD [address] [place],  [place] [address] PCP: Deland Pretty, MD  Chief Complaint  Patient presents with   Left Knee - Pain   Right Knee - Pain      HPI: Patient is an 87 year old gentleman who presents in follow-up for osteoarthritis bilateral knees.  Patient has had good interval relief of his knee pain with previous steroid injections.  Assessment & Plan: Visit Diagnoses:  1. Bilateral primary osteoarthritis of knee     Plan: Both knees were injected he tolerated this well.  Follow-Up Instructions: Return in about 3 months (around 04/08/2023).   Ortho Exam  Patient is alert, oriented, no adenopathy, well-dressed, normal affect, normal respiratory effort. Examination patient has no effusion or cellulitis around both knees.  There is crepitation with range of motion bilaterally he has tenderness to palpation of the medial lateral joint line as well as the medial lateral patella.  Imaging: No results found. No images are attached to the encounter.  Labs: Lab Results  Component Value Date   REPTSTATUS 03/24/2017 FINAL 03/19/2017   CULT NO GROWTH 5 DAYS 03/19/2017     No results found for: "ALBUMIN", "PREALBUMIN", "CBC"  Lab Results  Component Value Date   MG 1.5 (L) 03/19/2017   No results found for: "VD25OH"  No results found for: "PREALBUMIN"    Latest Ref Rng & Units 03/20/2017    4:43 AM 03/19/2017   12:47 PM  CBC EXTENDED  WBC 4.0 - 10.5 K/uL 8.4  10.9   RBC 4.22 - 5.81 MIL/uL 4.90  5.54   Hemoglobin 13.0 - 17.0 g/dL 12.4  14.3   HCT 39.0 - 52.0 % 40.3  45.6   Platelets 150 - 400 K/uL 146  177   NEUT# 1.7 - 7.7 K/uL  7.5   Lymph# 0.7 - 4.0 K/uL  1.7      There is no height or weight on file to calculate BMI.  Orders:  No orders of the defined types were  placed in this encounter.  No orders of the defined types were placed in this encounter.    Procedures: Large Joint Inj: bilateral knee on 01/12/2023 9:32 AM Indications: pain and diagnostic evaluation Details: 22 G 1.5 in needle, anteromedial approach  Arthrogram: No  Outcome: tolerated well, no immediate complications Procedure, treatment alternatives, risks and benefits explained, specific risks discussed. Consent was given by the patient. Immediately prior to procedure a time out was called to verify the correct patient, procedure, equipment, support staff and site/side marked as required. Patient was prepped and draped in the usual sterile fashion.      Clinical Data: No additional findings.  ROS:  All other systems negative, except as noted in the HPI. Review of Systems  Objective: Vital Signs: There were no vitals taken for this visit.  Specialty Comments:  EXAM: MRI LUMBAR SPINE WITHOUT CONTRAST   TECHNIQUE: Multiplanar, multisequence MR imaging of the lumbar spine was performed. No intravenous contrast was administered.   COMPARISON:  Lumbar MRI 05/27/2014   FINDINGS: Segmentation:  Normal.  Lowest disc space L5-S1   Alignment: Mild anterolisthesis T12-L1. 4 mm retrolisthesis L1-2. Slight retrolisthesis L3-4. 6 mm  anterolisthesis L4-5. Mild retrolisthesis L5-S1.   Vertebrae:  Normal bone marrow.  Negative for fracture or mass.   Conus medullaris and cauda equina: Conus extends to the L1-2 level. Conus and cauda equina appear normal.   Paraspinal and other soft tissues: Negative for mass or adenopathy. No soft tissue edema or fluid collection.   Disc levels:   T12-L1: Advanced disc degeneration left greater than right. Prominent left-sided spurring. Bilateral facet degeneration. Moderate spinal stenosis. Severe subarticular and foraminal stenosis on the left. Mild subarticular stenosis on the right. Mild progression of stenosis on the left since the  prior study   L1-2: Advanced disc degeneration with diffuse endplate spurring and marked disc space narrowing. Mild facet degeneration. Mild to moderate spinal stenosis. Moderate subarticular stenosis bilaterally. No interval change   L2-3: Disc degeneration with diffuse disc bulging and endplate spurring. Mild facet degeneration. Mild subarticular stenosis bilaterally with mild progression   L3-4: Disc degeneration with diffuse disc bulging and spurring, right greater than left. Mild facet degeneration. Mild spinal stenosis. Moderate to severe right subarticular stenosis. Moderate left subarticular stenosis. No significant change   L4-5: 6 mm anterolisthesis with severe facet degeneration and diffuse bulging of the disc. Severe spinal stenosis and severe subarticular stenosis bilaterally have progressed since the prior study. In addition, there is severe right foraminal encroachment with right L4 nerve root impingement and moderate left foraminal encroachment.   L5-S1: Left paracentral disc protrusion with compression of the left S1 nerve root similar to the prior study. Mild facet degeneration bilaterally.   IMPRESSION: Advanced multilevel degenerative change throughout the lumbar spine. There has been progression of degenerative changes stenosis at multiple levels as described above   6 mm anterolisthesis L4-5. Severe spinal and severe subarticular stenosis bilaterally have progressed in the interval. Severe right foraminal encroachment.     Electronically Signed   By: Franchot Gallo M.D.   On: 08/22/2020 21:23  PMFS History: Patient Active Problem List   Diagnosis Date Noted   Slow heart rate 04/14/2022   Influenza B    AKI (acute kidney injury) ([place])    CAP (community acquired pneumonia) 03/19/2017   Bilateral primary osteoarthritis of knee 01/28/2017   Impingement syndrome of right shoulder 01/28/2017   CAD - CABG X 2 9/07 LIMA-LAD, SVG-OM. Low risk Myoview  7/12 09/07/2013   HTN (hypertension) 09/07/2013   Dyslipidemia 09/07/2013   Diabetes mellitus ([place]) 09/07/2013   Chronic renal insufficiency, stage III (moderate)- SCr 1.6 (Aug 2013) 09/07/2013   Sleep apnea- compliant with C-pap 09/07/2013   PVD (peripheral vascular disease)- Rt iliac disease by doppler 7/13 09/07/2013   Past Medical History:  Diagnosis Date   Arthritis    Coronary artery disease 2007   cabg x3   Diabetes mellitus without complication ([place])    Hyperlipidemia    Hypertension    Influenza B 03/19/2017   OSA on CPAP    Dr Claiborne Billings - follows and treatmentof sleep apnea   PVD (peripheral vascular disease) ([employer]) 2003   DR BERRY-  -LEFT LEG STENTING    Family History  Problem Relation Age of Onset   Cancer Father    Cancer Brother     Past Surgical History:  Procedure Laterality Date   CARDIAC CATHETERIZATION  0911/2007   3 vessel CAD WITH LEFT MAIN CAD SEVERE ,norm lLIMA,LV nom. ,evidence for very mild aortic stenosis with gradient, mild stenosis distal abdnminal aotra and proximal left common iliac artery 30%   CORONARY ARTERY BYPASS GRAFT  08/31/2006   DR GERHARDT---LIMA to LAD, VEIN TO AN OBTUSE MARGINAL BRANCH AND  RIGHT CORONARY ARTERY   CPET  04/13/2012   normal   DOPPLER ECHOCARDIOGRAPHY  07/08/2011   EF =>55%   lower arterial doppler  07/01/2012   mildly abnormal lower extremity;ABIs >1 bilaterally with moderately high-grade right iliac stenosis that had not changed from proir study.   NM MYOCAR PERF WALL MOTION  7/182012   EF 59%, normal myocardial perfusio   Social History   Occupational History   Not on file  Tobacco Use   Smoking status: Former    Types: Cigarettes    Quit date: 09/03/1981    Years since quitting: 41.3   Smokeless tobacco: Current    Types: Chew  Substance and Sexual Activity   Alcohol use: Yes   Drug use: No   Sexual activity: Not on file

## 2023-02-23 DIAGNOSIS — C44622 Squamous cell carcinoma of skin of right upper limb, including shoulder: Secondary | ICD-10-CM | POA: Diagnosis not present

## 2023-02-23 DIAGNOSIS — L821 Other seborrheic keratosis: Secondary | ICD-10-CM | POA: Diagnosis not present

## 2023-02-23 DIAGNOSIS — D225 Melanocytic nevi of trunk: Secondary | ICD-10-CM | POA: Diagnosis not present

## 2023-02-23 DIAGNOSIS — D044 Carcinoma in situ of skin of scalp and neck: Secondary | ICD-10-CM | POA: Diagnosis not present

## 2023-02-23 DIAGNOSIS — Z85828 Personal history of other malignant neoplasm of skin: Secondary | ICD-10-CM | POA: Diagnosis not present

## 2023-02-23 DIAGNOSIS — L570 Actinic keratosis: Secondary | ICD-10-CM | POA: Diagnosis not present

## 2023-02-23 DIAGNOSIS — D692 Other nonthrombocytopenic purpura: Secondary | ICD-10-CM | POA: Diagnosis not present

## 2023-02-23 DIAGNOSIS — B079 Viral wart, unspecified: Secondary | ICD-10-CM | POA: Diagnosis not present

## 2023-02-23 DIAGNOSIS — D485 Neoplasm of uncertain behavior of skin: Secondary | ICD-10-CM | POA: Diagnosis not present

## 2023-03-05 ENCOUNTER — Ambulatory Visit: Payer: Medicare Other | Admitting: Podiatry

## 2023-03-05 DIAGNOSIS — B351 Tinea unguium: Secondary | ICD-10-CM | POA: Diagnosis not present

## 2023-03-05 DIAGNOSIS — M79674 Pain in right toe(s): Secondary | ICD-10-CM

## 2023-03-05 DIAGNOSIS — M79675 Pain in left toe(s): Secondary | ICD-10-CM | POA: Diagnosis not present

## 2023-03-05 DIAGNOSIS — I739 Peripheral vascular disease, unspecified: Secondary | ICD-10-CM

## 2023-03-05 NOTE — Progress Notes (Signed)
Subjective:   Patient ID: Rick Melendez, male   DOB: 87 y.o.   MRN: [id number]   HPI Chief Complaint  Patient presents with   routinew foot care    dfc nail trim - nails are very hard / feet are constantly cold & purplish in color  87 year old male presents for above concerns.  He has not seen any swelling or redness or any drainage to the toenail sites.  With concern about peripheral discoloration to his feet.  Symptoms ongoing for quite some time.  No open lesions.  He has right iliac disease and is being followed by Dr. Gwenlyn Found.   Deland Pretty, MD- last seen 11/2022  Sugar around 100 or 105.    Review of Systems  All other systems reviewed and are negative.  Past Medical History:  Diagnosis Date   Arthritis    Coronary artery disease 2007   cabg x3   Diabetes mellitus without complication ([place])    Hyperlipidemia    Hypertension    Influenza B 03/19/2017   OSA on CPAP    Dr Claiborne Billings - follows and treatmentof sleep apnea   PVD (peripheral vascular disease) ([place]) 2003   DR BERRY-  -LEFT LEG STENTING    Past Surgical History:  Procedure Laterality Date   CARDIAC CATHETERIZATION  0911/2007   3 vessel CAD WITH LEFT MAIN CAD SEVERE ,norm lLIMA,LV nom. ,evidence for very mild aortic stenosis with gradient, mild stenosis distal abdnminal aotra and proximal left common iliac artery 30%   CORONARY ARTERY BYPASS GRAFT  08/31/2006   DR GERHARDT---LIMA to LAD, VEIN TO AN OBTUSE MARGINAL BRANCH AND  RIGHT CORONARY ARTERY   CPET  04/13/2012   normal   DOPPLER ECHOCARDIOGRAPHY  07/08/2011   EF =>55%   lower arterial doppler  07/01/2012   mildly abnormal lower extremity;ABIs >1 bilaterally with moderately high-grade right iliac stenosis that had not changed from proir study.   NM MYOCAR PERF WALL MOTION  7/182012   EF 59%, normal myocardial perfusio     Current Outpatient Medications:    aspirin EC 81 MG tablet, Take 81 mg by mouth daily., Disp: , Rfl:    clopidogrel (PLAVIX) 75 MG  tablet, Take 75 mg by mouth daily., Disp: , Rfl:    Coenzyme Q10 (COQ10) 200 MG CAPS, Take 200 mg by mouth daily., Disp: 90 capsule, Rfl: 3   ezetimibe (ZETIA) 10 MG tablet, Take 10 mg by mouth daily., Disp: , Rfl:    FARXIGA 10 MG TABS tablet, Take 10 mg by mouth daily., Disp: , Rfl:    furosemide (LASIX) 20 MG tablet, Take 1 tablet by mouth daily., Disp: , Rfl: 5   gabapentin (NEURONTIN) 300 MG capsule, Take 1 capsule by mouth at night for 7 nights and then 1 in the morning and one at night for 7 days and then 3 times a day., Disp: 90 capsule, Rfl: 1   glipiZIDE (GLUCOTROL) 5 MG tablet, Take 5 mg by mouth daily., Disp: , Rfl:    HYDROcodone-acetaminophen (NORCO/VICODIN) 5-325 MG per tablet, Take 1 tablet by mouth 3 (three) times daily., Disp: , Rfl: 0   lisinopril (ZESTRIL) 5 MG tablet, Take 5 mg by mouth daily., Disp: , Rfl:    memantine (NAMENDA) 5 MG tablet, Take 1 tablet (5 mg at night) for 2 weeks, then increase to 1 tablet (5 mg) twice a day, Disp: 180 each, Rfl: 11   metoprolol tartrate (LOPRESSOR) 25 MG tablet, Take 12.5 mg by mouth  daily. , Disp: , Rfl:    Omega-3 Fatty Acids (OMEGA-3 FISH OIL) 1200 MG CAPS, Take 2,400 mg by mouth 2 (two) times daily. Take 4 capsules daily, Disp: , Rfl:    omeprazole (PRILOSEC) 20 MG capsule, Take 1 capsule by mouth daily., Disp: , Rfl: 0   ONETOUCH VERIO test strip, U FOR GLUCOSE TESTING TID, Disp: , Rfl: 1   OVER THE COUNTER MEDICATION, USES CPAP NIGHTLY, Disp: , Rfl:    rosuvastatin (CRESTOR) 10 MG tablet, Take 10 mg by mouth at bedtime., Disp: , Rfl: 0   traMADol (ULTRAM) 50 MG tablet, Take 50-100 mg by mouth 3 (three) times daily., Disp: , Rfl:   Allergies  Allergen Reactions   Ozempic (0.25 Or 0.5 Mg-Dose) [Semaglutide(0.25 Or 0.5mg -Dos)] Other (See Comments)    Hallucinations, lethargic           Objective:  Physical Exam  General: AAO x3, NAD  Dermatological: Nails are hypertrophic, dystrophic, brittle, discolored, elongated 10. No  surrounding redness or drainage. Tenderness nails 1-5 bilaterally. No open lesions or pre-ulcerative lesions are identified today.  Vascular: Dorsalis Pedis artery and Posterior Tibial artery pedal pulses are palpable bilateral with immedate capillary fill time.  Venous insufficiency noted.  There is no pain with calf compression, swelling, warmth, erythema.   Neruologic: Grossly intact via light touch bilateral.   Musculoskeletal: No other areas of discomfort.     Assessment:   Symptomatic onychomycosis     Plan:  -Treatment options discussed including all alternatives, risks, and complications -Etiology of symptoms were discussed -Nails debrided 10 without complications or bleeding. -He has palpable pulses.  He has had ABI July of last year and is stable.  Continue to monitor. -Daily foot inspection -Follow-up in 3 months or sooner if any problems arise. In the meantime, encouraged to call the office with any questions, concerns, change in symptoms.   Celesta Gentile, DPM

## 2023-03-10 DIAGNOSIS — Z951 Presence of aortocoronary bypass graft: Secondary | ICD-10-CM | POA: Diagnosis not present

## 2023-03-10 DIAGNOSIS — Z7902 Long term (current) use of antithrombotics/antiplatelets: Secondary | ICD-10-CM | POA: Diagnosis not present

## 2023-03-10 DIAGNOSIS — E7800 Pure hypercholesterolemia, unspecified: Secondary | ICD-10-CM | POA: Diagnosis not present

## 2023-03-10 DIAGNOSIS — I10 Essential (primary) hypertension: Secondary | ICD-10-CM | POA: Diagnosis not present

## 2023-03-10 DIAGNOSIS — E1169 Type 2 diabetes mellitus with other specified complication: Secondary | ICD-10-CM | POA: Diagnosis not present

## 2023-03-10 DIAGNOSIS — N1831 Chronic kidney disease, stage 3a: Secondary | ICD-10-CM | POA: Diagnosis not present

## 2023-03-31 ENCOUNTER — Telehealth: Payer: Self-pay | Admitting: Orthopedic Surgery

## 2023-03-31 NOTE — Telephone Encounter (Signed)
FYI.Marland KitchenMarland KitchenMarland KitchenPatient called in stating he was over charged for a visit Lajoyce Corners Dictated after the initial appt and the patient was charged double like it was 2 office visits on 2 separate occasions once in October of 2023 which she overpaid and back in January of 2024 which she just got a bill for an extra $30 amount, an email is being sent out to Toniann Fail and Drinda Butts to send over to billing to work on crediting her account the $30 and take away the January $30 charge.

## 2023-04-08 ENCOUNTER — Ambulatory Visit: Payer: Medicare Other | Admitting: Orthopedic Surgery

## 2023-04-08 ENCOUNTER — Encounter: Payer: Self-pay | Admitting: Orthopedic Surgery

## 2023-04-08 DIAGNOSIS — M170 Bilateral primary osteoarthritis of knee: Secondary | ICD-10-CM

## 2023-04-08 NOTE — Progress Notes (Signed)
Office Visit Note   Patient: Rick Melendez           Date of Birth: August 15, 1936           MRN: [id number] Visit Date: 04/08/2023              Requested by: Merri Brunette, MD [address] [place],  [place] [address] PCP: Merri Brunette, MD  Chief Complaint  Patient presents with   Left Knee - Follow-up    S/p bilat knee injections 01/07/2023   Right Knee - Follow-up      HPI: Patient is an 87 year old gentleman with osteoarthritis bilateral knees.  Patient states his last injection lasted about 2 months.  He has pain with ambulation ambulates with a cane.  Assessment & Plan: Visit Diagnoses:  1. Bilateral primary osteoarthritis of knee     Plan: Both knees were injected he tolerated this well.  Discussed that if patient wants to consider total knee arthroplasty he will need to have cardiac clearance and we would set him up for Pre rehab upstairs.  Follow-Up Instructions: No follow-ups on file.   Ortho Exam  Patient is alert, oriented, no adenopathy, well-dressed, normal affect, normal respiratory effort. Examination patient has a varus alignment of both knees difficulty getting from a sitting to a standing position.  There is crepitation with range of motion collaterals and cruciates are stable there is no effusion.  Imaging: No results found. No images are attached to the encounter.  Labs: Lab Results  Component Value Date   REPTSTATUS 03/24/2017 FINAL 03/19/2017   CULT NO GROWTH 5 DAYS 03/19/2017     No results found for: "ALBUMIN", "PREALBUMIN", "CBC"  Lab Results  Component Value Date   MG 1.5 (L) 03/19/2017   No results found for: "VD25OH"  No results found for: "PREALBUMIN"    Latest Ref Rng & Units 03/20/2017    4:43 AM 03/19/2017   12:47 PM  CBC EXTENDED  WBC 4.0 - 10.5 K/uL 8.4  10.9   RBC 4.22 - 5.81 MIL/uL 4.90  5.54   Hemoglobin 13.0 - 17.0 g/dL 19.1  47.8   HCT 29.5 - 52.0 % 40.3  45.6   Platelets 150 - 400 K/uL 146  177    NEUT# 1.7 - 7.7 K/uL  7.5   Lymph# 0.7 - 4.0 K/uL  1.7      There is no height or weight on file to calculate BMI.  Orders:  No orders of the defined types were placed in this encounter.  No orders of the defined types were placed in this encounter.    Procedures: Large Joint Inj: bilateral knee on 04/08/2023 12:56 PM Indications: pain and diagnostic evaluation Details: 22 G 1.5 in needle, anteromedial approach  Arthrogram: No  Outcome: tolerated well, no immediate complications Procedure, treatment alternatives, risks and benefits explained, specific risks discussed. Consent was given by the patient. Immediately prior to procedure a time out was called to verify the correct patient, procedure, equipment, support staff and site/side marked as required. Patient was prepped and draped in the usual sterile fashion.      Clinical Data: No additional findings.  ROS:  All other systems negative, except as noted in the HPI. Review of Systems  Objective: Vital Signs: There were no vitals taken for this visit.  Specialty Comments:  EXAM: MRI LUMBAR SPINE WITHOUT CONTRAST   TECHNIQUE: Multiplanar, multisequence MR imaging of the lumbar spine was performed. No intravenous contrast was administered.  COMPARISON:  Lumbar MRI 05/27/2014   FINDINGS: Segmentation:  Normal.  Lowest disc space L5-S1   Alignment: Mild anterolisthesis T12-L1. 4 mm retrolisthesis L1-2. Slight retrolisthesis L3-4. 6 mm anterolisthesis L4-5. Mild retrolisthesis L5-S1.   Vertebrae:  Normal bone marrow.  Negative for fracture or mass.   Conus medullaris and cauda equina: Conus extends to the L1-2 level. Conus and cauda equina appear normal.   Paraspinal and other soft tissues: Negative for mass or adenopathy. No soft tissue edema or fluid collection.   Disc levels:   T12-L1: Advanced disc degeneration left greater than right. Prominent left-sided spurring. Bilateral facet  degeneration. Moderate spinal stenosis. Severe subarticular and foraminal stenosis on the left. Mild subarticular stenosis on the right. Mild progression of stenosis on the left since the prior study   L1-2: Advanced disc degeneration with diffuse endplate spurring and marked disc space narrowing. Mild facet degeneration. Mild to moderate spinal stenosis. Moderate subarticular stenosis bilaterally. No interval change   L2-3: Disc degeneration with diffuse disc bulging and endplate spurring. Mild facet degeneration. Mild subarticular stenosis bilaterally with mild progression   L3-4: Disc degeneration with diffuse disc bulging and spurring, right greater than left. Mild facet degeneration. Mild spinal stenosis. Moderate to severe right subarticular stenosis. Moderate left subarticular stenosis. No significant change   L4-5: 6 mm anterolisthesis with severe facet degeneration and diffuse bulging of the disc. Severe spinal stenosis and severe subarticular stenosis bilaterally have progressed since the prior study. In addition, there is severe right foraminal encroachment with right L4 nerve root impingement and moderate left foraminal encroachment.   L5-S1: Left paracentral disc protrusion with compression of the left S1 nerve root similar to the prior study. Mild facet degeneration bilaterally.   IMPRESSION: Advanced multilevel degenerative change throughout the lumbar spine. There has been progression of degenerative changes stenosis at multiple levels as described above   6 mm anterolisthesis L4-5. Severe spinal and severe subarticular stenosis bilaterally have progressed in the interval. Severe right foraminal encroachment.     Electronically Signed   By: Marlan Palau M.D.   On: 08/22/2020 21:23  PMFS History: Patient Active Problem List   Diagnosis Date Noted   Slow heart rate 04/14/2022   Influenza B    AKI (acute kidney injury)    CAP (community acquired  pneumonia) 03/19/2017   Bilateral primary osteoarthritis of knee 01/28/2017   Impingement syndrome of right shoulder 01/28/2017   CAD - CABG X 2 9/07 LIMA-LAD, SVG-OM. Low risk Myoview 7/12 09/07/2013   HTN (hypertension) 09/07/2013   Dyslipidemia 09/07/2013   Diabetes mellitus (HCC) 09/07/2013   Chronic renal insufficiency, stage III (moderate)- SCr 1.6 (Aug 2013) 09/07/2013   Sleep apnea- compliant with C-pap 09/07/2013   PVD (peripheral vascular disease)- Rt iliac disease by doppler 7/13 09/07/2013   Past Medical History:  Diagnosis Date   Arthritis    Coronary artery disease 2007   cabg x3   Diabetes mellitus without complication    Hyperlipidemia    Hypertension    Influenza B 03/19/2017   OSA on CPAP    Dr Tresa Endo - follows and treatmentof sleep apnea   PVD (peripheral vascular disease) 2003   DR BERRY-  -LEFT LEG STENTING    Family History  Problem Relation Age of Onset   Cancer Father    Cancer Brother     Past Surgical History:  Procedure Laterality Date   CARDIAC CATHETERIZATION  0911/2007   3 vessel CAD WITH LEFT MAIN CAD SEVERE ,norm lLIMA,LV  nom. ,evidence for very mild aortic stenosis with gradient, mild stenosis distal abdnminal aotra and proximal left common iliac artery 30%   CORONARY ARTERY BYPASS GRAFT  08/31/2006   DR GERHARDT---LIMA to LAD, VEIN TO AN OBTUSE MARGINAL BRANCH AND  RIGHT CORONARY ARTERY   CPET  04/13/2012   normal   DOPPLER ECHOCARDIOGRAPHY  07/08/2011   EF =>55%   lower arterial doppler  07/01/2012   mildly abnormal lower extremity;ABIs >1 bilaterally with moderately high-grade right iliac stenosis that had not changed from proir study.   NM MYOCAR PERF WALL MOTION  7/182012   EF 59%, normal myocardial perfusio   Social History   Occupational History   Not on file  Tobacco Use   Smoking status: Former    Types: Cigarettes    Quit date: 09/03/1981    Years since quitting: 41.6   Smokeless tobacco: Current    Types: Chew  Substance  and Sexual Activity   Alcohol use: Yes   Drug use: No   Sexual activity: Not on file

## 2023-04-14 DIAGNOSIS — E1169 Type 2 diabetes mellitus with other specified complication: Secondary | ICD-10-CM | POA: Diagnosis not present

## 2023-05-05 ENCOUNTER — Ambulatory Visit: Payer: Medicare Other | Attending: Cardiovascular Disease | Admitting: Cardiovascular Disease

## 2023-05-05 ENCOUNTER — Encounter: Payer: Self-pay | Admitting: Cardiovascular Disease

## 2023-05-05 VITALS — BP 112/52 | HR 46 | Ht 67.0 in | Wt 198.0 lb

## 2023-05-05 DIAGNOSIS — I2510 Atherosclerotic heart disease of native coronary artery without angina pectoris: Secondary | ICD-10-CM | POA: Diagnosis not present

## 2023-05-05 DIAGNOSIS — G4733 Obstructive sleep apnea (adult) (pediatric): Secondary | ICD-10-CM

## 2023-05-05 DIAGNOSIS — I739 Peripheral vascular disease, unspecified: Secondary | ICD-10-CM | POA: Diagnosis not present

## 2023-05-05 DIAGNOSIS — I10 Essential (primary) hypertension: Secondary | ICD-10-CM | POA: Diagnosis not present

## 2023-05-05 DIAGNOSIS — E785 Hyperlipidemia, unspecified: Secondary | ICD-10-CM

## 2023-05-05 DIAGNOSIS — Z95828 Presence of other vascular implants and grafts: Secondary | ICD-10-CM | POA: Diagnosis not present

## 2023-05-05 NOTE — Assessment & Plan Note (Signed)
History of dyslipidemia on and rosuvastatin with lipid profile performed 09/01/2021 revealing total cholesterol 92, LDL 32 and HDL 21.

## 2023-05-05 NOTE — Assessment & Plan Note (Signed)
History of peripheral arterial disease status post left common iliac artery stenting by Dr. Samuella Cota in [place] 2003 06/26/2022 revealing a patent left iliac stent with moderate iliac disease bilaterally although he denies claudication.  His major issues are arthritic with his knees.  He had Dopplers performed 06/26/2022 revealing a patent left iliac stent with moderate iliac disease bilaterally.

## 2023-05-05 NOTE — Progress Notes (Signed)
05/05/2023 Rick Melendez   February 26, 1936  [id number]  Primary Physician Merri Brunette, MD Primary Cardiologist: Runell Gess MD FACP, [place], [place], [place]  HPI:  Rick Melendez is a 87 y.o.   severely obese, married Caucasian male, father of 2, grandfather to 2 grandchildren who I last saw in the office 04/14/2022.  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 He doesn't do any regular exercise but he active around the farm with chores. He has a remote history tobacco abuse, having quit 20 years ago, as well as, treated hypertension and hyperlipidemia, and diabetes.    He has CAD and had left main 2-vessel disease by cath August 31, 2006, and underwent coronary artery bypass grafting x2 by Dr. Ofilia Neas with a LIMA to his LAD, a vein to an obtuse marginal branch and right coronary arter. He has also had stenting of his left leg back in 2003, doppler in July 2013 showed Rt iliac disease but he has been asymptomatic. He denies chest pain or shortness of breath. He does have daytime fatigue and this is unchanged. He has had some leg pain which he attributes to statin Rx. His last Myoview was performed 07/06/13 was nonischemic.Rick Melendez He sees Dr. Tresa Endo f for obstructive sleep apnea.  He is on CPAP supposedly but unfortunately is not wearing this.     Since I saw him in the office a year ago he continues to do well.  He denies chest pain , shortness of breath or claudication.   Current Meds  Medication Sig   aspirin EC 81 MG tablet Take 81 mg by mouth daily.   clopidogrel (PLAVIX) 75 MG tablet Take 75 mg by mouth daily.   Cyanocobalamin 3000 MCG SUBL as directed Sublingual   ezetimibe (ZETIA) 10 MG tablet Take 10 mg by mouth daily.   FARXIGA 10 MG TABS tablet Take 10 mg by mouth daily.   furosemide (LASIX) 20 MG tablet Take 1 tablet by mouth daily.   KERENDIA 10 MG TABS Take 10 mg by  mouth daily.   lisinopril (ZESTRIL) 5 MG tablet Take 5 mg by mouth daily.   memantine (NAMENDA) 5 MG tablet Take 1 tablet (5 mg at night) for 2 weeks, then increase to 1 tablet (5 mg) twice a day   metoprolol tartrate (LOPRESSOR) 25 MG tablet Take 12.5 mg by mouth daily.    omeprazole (PRILOSEC) 20 MG capsule Take 1 capsule by mouth daily.   ONETOUCH VERIO test strip U FOR GLUCOSE TESTING TID   OVER THE COUNTER MEDICATION USES CPAP NIGHTLY   rosuvastatin (CRESTOR) 10 MG tablet Take 10 mg by mouth at bedtime.   traMADol (ULTRAM) 50 MG tablet Take 50-100 mg by mouth 3 (three) times daily.     Allergies  Allergen Reactions   Ozempic (0.25 Or 0.5 Mg-Dose) [Semaglutide(0.25 Or 0.5mg -Dos)] Other (See Comments)    Hallucinations, lethargic     Social History   Socioeconomic History   Marital status: Married    Spouse name: Not on file   Number of children: Not on file   Years of education: Not on file   Highest education level: Not on file  Occupational History   Not on file  Tobacco Use   Smoking status: Former    Types: Cigarettes    Quit date: 09/03/1981    Years since  quitting: 41.6   Smokeless tobacco: Current    Types: Chew  Substance and Sexual Activity   Alcohol use: Yes   Drug use: No   Sexual activity: Not on file  Other Topics Concern   Not on file  Social History Narrative   Right handed   Drinks coffe   One floor home   Social Determinants of Health   Financial Resource Strain: Not on file  Food Insecurity: Not on file  Transportation Needs: Not on file  Physical Activity: Not on file  Stress: Not on file  Social Connections: Not on file  Intimate Partner Violence: Not on file     Review of Systems: General: negative for chills, fever, night sweats or weight changes.  Cardiovascular: negative for chest pain, dyspnea on exertion, edema, orthopnea, palpitations, paroxysmal nocturnal dyspnea or shortness of breath Dermatological: negative for  rash Respiratory: negative for cough or wheezing Urologic: negative for hematuria Abdominal: negative for nausea, vomiting, diarrhea, bright red blood per rectum, melena, or hematemesis Neurologic: negative for visual changes, syncope, or dizziness All other systems reviewed and are otherwise negative except as noted above.    Blood pressure (!) 112/52, pulse (!) 46, height 5\' 7"  (1.702 m), weight 198 lb (89.8 kg), SpO2 96 %.  General appearance: alert and no distress Neck: no adenopathy, no carotid bruit, no JVD, supple, symmetrical, trachea midline, and thyroid not enlarged, symmetric, no tenderness/mass/nodules Lungs: clear to auscultation bilaterally Heart: regular rate and rhythm, S1, S2 normal, no murmur, click, rub or gallop Extremities: extremities normal, atraumatic, no cyanosis or edema Pulses: 2+ and symmetric Skin: Skin color, texture, turgor normal. No rashes or lesions Neurologic: Grossly normal  EKG sinus bradycardia at 46 with incomplete left bundle branch block.  I personally reviewed this EKG.  ASSESSMENT AND PLAN:   CAD - CABG X 2 9/07 LIMA-LAD, SVG-OM. Low risk Myoview 7/12 History of CAD status post coronary artery bypass grafting x 2 by Dr. Ofilia Neas 08/31/2006 with a LIMA to his LAD and a vein to an obtuse marginal branch.  He had a Myoview performed 07/06/2013 was nonischemic.  He is completely asymptomatic.  HTN (hypertension) History of essential hypertension blood pressure measured today at 112/52.  He is on metoprolol.  Dyslipidemia History of dyslipidemia on and rosuvastatin with lipid profile performed 09/01/2021 revealing total cholesterol 92, LDL 32 and HDL 21.  Sleep apnea- compliant with C-pap History of obstructive sleep apnea no longer wearing CPAP  PVD (peripheral vascular disease)- Rt iliac disease by doppler 7/13 History of peripheral arterial disease status post left common iliac artery stenting by Dr. Samuella Cota in [place] 2003 06/26/2022 revealing a  patent left iliac stent with moderate iliac disease bilaterally although he denies claudication.  His major issues are arthritic with his knees.  He had Dopplers performed 06/26/2022 revealing a patent left iliac stent with moderate iliac disease bilaterally.     Runell Gess MD FACP,FACC,FAHA, [facility] 05/05/2023 2:51 PM

## 2023-05-05 NOTE — Assessment & Plan Note (Signed)
History of obstructive sleep apnea no longer wearing CPAP

## 2023-05-05 NOTE — Patient Instructions (Signed)
Medication Instructions:  Your physician recommends that you continue on your current medications as directed. Please refer to the Current Medication list given to you today.  *If you need a refill on your cardiac medications before your next appointment, please call your pharmacy*    Testing/Procedures: Your physician has requested that you have an Aorta/Iliac Duplex. This will be take place at [address] [facility], [address].  No food after 11PM the night before.  Water is OK. (Don't drink liquids if you have been instructed not to for ANOTHER test) Avoid foods that produce bowel gas, for 24 hours prior to exam (see below). No breakfast, no chewing gum, no smoking or carbonated beverages. Patient may take morning medications with water. Come in for test at least 15 minutes early to register. To be done in July  Your physician has requested that you have an ankle brachial index (ABI). During this test an ultrasound and blood pressure cuff are used to evaluate the arteries that supply the arms and legs with blood. Allow thirty minutes for this exam. There are no restrictions or special instructions. This will take place at [address], [address]. To be done in July.   Follow-Up: At [facility], you and your health needs are our priority.  As part of our continuing mission to provide you with exceptional heart care, we have created designated Provider Care Teams.  These Care Teams include your primary Cardiologist (physician) and Advanced Practice Providers (APPs -  Physician Assistants and Nurse Practitioners) who all work together to provide you with the care you need, when you need it.  We recommend signing up for the patient portal called "MyChart".  Sign up information is provided on this After Visit Summary.  MyChart is used to connect with patients for Virtual Visits (Telemedicine).  Patients are able to view lab/test results, encounter notes, upcoming appointments, etc.   Non-urgent messages can be sent to your provider as well.   To learn more about what you can do with MyChart, go to [email or web address].    Your next appointment:   12 month(s)  Provider:   Nanetta Batty, MD

## 2023-05-05 NOTE — Assessment & Plan Note (Signed)
History of essential hypertension blood pressure measured today at 112/52.  He is on metoprolol.

## 2023-05-05 NOTE — Assessment & Plan Note (Signed)
History of CAD status post coronary artery bypass grafting x 2 by Dr. Ofilia Neas 08/31/2006 with a LIMA to his LAD and a vein to an obtuse marginal branch.  He had a Myoview performed 07/06/2013 was nonischemic.  He is completely asymptomatic.

## 2023-05-21 DIAGNOSIS — M545 Low back pain, unspecified: Secondary | ICD-10-CM | POA: Diagnosis not present

## 2023-05-21 DIAGNOSIS — N1831 Chronic kidney disease, stage 3a: Secondary | ICD-10-CM | POA: Diagnosis not present

## 2023-06-03 DIAGNOSIS — J069 Acute upper respiratory infection, unspecified: Secondary | ICD-10-CM | POA: Diagnosis not present

## 2023-06-07 ENCOUNTER — Ambulatory Visit: Payer: Medicare Other | Admitting: Podiatry

## 2023-06-07 DIAGNOSIS — B351 Tinea unguium: Secondary | ICD-10-CM

## 2023-06-07 DIAGNOSIS — M79675 Pain in left toe(s): Secondary | ICD-10-CM | POA: Diagnosis not present

## 2023-06-07 DIAGNOSIS — M79674 Pain in right toe(s): Secondary | ICD-10-CM | POA: Diagnosis not present

## 2023-06-07 DIAGNOSIS — Z7901 Long term (current) use of anticoagulants: Secondary | ICD-10-CM | POA: Diagnosis not present

## 2023-06-09 NOTE — Progress Notes (Signed)
Subjective: Chief Complaint  Patient presents with   Nail Problem    RFC   87 year old male presents the office with above concerns.  States he needs his nails trimmed as are thickened discolored he is not able to do himself and are causing discomfort.  No open lesions.  No injuries.  He is on Plavix.   Objective: AAO x3, NAD DP/PT pulses palpable bilaterally, CRT less than 3 seconds Nails are hypertrophic, dystrophic, brittle, discolored, elongated 10. No surrounding redness or drainage. Tenderness nails 1-5 bilaterally. No open lesions or pre-ulcerative lesions are identified today. No pain with calf compression, swelling, warmth, erythema  Assessment: Symptomatic onychomycosis  Plan: -All treatment options discussed with the patient including all alternatives, risks, complications.  -Toenails sharply debrided x 10 without any complications or bleeding. -Patient encouraged to call the office with any questions, concerns, change in symptoms.   Vivi Barrack DPM

## 2023-07-05 ENCOUNTER — Ambulatory Visit (HOSPITAL_COMMUNITY)
Admission: RE | Admit: 2023-07-05 | Discharge: 2023-07-05 | Disposition: A | Discharge status: Home / Self Care | Payer: Medicare Other | Source: Ambulatory Visit | Attending: Cardiovascular Disease | Admitting: Cardiovascular Disease

## 2023-07-05 ENCOUNTER — Ambulatory Visit (HOSPITAL_COMMUNITY): Admission: RE | Admit: 2023-07-05 | Payer: Medicare Other | Source: Ambulatory Visit

## 2023-07-05 DIAGNOSIS — I2510 Atherosclerotic heart disease of native coronary artery without angina pectoris: Secondary | ICD-10-CM | POA: Diagnosis not present

## 2023-07-05 DIAGNOSIS — Z95828 Presence of other vascular implants and grafts: Secondary | ICD-10-CM | POA: Diagnosis not present

## 2023-07-05 DIAGNOSIS — I739 Peripheral vascular disease, unspecified: Secondary | ICD-10-CM

## 2023-07-05 LAB — VAS US ABI WITH/WO TBI: Right ABI: 1.06

## 2023-07-06 ENCOUNTER — Ambulatory Visit: Payer: Medicare Other | Admitting: Physician Assistant

## 2023-07-06 LAB — VAS US ABI WITH/WO TBI: Left ABI: 1.01

## 2023-07-07 DIAGNOSIS — R053 Chronic cough: Secondary | ICD-10-CM | POA: Diagnosis not present

## 2023-07-07 DIAGNOSIS — R062 Wheezing: Secondary | ICD-10-CM | POA: Diagnosis not present

## 2023-07-08 ENCOUNTER — Ambulatory Visit: Payer: Medicare Other | Admitting: Orthopedic Surgery

## 2023-07-08 ENCOUNTER — Encounter: Payer: Self-pay | Admitting: Orthopedic Surgery

## 2023-07-08 DIAGNOSIS — M1712 Unilateral primary osteoarthritis, left knee: Secondary | ICD-10-CM

## 2023-07-08 DIAGNOSIS — M170 Bilateral primary osteoarthritis of knee: Secondary | ICD-10-CM

## 2023-07-08 DIAGNOSIS — M1711 Unilateral primary osteoarthritis, right knee: Secondary | ICD-10-CM

## 2023-07-08 NOTE — Progress Notes (Signed)
Office Visit Note   Patient: Rick Melendez           Date of Birth: [birth year]           MRN: [id number] Visit Date: 07/08/2023              Requested by: Merri Brunette, MD [address] [place],  [place] [address] PCP: Merri Brunette, MD  Chief Complaint  Patient presents with   Right Knee - Follow-up    BILAT KNEE INJECTIONS 04/08/2023   Left Knee - Follow-up      HPI: Patient is an 87 year old gentleman presents in follow-up for osteoarthritis bilateral knees.  Patient states he has had gel injections in the past that have also provided some temporary relief.  Assessment & Plan: Visit Diagnoses:  1. Bilateral primary osteoarthritis of knee     Plan: Both knees were injected he tolerated this well he will call if he needs repeat steroid injection or would like to proceed with a gel injection.  Follow-Up Instructions: No follow-ups on file.   Ortho Exam  Patient is alert, oriented, no adenopathy, well-dressed, normal affect, normal respiratory effort. Examination patient has an antalgic gait he uses a cane he has effusion of both knees there is no redness no cellulitis.  He has crepitation with range of motion and tenderness to palpation.  Imaging: No results found. No images are attached to the encounter.  Labs: Lab Results  Component Value Date   REPTSTATUS 03/24/2017 FINAL 03/19/2017   CULT NO GROWTH 5 DAYS 03/19/2017     No results found for: "ALBUMIN", "PREALBUMIN", "CBC"  Lab Results  Component Value Date   MG 1.5 (L) 03/19/2017   No results found for: "VD25OH"  No results found for: "PREALBUMIN"    Latest Ref Rng & Units 03/20/2017    4:43 AM 03/19/2017   12:47 PM  CBC EXTENDED  WBC 4.0 - 10.5 K/uL 8.4  10.9   RBC 4.22 - 5.81 MIL/uL 4.90  5.54   Hemoglobin 13.0 - 17.0 g/dL 62.1  30.8   HCT 65.7 - 52.0 % 40.3  45.6   Platelets 150 - 400 K/uL 146  177   NEUT# 1.7 - 7.7 K/uL  7.5   Lymph# 0.7 - 4.0 K/uL  1.7      There is no  height or weight on file to calculate BMI.  Orders:  No orders of the defined types were placed in this encounter.  No orders of the defined types were placed in this encounter.    Procedures: Large Joint Inj: bilateral knee on 07/08/2023 12:12 PM Indications: pain and diagnostic evaluation Details: 22 G 1.5 in needle, anteromedial approach  Arthrogram: No  Outcome: tolerated well, no immediate complications Procedure, treatment alternatives, risks and benefits explained, specific risks discussed. Consent was given by the patient. Immediately prior to procedure a time out was called to verify the correct patient, procedure, equipment, support staff and site/side marked as required. Patient was prepped and draped in the usual sterile fashion.      Clinical Data: No additional findings.  ROS:  All other systems negative, except as noted in the HPI. Review of Systems  Objective: Vital Signs: There were no vitals taken for this visit.  Specialty Comments:  EXAM: MRI LUMBAR SPINE WITHOUT CONTRAST   TECHNIQUE: Multiplanar, multisequence MR imaging of the lumbar spine was performed. No intravenous contrast was administered.   COMPARISON:  Lumbar MRI 05/27/2014   FINDINGS: Segmentation:  Normal.  Lowest disc space L5-S1   Alignment: Mild anterolisthesis T12-L1. 4 mm retrolisthesis L1-2. Slight retrolisthesis L3-4. 6 mm anterolisthesis L4-5. Mild retrolisthesis L5-S1.   Vertebrae:  Normal bone marrow.  Negative for fracture or mass.   Conus medullaris and cauda equina: Conus extends to the L1-2 level. Conus and cauda equina appear normal.   Paraspinal and other soft tissues: Negative for mass or adenopathy. No soft tissue edema or fluid collection.   Disc levels:   T12-L1: Advanced disc degeneration left greater than right. Prominent left-sided spurring. Bilateral facet degeneration. Moderate spinal stenosis. Severe subarticular and foraminal stenosis on the left.  Mild subarticular stenosis on the right. Mild progression of stenosis on the left since the prior study   L1-2: Advanced disc degeneration with diffuse endplate spurring and marked disc space narrowing. Mild facet degeneration. Mild to moderate spinal stenosis. Moderate subarticular stenosis bilaterally. No interval change   L2-3: Disc degeneration with diffuse disc bulging and endplate spurring. Mild facet degeneration. Mild subarticular stenosis bilaterally with mild progression   L3-4: Disc degeneration with diffuse disc bulging and spurring, right greater than left. Mild facet degeneration. Mild spinal stenosis. Moderate to severe right subarticular stenosis. Moderate left subarticular stenosis. No significant change   L4-5: 6 mm anterolisthesis with severe facet degeneration and diffuse bulging of the disc. Severe spinal stenosis and severe subarticular stenosis bilaterally have progressed since the prior study. In addition, there is severe right foraminal encroachment with right L4 nerve root impingement and moderate left foraminal encroachment.   L5-S1: Left paracentral disc protrusion with compression of the left S1 nerve root similar to the prior study. Mild facet degeneration bilaterally.   IMPRESSION: Advanced multilevel degenerative change throughout the lumbar spine. There has been progression of degenerative changes stenosis at multiple levels as described above   6 mm anterolisthesis L4-5. Severe spinal and severe subarticular stenosis bilaterally have progressed in the interval. Severe right foraminal encroachment.     Electronically Signed   By: Marlan Palau M.D.   On: 08/22/2020 21:23  PMFS History: Patient Active Problem List   Diagnosis Date Noted   Slow heart rate 04/14/2022   Influenza B    AKI (acute kidney injury) (HCC)    CAP (community acquired pneumonia) 03/19/2017   Bilateral primary osteoarthritis of knee 01/28/2017   Impingement  syndrome of right shoulder 01/28/2017   CAD - CABG X 2 9/07 LIMA-LAD, SVG-OM. Low risk Myoview 7/12 09/07/2013   HTN (hypertension) 09/07/2013   Dyslipidemia 09/07/2013   Diabetes mellitus (HCC) 09/07/2013   Chronic renal insufficiency, stage III (moderate)- SCr 1.6 (Aug 2013) 09/07/2013   Sleep apnea- compliant with C-pap 09/07/2013   PVD (peripheral vascular disease)- Rt iliac disease by doppler 7/13 09/07/2013   Past Medical History:  Diagnosis Date   Arthritis    Coronary artery disease 2007   cabg x3   Diabetes mellitus without complication (HCC)    Hyperlipidemia    Hypertension    Influenza B 03/19/2017   OSA on CPAP    Dr Tresa Endo - follows and treatmentof sleep apnea   PVD (peripheral vascular disease) (HCC) 2003   DR BERRY-  -LEFT LEG STENTING    Family History  Problem Relation Age of Onset   Cancer Father    Cancer Brother     Past Surgical History:  Procedure Laterality Date   CARDIAC CATHETERIZATION  0911/2007   3 vessel CAD WITH LEFT MAIN CAD SEVERE ,norm lLIMA,LV nom. ,evidence for very mild aortic stenosis  with gradient, mild stenosis distal abdnminal aotra and proximal left common iliac artery 30%   CORONARY ARTERY BYPASS GRAFT  08/31/2006   DR GERHARDT---LIMA to LAD, VEIN TO AN OBTUSE MARGINAL BRANCH AND  RIGHT CORONARY ARTERY   CPET  04/13/2012   normal   DOPPLER ECHOCARDIOGRAPHY  07/08/2011   EF =>55%   lower arterial doppler  07/01/2012   mildly abnormal lower extremity;ABIs >1 bilaterally with moderately high-grade right iliac stenosis that had not changed from proir study.   NM MYOCAR PERF WALL MOTION  7/182012   EF 59%, normal myocardial perfusio   Social History   Occupational History   Not on file  Tobacco Use   Smoking status: Former    Current packs/day: 0.00    Types: Cigarettes    Quit date: 09/03/1981    Years since quitting: 41.8   Smokeless tobacco: Current    Types: Chew  Substance and Sexual Activity   Alcohol use: Yes   Drug use:  No   Sexual activity: Not on file

## 2023-07-16 ENCOUNTER — Encounter: Payer: Self-pay | Admitting: Physician Assistant

## 2023-07-16 ENCOUNTER — Ambulatory Visit: Payer: Medicare Other | Admitting: Physician Assistant

## 2023-07-16 VITALS — BP 112/68 | HR 74 | Resp 18 | Ht 67.0 in | Wt 199.0 lb

## 2023-07-16 DIAGNOSIS — G3184 Mild cognitive impairment, so stated: Secondary | ICD-10-CM | POA: Diagnosis not present

## 2023-07-16 MED ORDER — MEMANTINE HCL 5 MG PO TABS: ORAL_TABLET | ORAL | 3 refills | Status: DC

## 2023-07-16 NOTE — Progress Notes (Signed)
Assessment/Plan:   Mild Cognitive Impairment   Rick Melendez is a very pleasant 87 y.o. RH malewith a history of hypertension, hyperlipidemia, DM2, osteoarthritis with multiple surgeries, mild anemia, hard of hearing, OSA not on CPAP after weight loss, CAD, bradycardia, hypoproteinemia seen today in follow up for memory loss. Patient is currently on memantine 5 mg twice daily, tolerating well. Memory is stable, with MMSE of 27/30. Patient is able to participate on his IADLs.   Continues to drive      Follow up in 6  months. Continue memantine 5 mg twice daily, tolerating well.  Resume CPAP for OSA Recommend good control of her cardiovascular risk factors Continue to control mood as per PCP    Subjective:    This patient is accompanied in the office by his wife who supplements the history.  Previous records as well as any outside records available were reviewed prior to todays visit. Patient was last seen on 01/05/2023, last MoCA on 10/02/2022 was 20/30.    Any changes in memory since last visit? "About the same".  He does not like to do any brain exercises.  He enjoys going to [employer]. repeats oneself?  Endorsed Disoriented when walking into a room?  Patient denies    Leaving objects in unusual places?  May misplace things but not in unusual places   Wandering behavior?  denies   Any personality changes since last visit?  denies   Any worsening depression?:  denies   Hallucinations or paranoia?  denies   Seizures? denies    Any sleep changes?  He naps frequently during the day, so he does not sleep well at night.  Denies vivid dreams, REM behavior or sleepwalking   Sleep apnea?   Does not  like to use CPAP  Any hygiene concerns? denies  Independent of bathing and dressing?  Endorsed  Does the patient needs help with medications?  Wife has always been in charge  Who is in charge of the finances?  Wife has always been in charge     Any changes in appetite?  denies    Patient  have trouble swallowing? denies   Does the patient cook? No Any headaches?   denies   Chronic back pain endorsed, has seen by orthopedics, trying different conservative therapies including steroid injections due to advanced multilevel DJD with changes in stenosis at multiple levels and severe spinal and severe  stenosis at the L4-L5, not interested in NS consultation. Ambulates with difficulty? denies   Recent falls or head injuries? denies     Unilateral weakness, numbness or tingling? denies   Any tremors?  denies   Any anosmia?  Patient denies   Any incontinence of urine?  Denies  Any bowel dysfunction?     denies      Patient lives with his wife Does the patient drive?  Yes, denies getting lost  Initial evaluation 10/02/2022  How long did patient have memory difficulties? "I didn't notice anything".  Wife "is not sure that he has significant memory changes, the doctor sent me here".  Disoriented when walking into a room?  Patient denies   Leaving objects in unusual places?  Patient denies   Patient lives wife Ambulates  with difficulty? Uses a walker and a cane for stability due to arthritis  Recent falls?  Fell at the end of July and uses a walker. ONnPT/OT weekly  Any head injuries?  Patient denies   History of seizures?   Patient denies  Wandering behavior?  Patient denies   Patient drives? No issues driving  Any mood changes ?  Denies  Any depression?:  Patient denies  "I like me too good!" Hallucinations?  Patient denies now, but when" I was on Ozempic he did, once off I was fine". Paranoia?  Patient denies   Patient reports that sleeps well without vivid dreams, REM behavior or sleepwalking    History of sleep apnea? Endorsed, but after 40 lb weight loss "I no longer need CPAP"" Any hygiene concerns?  Patient denies   Independent of bathing and dressing?  Endorsed  Does the patient needs help with medications? He is in charge  Who is in charge of the finances? Wife has  always been is in charge   Any changes in appetite?  Patient denies   Patient have trouble swallowing? Patient denies   Does the patient cook?  Patient denies   Any kitchen accidents such as leaving the stove on? Patient denies   Any headaches?  Patient denies   Double vision? Patient denies   Any focal numbness or tingling?  Patient denies   Chronic back pain: Endorsed, s/p falls and several surgeries shoulder, C spine Unilateral weakness?  Patient denies   Any tremors?  Patient denies   Any history of anosmia?  Patient denies   Any incontinence of urine?  Patient denies   Any bowel dysfunction?   Patient denies   History of heavy alcohol intake?  Patient denies   History of heavy tobacco use?  Patient denies   Family history of dementia?   Denies   Retired plumber   MRI brain report 09/22/22 There is mild diffuse cerebral atrophy. No hippocampal atrophy.     Labs 10/02/2022 TSH 1.93 PREVIOUS MEDICATIONS:   CURRENT MEDICATIONS:  Outpatient Encounter Medications as of 07/16/2023  Medication Sig   aspirin EC 81 MG tablet Take 81 mg by mouth daily.   clopidogrel (PLAVIX) 75 MG tablet Take 75 mg by mouth daily.   Cyanocobalamin 3000 MCG SUBL as directed Sublingual   ezetimibe (ZETIA) 10 MG tablet Take 10 mg by mouth daily.   FARXIGA 10 MG TABS tablet Take 10 mg by mouth daily.   furosemide (LASIX) 20 MG tablet Take 1 tablet by mouth daily.   gabapentin (NEURONTIN) 300 MG capsule Take 1 capsule by mouth at night for 7 nights and then 1 in the morning and one at night for 7 days and then 3 times a day.   glipiZIDE (GLUCOTROL) 5 MG tablet Take 5 mg by mouth daily.   KERENDIA 10 MG TABS Take 10 mg by mouth daily.   metoprolol tartrate (LOPRESSOR) 25 MG tablet Take 12.5 mg by mouth daily.    omeprazole (PRILOSEC) 20 MG capsule Take 1 capsule by mouth daily.   ONETOUCH VERIO test strip U FOR GLUCOSE TESTING TID   OVER THE COUNTER MEDICATION USES CPAP NIGHTLY   rosuvastatin (CRESTOR) 10  MG tablet Take 10 mg by mouth at bedtime.   traMADol (ULTRAM) 50 MG tablet Take 50-100 mg by mouth 3 (three) times daily.   [DISCONTINUED] lisinopril (ZESTRIL) 5 MG tablet Take 5 mg by mouth daily.   [DISCONTINUED] memantine (NAMENDA) 5 MG tablet Take 1 tablet (5 mg at night) for 2 weeks, then increase to 1 tablet (5 mg) twice a day   Coenzyme Q10 (COQ10) 200 MG CAPS Take 200 mg by mouth daily. (Patient not taking: Reported on 05/05/2023)   HYDROcodone-acetaminophen (NORCO/VICODIN) 5-325 MG per tablet  Take 1 tablet by mouth 3 (three) times daily. (Patient not taking: Reported on 05/05/2023)   memantine (NAMENDA) 5 MG tablet Take 1 tablet  twice a day   Omega-3 Fatty Acids (OMEGA-3 FISH OIL) 1200 MG CAPS Take 2,400 mg by mouth 2 (two) times daily. Take 4 capsules daily   [DISCONTINUED] memantine (NAMENDA) 5 MG tablet Take 1 tablet  twice a day   No facility-administered encounter medications on file as of 07/16/2023.       07/16/2023   11:00 AM  MMSE - Mini Mental State Exam  Orientation to time 4  Orientation to Place 5  Registration 3  Attention/ Calculation 5  Recall 2  Language- name 2 objects 2  Language- repeat 1  Language- follow 3 step command 3  Language- read & follow direction 1  Write a sentence 1  Copy design 0  Total score 27      10/02/2022   10:00 AM  Montreal Cognitive Assessment   Visuospatial/ Executive (0/5) 1  Naming (0/3) 3  Attention: Read list of digits (0/2) 2  Attention: Read list of letters (0/1) 1  Attention: Serial 7 subtraction starting at 100 (0/3) 3  Language: Repeat phrase (0/2) 1  Language : Fluency (0/1) 0  Abstraction (0/2) 0  Delayed Recall (0/5) 4  Orientation (0/6) 5  Total 20  Adjusted Score (based on education) 20    Objective:     PHYSICAL EXAMINATION:    VITALS:   Vitals:   07/16/23 1040  BP: 112/68  Pulse: 74  Resp: 18  SpO2: 98%  Weight: 199 lb (90.3 kg)  Height: 5\' 7"  (1.702 m)    GEN:  The patient appears stated  age and is in NAD. HEENT:  Normocephalic, atraumatic.   Neurological examination:  General: NAD, well-groomed, appears stated age. Orientation: The patient is alert. Oriented to person, place and date Cranial nerves: There is good facial symmetry.The speech is fluent and clear. No aphasia or dysarthria. Fund of knowledge is appropriate. Recent memory impaired, remote memory is normal.   Attention and concentration are  normal.  Able to name objects and repeat phrases.  Hearing is decreased to conversational tone.   Sensation: Sensation is intact to light touch throughout Motor: Strength is at least antigravity x4. DTR's 2/4 in UE/LE     Movement examination: Tone: There is normal tone in the UE/LE Abnormal movements:  no tremor.  No myoclonus.  No asterixis.   Coordination:  There is no decremation with RAM's. Normal finger to nose  Gait and Station: The patient has some difficulty arising out of a deep-seated chair without the use of the hands. The patient's stride length is good.  Gait is cautious and narrow.    Thank you for allowing [place] the opportunity to participate in the care of this nice patient. Please do not hesitate to contact us for any questions or concerns.   Total time spent on today's visit was 20 minutes dedicated to this patient today, preparing to see patient, examining the patient, ordering tests and/or medications and counseling the patient, documenting clinical information in the EHR or other health record, independently interpreting results and communicating results to the patient/family, discussing treatment and goals, answering patient's questions and coordinating care.  Cc:  Merri Brunette, MD  Marlowe Kays 07/16/2023 11:16 AM

## 2023-07-16 NOTE — Patient Instructions (Addendum)
It was a pleasure to see you today at our office.   Recommendations:  Continue  Memantine  5 mg  twice a day   Follow up in 6 months  Use the hearing aids  Use the CPAP!!!  Whom to call:  Memory  decline, memory medications: Call our office [phone]   For psychiatric meds, mood meds: Please have your primary care physician manage these medications.    For assessment of decision of mental capacity and competency:  Call Dr. Erick Blinks, geriatric psychiatrist at [phone]       If you have any severe symptoms of a stroke, or other severe issues such as confusion,severe chills or fever, etc call 911 or go to the ER as you may need to be evaluated further      RECOMMENDATIONS FOR ALL PATIENTS WITH MEMORY PROBLEMS: 1. Continue to exercise (Recommend 30 minutes of walking everyday, or 3 hours every week) 2. Increase social interactions - continue going to [place] and enjoy social gatherings with friends and family 3. Eat healthy, avoid fried foods and eat more fruits and vegetables 4. Maintain adequate blood pressure, blood sugar, and blood cholesterol level. Reducing the risk of stroke and cardiovascular disease also helps promoting better memory. 5. Avoid stressful situations. Live a simple life and avoid aggravations. Organize your time and prepare for the next day in anticipation. 6. Sleep well, avoid any interruptions of sleep and avoid any distractions in the bedroom that may interfere with adequate sleep quality 7. Avoid sugar, avoid sweets as there is a strong link between excessive sugar intake, diabetes, and cognitive impairment We discussed the Mediterranean diet, which has been shown to help patients reduce the risk of progressive memory disorders and reduces cardiovascular risk. This includes eating fish, eat fruits and green leafy vegetables, nuts like almonds and hazelnuts, walnuts, and also use olive oil. Avoid fast foods and fried foods as much as possible.  Avoid sweets and sugar as sugar use has been linked to worsening of memory function.  There is always a concern of gradual progression of memory problems. If this is the case, then we may need to adjust level of care according to patient needs. Support, both to the patient and caregiver, should then be put into place.     FALL PRECAUTIONS: Be cautious when walking. Scan the area for obstacles that may increase the risk of trips and falls. When getting up in the mornings, sit up at the edge of the bed for a few minutes before getting out of bed. Consider elevating the bed at the head end to avoid drop of blood pressure when getting up. Walk always in a well-lit room (use night lights in the walls). Avoid area rugs or power cords from appliances in the middle of the walkways. Use a walker or a cane if necessary and consider physical therapy for balance exercise. Get your eyesight checked regularly.  FINANCIAL OVERSIGHT: Supervision, especially oversight when making financial decisions or transactions is also recommended.  HOME SAFETY: Consider the safety of the kitchen when operating appliances like stoves, microwave oven, and blender. Consider having supervision and share cooking responsibilities until no longer able to participate in those. Accidents with firearms and other hazards in the house should be identified and addressed as well.   ABILITY TO BE LEFT ALONE: If patient is unable to contact 911 operator, consider using LifeLine, or when the need is there, arrange for someone to stay with patients. Smoking is a Air cabin crew hazard,  consider supervision or cessation. Risk of wandering should be assessed by caregiver and if detected at any point, supervision and safe proof recommendations should be instituted.  MEDICATION SUPERVISION: Inability to self-administer medication needs to be constantly addressed. Implement a mechanism to ensure safe administration of the medications.   DRIVING: Regarding  driving, in patients with progressive memory problems, driving will be impaired. We advise to have someone else do the driving if trouble finding directions or if minor accidents are reported. Independent driving assessment is available to determine safety of driving.   If you are interested in the driving assessment, you can contact the following:  [employer] in [place] [phone]  [facility] [phone]  [facility] [phone] or [phone]    Mediterranean Diet A Mediterranean diet refers to food and lifestyle choices that are based on the traditions of countries located on the [employer]. This way of eating has been shown to help prevent certain conditions and improve outcomes for people who have chronic diseases, like kidney disease and heart disease. What are tips for following this plan? Lifestyle  Cook and eat meals together with your family, when possible. Drink enough fluid to keep your urine clear or pale yellow. Be physically active every day. This includes: Aerobic exercise like running or swimming. Leisure activities like gardening, walking, or housework. Get 7-8 hours of sleep each night. If recommended by your health care provider, drink red wine in moderation. This means 1 glass a day for nonpregnant women and 2 glasses a day for men. A glass of wine equals 5 oz (150 mL). Reading food labels  Check the serving size of packaged foods. For foods such as rice and pasta, the serving size refers to the amount of cooked product, not dry. Check the total fat in packaged foods. Avoid foods that have saturated fat or trans fats. Check the ingredients list for added sugars, such as corn syrup. Shopping  At the grocery store, buy most of your food from the areas near the walls of the store. This includes: Fresh fruits and vegetables (produce). Grains, beans, nuts, and seeds. Some of  these may be available in unpackaged forms or large amounts (in bulk). Fresh seafood. Poultry and eggs. Low-fat dairy products. Buy whole ingredients instead of prepackaged foods. Buy fresh fruits and vegetables in-season from local farmers markets. Buy frozen fruits and vegetables in resealable bags. If you do not have access to quality fresh seafood, buy precooked frozen shrimp or canned fish, such as tuna, salmon, or sardines. Buy small amounts of raw or cooked vegetables, salads, or olives from the deli or salad bar at your store. Stock your pantry so you always have certain foods on hand, such as olive oil, canned tuna, canned tomatoes, rice, pasta, and beans. Cooking  Cook foods with extra-virgin olive oil instead of using butter or other vegetable oils. Have meat as a side dish, and have vegetables or grains as your main dish. This means having meat in small portions or adding small amounts of meat to foods like pasta or stew. Use beans or vegetables instead of meat in common dishes like chili or lasagna. Experiment with different cooking methods. Try roasting or broiling vegetables instead of steaming or sauteing them. Add frozen vegetables to soups, stews, pasta, or rice. Add nuts or seeds for added healthy fat at each meal. You can add these to yogurt, salads, or vegetable dishes. Marinate fish or vegetables using olive oil,  lemon juice, garlic, and fresh herbs. Meal planning  Plan to eat 1 vegetarian meal one day each week. Try to work up to 2 vegetarian meals, if possible. Eat seafood 2 or more times a week. Have healthy snacks readily available, such as: Vegetable sticks with hummus. Greek yogurt. Fruit and nut trail mix. Eat balanced meals throughout the week. This includes: Fruit: 2-3 servings a day Vegetables: 4-5 servings a day Low-fat dairy: 2 servings a day Fish, poultry, or lean meat: 1 serving a day Beans and legumes: 2 or more servings a week Nuts and seeds: 1-2  servings a day Whole grains: 6-8 servings a day Extra-virgin olive oil: 3-4 servings a day Limit red meat and sweets to only a few servings a month What are my food choices? Mediterranean diet Recommended Grains: Whole-grain pasta. Brown rice. Bulgar wheat. Polenta. Couscous. Whole-wheat bread. Orpah Cobb. Vegetables: Artichokes. Beets. Broccoli. Cabbage. Carrots. Eggplant. Green beans. Chard. Kale. Spinach. Onions. Leeks. Peas. Squash. Tomatoes. Peppers. Radishes. Fruits: Apples. Apricots. Avocado. Berries. Bananas. Cherries. Dates. Figs. Grapes. Lemons. Melon. Oranges. Peaches. Plums. Pomegranate. Meats and other protein foods: Beans. Almonds. Sunflower seeds. Pine nuts. Peanuts. Cod. Salmon. Scallops. Shrimp. Tuna. Tilapia. Clams. Oysters. Eggs. Dairy: Low-fat milk. Cheese. Greek yogurt. Beverages: Water. Red wine. Herbal tea. Fats and oils: Extra virgin olive oil. Avocado oil. Grape seed oil. Sweets and desserts: [place] yogurt with honey. Baked apples. Poached pears. Trail mix. Seasoning and other foods: Basil. Cilantro. Coriander. Cumin. Mint. Parsley. Sage. Rosemary. Tarragon. Garlic. Oregano. Thyme. Pepper. Balsalmic vinegar. Tahini. Hummus. Tomato sauce. Olives. Mushrooms. Limit these Grains: Prepackaged pasta or rice dishes. Prepackaged cereal with added sugar. Vegetables: Deep fried potatoes (french fries). Fruits: Fruit canned in syrup. Meats and other protein foods: Beef. Pork. Lamb. Poultry with skin. Hot dogs. Tomasa Blase. Dairy: Ice cream. Sour cream. Whole milk. Beverages: Juice. Sugar-sweetened soft drinks. Beer. Liquor and spirits. Fats and oils: Butter. Canola oil. Vegetable oil. Beef fat (tallow). Lard. Sweets and desserts: Cookies. Cakes. Pies. Candy. Seasoning and other foods: Mayonnaise. Premade sauces and marinades. The items listed may not be a complete list. Talk with your dietitian about what dietary choices are right for you. Summary The Mediterranean diet  includes both food and lifestyle choices. Eat a variety of fresh fruits and vegetables, beans, nuts, seeds, and whole grains. Limit the amount of red meat and sweets that you eat. Talk with your health care provider about whether it is safe for you to drink red wine in moderation. This means 1 glass a day for nonpregnant women and 2 glasses a day for men. A glass of wine equals 5 oz (150 mL). This information is not intended to replace advice given to you by your health care provider. Make sure you discuss any questions you have with your health care provider. Document Released: 07/30/2016 Document Revised: 09/01/2016 Document Reviewed: 07/30/2016 Elsevier Interactive Patient Education  2017 [employer].   Your provider has requested that you have labwork completed today. Please go to [facility] ([address]) on the second floor of this building before leaving the office today. You do not need to check in. If you are not called within 15 minutes please check with the front desk.

## 2023-08-10 NOTE — Progress Notes (Unsigned)
Rick Melendez, male    DOB: 11-Sep-1936    MRN: [id number]   Brief patient profile:  87  yowm  quit smoking 1982 at wt around 200 with no residual resp cc   referred to pulmonary clinic in [place]  08/11/2023 by Dr Renne Crigler for cough and doe summer of 2024  better off ACEi x 07/07/23       History of Present Illness  08/11/2023  Pulmonary/ 1st office eval/ Cassie Shedlock / [place] [facility] no maint rx  Chief Complaint  Patient presents with   Establish Care   Cough  Dyspnea:  limited by knees and back / not by breathing  Cough: none at time of initial eval  Sleep: cpap / level bed/ one pillow  SABA use: has it not need 02: none   No obvious day to day or daytime pattern/variability or assoc excess/ purulent sputum or mucus plugs or hemoptysis or cp or chest tightness, subjective wheeze or overt sinus or hb symptoms.    Also denies any obvious fluctuation of symptoms with weather or environmental changes or other aggravating or alleviating factors except as outlined above   No unusual exposure hx or h/o childhood pna/ asthma or knowledge of premature birth.  Current Allergies, Complete Past Medical History, Past Surgical History, Family History, and Social History were reviewed in [employer] record.  ROS  The following are not active complaints unless bolded Hoarseness, sore throat, dysphagia, dental problems, itching, sneezing,  nasal congestion or discharge of excess mucus or purulent secretions, ear ache,   fever, chills, sweats, unintended wt loss or wt gain, classically pleuritic or exertional cp,  orthopnea pnd or arm/hand swelling  or leg swelling, presyncope, palpitations, abdominal pain, anorexia, nausea, vomiting, diarrhea  or change in bowel habits or change in bladder habits, change in stools or change in urine, dysuria, hematuria,  rash, arthralgias, visual complaints, headache, numbness, weakness or ataxia or problems with walking or coordination,  change in  mood or  memory.              Outpatient Medications Prior to Visit  Medication Sig Dispense Refill   aspirin EC 81 MG tablet Take 81 mg by mouth daily.     clopidogrel (PLAVIX) 75 MG tablet Take 75 mg by mouth daily.     Cyanocobalamin 3000 MCG SUBL as directed Sublingual     ezetimibe (ZETIA) 10 MG tablet Take 10 mg by mouth daily.     famotidine (PEPCID) 20 MG tablet Take 20 mg by mouth 2 (two) times daily.     FARXIGA 10 MG TABS tablet Take 10 mg by mouth daily.     furosemide (LASIX) 20 MG tablet Take 1 tablet by mouth daily.  5   gabapentin (NEURONTIN) 300 MG capsule Take 1 capsule by mouth at night for 7 nights and then 1 in the morning and one at night for 7 days and then 3 times a day. 90 capsule 1   glipiZIDE (GLUCOTROL) 5 MG tablet Take 5 mg by mouth daily.     HYDROcodone-acetaminophen (NORCO/VICODIN) 5-325 MG per tablet Take 1 tablet by mouth 3 (three) times daily.  0   ipratropium (ATROVENT) 0.06 % nasal spray Place 1 spray into the nose 3 (three) times daily.     KERENDIA 10 MG TABS Take 10 mg by mouth daily.     memantine (NAMENDA) 5 MG tablet Take 1 tablet  twice a day 180 tablet 3   metoprolol tartrate (  LOPRESSOR) 25 MG tablet Take 12.5 mg by mouth daily.      Omega-3 Fatty Acids (OMEGA-3 FISH OIL) 1200 MG CAPS Take 2,400 mg by mouth 2 (two) times daily. Take 4 capsules daily     omeprazole (PRILOSEC) 20 MG capsule Take 1 capsule by mouth daily.  0   ONETOUCH VERIO test strip U FOR GLUCOSE TESTING TID  1   OVER THE COUNTER MEDICATION USES CPAP NIGHTLY     rosuvastatin (CRESTOR) 40 MG tablet Take 40 mg by mouth daily.     traMADol (ULTRAM) 50 MG tablet Take 50-100 mg by mouth 3 (three) times daily.     Coenzyme Q10 (COQ10) 200 MG CAPS Take 200 mg by mouth daily. (Patient not taking: Reported on 05/05/2023) 90 capsule 3   rosuvastatin (CRESTOR) 10 MG tablet Take 10 mg by mouth at bedtime.  0   No facility-administered medications prior to visit.    Past Medical  History:  Diagnosis Date   Arthritis    Coronary artery disease 2007   cabg x3   Diabetes mellitus without complication (HCC)    Hyperlipidemia    Hypertension    Influenza B 03/19/2017   OSA on CPAP    Dr Tresa Endo - follows and treatmentof sleep apnea   PVD (peripheral vascular disease) (HCC) 2003   DR BERRY-  -LEFT LEG STENTING      Objective:     BP 110/60   Pulse 75   Ht 5\' 7"  (1.702 m)   Wt 202 lb (91.6 kg)   SpO2 95%   BMI 31.64 kg/m   SpO2: 95 % RA   Very pleasant mod obese wm nad    HEENT : Oropharynx  clear      Nasal turbinates nl    NECK :  without  apparent JVD/ palpable Nodes/TM    LUNGS: no acc muscle use,  Nl contour chest which is clear to A and P bilaterally without cough on insp or exp maneuvers   CV:  RRR  no s3 or murmur or increase in P2, and no edema   ABD:  quite obese soft and nontender with limited inspiratory excursion in the supine position. No bruits or organomegaly appreciated   MS:  Nl gait/ ext warm without deformities Or obvious joint restrictions  calf tenderness, cyanosis or clubbing    SKIN: warm and dry without lesions    NEURO:  alert, approp, nl sensorium with  no motor or cerebellar deficits apparent.   Cxr ok per pt per Dr Renne Crigler in July 2024 when stopped ACEi     Assessment   DOE (dyspnea on exertion) Stopped smoking 1982 with cough and mild doe > resolved  - Spirometry  07/22/15   FEV1 1.20 (48%%)  Ratio 0.69   -  refractory cough on ACEi summer 2024 > resolved off ACEi  -  08/11/2023   Walked on RA  x  3  lap(s) =  approx 450  ft  @ brisk pace, stopped due to end of study  with lowest 02 sats 96% s sob / cp or cough     When respiratory symptoms begin or become refractory well after a patient reports complete smoking cessation,  Especially when this wasn't the case while they were smoking, a red flag is raised based on the work of Dr Primitivo Gauze which states:  if you quit smoking when your best day FEV1 is still well  preserved it is highly unlikely you will progress  to severe disease.  That is to say, once the smoking stops,  the symptoms should not suddenly erupt or markedly worsen.  If so, the differential diagnosis should include  obesity/deconditioning,  LPR/Reflux/Aspiration syndromes,  occult CHF, or  especially side effect of medications commonly used in this population, ACEi in this case (see hbp)   He may have very mild copd and be prone to AB esp in springtime so reviewed approp use/ guidelines for using it advised f/u here if needing saba more than 2x weekly - otherwise no f/u needed   Upper airway cough syndrome Onset summer of  2024  - d/c acei 07/07/23 > resolved to his satisfaction   Upper airway cough syndrome (previously labeled PNDS),  is so named because it's frequently impossible to sort out how much is  CR/sinusitis with freq throat clearing (which can be related to primary GERD)   vs  causing  secondary (" extra esophageal")  GERD from wide swings in gastric pressure that occur with throat clearing, often  promoting self use of mint and menthol lozenges that reduce the lower esophageal sphincter tone and exacerbate the problem further in a cyclical fashion.   These are the same pts (now being labeled as having "irritable larynx syndrome" by some cough centers) who not infrequently have a history of having failed to tolerate ace inhibitors,  dry powder inhalers or biphosphonates or report having atypical/extraesophageal reflux symptoms that don't respond to standard doses of PPI  and are easily confused as having aecopd or asthma flares by even experienced allergists/ pulmonologists (myself included).   May still be prone to cough in pollen season but would start with otcs like zyrtec and follow up here or with allergy prn   Essential hypertension D/c acei 07/07/23 due to cough  > resolved 08/11/2023 @ 1st pulm f/u   Although even in retrospect it may not be clear the ACEi contributed to the  pt's symptoms,  Pt improved off them and adding them back at this point or in the future would risk confusion in interpretation of non-specific respiratory symptoms to which this patient is prone  ie  Better not to muddy the waters here.   F/u prn   Each maintenance medication was reviewed in detail including emphasizing most importantly the difference between maintenance and prns and under what circumstances the prns are to be triggered using an action plan format where appropriate.  Total time for H and P, chart review, counseling, reviewing saba device(s) , directly observing portions of ambulatory 02 saturation study/ and generating customized AVS unique to this office visit / same day charting = 46 min new pt eval                   Sandrea Hughs, MD 08/11/2023

## 2023-08-11 ENCOUNTER — Ambulatory Visit: Payer: Medicare Other | Admitting: Internal Medicine

## 2023-08-11 ENCOUNTER — Encounter: Payer: Self-pay | Admitting: Internal Medicine

## 2023-08-11 VITALS — BP 110/60 | HR 75 | Ht 67.0 in | Wt 202.0 lb

## 2023-08-11 DIAGNOSIS — R058 Other specified cough: Secondary | ICD-10-CM | POA: Diagnosis not present

## 2023-08-11 DIAGNOSIS — R0609 Other forms of dyspnea: Secondary | ICD-10-CM | POA: Insufficient documentation

## 2023-08-11 DIAGNOSIS — I10 Essential (primary) hypertension: Secondary | ICD-10-CM

## 2023-08-11 NOTE — Patient Instructions (Signed)
Only use your albuterol as a rescue medication to be used if you can't catch your breath by resting or doing a relaxed purse lip breathing pattern.  - The less you use it, the better it will work when you need it. - Ok to use up to 2 puffs  every 4 hours if you must but call for immediate appointment if use goes up over your usual need - Don't leave home without it !!  (think of it like the starter fluidfor your car)    Also  Ok to try albuterol 15 min before an activity (on alternating days)  that you know would usually make you short of breath and see if it makes any difference and if makes none then don't take albuterol after activity unless you can't catch your breath as this means it's the resting that helps, not the albuterol.      Pulmonary follow up is as needed but bring your inhaler

## 2023-08-11 NOTE — Assessment & Plan Note (Signed)
Onset summer of  2024  - d/c acei 07/07/23 > resolved to his satisfaction   Upper airway cough syndrome (previously labeled PNDS),  is so named because it's frequently impossible to sort out how much is  CR/sinusitis with freq throat clearing (which can be related to primary GERD)   vs  causing  secondary (" extra esophageal")  GERD from wide swings in gastric pressure that occur with throat clearing, often  promoting self use of mint and menthol lozenges that reduce the lower esophageal sphincter tone and exacerbate the problem further in a cyclical fashion.   These are the same pts (now being labeled as having "irritable larynx syndrome" by some cough centers) who not infrequently have a history of having failed to tolerate ace inhibitors,  dry powder inhalers or biphosphonates or report having atypical/extraesophageal reflux symptoms that don't respond to standard doses of PPI  and are easily confused as having aecopd or asthma flares by even experienced allergists/ pulmonologists (myself included).   May still be prone to cough in pollen season but would start with otcs like zyrtec and follow up here or with allergy prn

## 2023-08-11 NOTE — Assessment & Plan Note (Signed)
D/c acei 07/07/23 due to cough  > resolved 08/11/2023 @ 1st pulm f/u   Although even in retrospect it may not be clear the ACEi contributed to the pt's symptoms,  Pt improved off them and adding them back at this point or in the future would risk confusion in interpretation of non-specific respiratory symptoms to which this patient is prone  ie  Better not to muddy the waters here.   F/u prn   Each maintenance medication was reviewed in detail including emphasizing most importantly the difference between maintenance and prns and under what circumstances the prns are to be triggered using an action plan format where appropriate.  Total time for H and P, chart review, counseling, reviewing saba device(s) , directly observing portions of ambulatory 02 saturation study/ and generating customized AVS unique to this office visit / same day charting = 46 min new pt eval

## 2023-08-11 NOTE — Assessment & Plan Note (Signed)
Stopped smoking 1982 with cough and mild doe > resolved  - Spirometry  07/22/15   FEV1 1.20 (48%%)  Ratio 0.69   -  refractory cough on ACEi summer 2024 > resolved off ACEi  -  08/11/2023   Walked on RA  x  3  lap(s) =  approx 450  ft  @ brisk pace, stopped due to end of study  with lowest 02 sats 96% s sob / cp or cough     When respiratory symptoms begin or become refractory well after a patient reports complete smoking cessation,  Especially when this wasn't the case while they were smoking, a red flag is raised based on the work of Dr Primitivo Gauze which states:  if you quit smoking when your best day FEV1 is still well preserved it is highly unlikely you will progress to severe disease.  That is to say, once the smoking stops,  the symptoms should not suddenly erupt or markedly worsen.  If so, the differential diagnosis should include  obesity/deconditioning,  LPR/Reflux/Aspiration syndromes,  occult CHF, or  especially side effect of medications commonly used in this population, ACEi in this case (see hbp)   He may have very mild copd and be prone to AB esp in springtime so reviewed approp use/ guidelines for using it advised f/u here if needing saba more than 2x weekly - otherwise no f/u needed

## 2023-09-01 DIAGNOSIS — D692 Other nonthrombocytopenic purpura: Secondary | ICD-10-CM | POA: Diagnosis not present

## 2023-09-01 DIAGNOSIS — L821 Other seborrheic keratosis: Secondary | ICD-10-CM | POA: Diagnosis not present

## 2023-09-01 DIAGNOSIS — C44212 Basal cell carcinoma of skin of right ear and external auricular canal: Secondary | ICD-10-CM | POA: Diagnosis not present

## 2023-09-01 DIAGNOSIS — Z85828 Personal history of other malignant neoplasm of skin: Secondary | ICD-10-CM | POA: Diagnosis not present

## 2023-09-06 DIAGNOSIS — E118 Type 2 diabetes mellitus with unspecified complications: Secondary | ICD-10-CM | POA: Diagnosis not present

## 2023-09-06 DIAGNOSIS — I10 Essential (primary) hypertension: Secondary | ICD-10-CM | POA: Diagnosis not present

## 2023-09-07 ENCOUNTER — Ambulatory Visit: Payer: Medicare Other | Admitting: Podiatry

## 2023-09-07 DIAGNOSIS — Z7901 Long term (current) use of anticoagulants: Secondary | ICD-10-CM

## 2023-09-07 DIAGNOSIS — B351 Tinea unguium: Secondary | ICD-10-CM | POA: Diagnosis not present

## 2023-09-07 DIAGNOSIS — M79675 Pain in left toe(s): Secondary | ICD-10-CM | POA: Diagnosis not present

## 2023-09-07 DIAGNOSIS — M79674 Pain in right toe(s): Secondary | ICD-10-CM

## 2023-09-07 NOTE — Progress Notes (Unsigned)
Subjective: No chief complaint on file.   87 year old male presents the office today for concerns of thick, elongated nails not able to trim himself.  No open lesions.  No injuries.  He is on Plavix.   Objective: AAO x3, NAD DP/PT pulses palpable bilaterally, CRT less than 3 seconds Nails are hypertrophic, dystrophic, brittle, discolored, elongated 10. No surrounding redness or drainage. Tenderness nails 1-5 bilaterally. No open lesions or pre-ulcerative lesions are identified today. No pain with calf compression, swelling, warmth, erythema  Assessment: Symptomatic onychomycosis  Plan: -All treatment options discussed with the patient including all alternatives, risks, complications.  -Toenails sharply debrided x 10 without any complications or bleeding. -Patient encouraged to call the office with any questions, concerns, change in symptoms.   Return in about 3 months (around 12/07/2023) for diabetic foot care.  Vivi Barrack DPM

## 2023-09-07 NOTE — Patient Instructions (Signed)

## 2023-09-09 DIAGNOSIS — Z0000 Encounter for general adult medical examination without abnormal findings: Secondary | ICD-10-CM | POA: Diagnosis not present

## 2023-09-09 DIAGNOSIS — G3184 Mild cognitive impairment, so stated: Secondary | ICD-10-CM | POA: Diagnosis not present

## 2023-09-09 DIAGNOSIS — E1169 Type 2 diabetes mellitus with other specified complication: Secondary | ICD-10-CM | POA: Diagnosis not present

## 2023-09-09 DIAGNOSIS — I2510 Atherosclerotic heart disease of native coronary artery without angina pectoris: Secondary | ICD-10-CM | POA: Diagnosis not present

## 2023-09-09 DIAGNOSIS — Z23 Encounter for immunization: Secondary | ICD-10-CM | POA: Diagnosis not present

## 2023-09-09 DIAGNOSIS — G8929 Other chronic pain: Secondary | ICD-10-CM | POA: Diagnosis not present

## 2023-09-09 DIAGNOSIS — E039 Hypothyroidism, unspecified: Secondary | ICD-10-CM | POA: Diagnosis not present

## 2023-09-09 DIAGNOSIS — K219 Gastro-esophageal reflux disease without esophagitis: Secondary | ICD-10-CM | POA: Diagnosis not present

## 2023-09-09 DIAGNOSIS — I10 Essential (primary) hypertension: Secondary | ICD-10-CM | POA: Diagnosis not present

## 2023-09-09 DIAGNOSIS — N1831 Chronic kidney disease, stage 3a: Secondary | ICD-10-CM | POA: Diagnosis not present

## 2023-09-09 DIAGNOSIS — M545 Low back pain, unspecified: Secondary | ICD-10-CM | POA: Diagnosis not present

## 2023-09-09 DIAGNOSIS — I739 Peripheral vascular disease, unspecified: Secondary | ICD-10-CM | POA: Diagnosis not present

## 2023-09-15 ENCOUNTER — Telehealth: Payer: Self-pay

## 2023-09-15 NOTE — Telephone Encounter (Signed)
-----   Message from Naaman Plummer sent at 09/13/2023  8:42 AM EDT ----- Waldron Session please ----- Message ----- From: Sharlet Salina, CMA Sent: 09/13/2023   8:38 AM EDT To: Tyrell Antonio, MD; Juanda Chance, NP   ----- Message ----- From: Dorcas Mcmurray Sent: 09/09/2023   2:24 PM EDT To: Sharlet Salina, CMA  For Dr. Alvester Morin per Elonda Husky

## 2023-09-15 NOTE — Telephone Encounter (Signed)
Spoke with patient and scheduled OV for 09/16/23.

## 2023-09-16 ENCOUNTER — Encounter: Payer: Self-pay | Admitting: Physical Medicine and Rehabilitation

## 2023-09-16 ENCOUNTER — Ambulatory Visit: Payer: Medicare Other | Admitting: Physical Medicine and Rehabilitation

## 2023-09-16 DIAGNOSIS — R269 Unspecified abnormalities of gait and mobility: Secondary | ICD-10-CM | POA: Diagnosis not present

## 2023-09-16 DIAGNOSIS — M5416 Radiculopathy, lumbar region: Secondary | ICD-10-CM

## 2023-09-16 DIAGNOSIS — M48062 Spinal stenosis, lumbar region with neurogenic claudication: Secondary | ICD-10-CM | POA: Diagnosis not present

## 2023-09-16 NOTE — Progress Notes (Signed)
Rick Melendez - 87 y.o. male MRN [id number]  Date of birth: May 12, 1936  Office Visit Note: Visit Date: 09/16/2023 PCP: Merri Brunette, MD Referred by: Merri Brunette, MD  Subjective: Chief Complaint  Patient presents with   Lower Back - Pain   HPI: Rick Melendez is a 87 y.o. male who comes in today for evaluation of chronic, worsening and severe bilateral lower back pain radiating to buttocks and hips, right greater than left. Patient is well known to [place], his wife is accompanying him during visit today. Pain ongoing for several years and is exacerbated by standing and walking. States his pain becomes severe when playing golf. States he is unable to stand or walk for long periods of time. He describes pain as sore, aching and sharp, currently rates as 8 out of 10. Some relief of pain with home exercise regimen, rest and medications. Patient reports some relief with OTC topical CBD cream. History of formal physical therapy with some relief of pain. Lumbar MRI imaging from 2021 exhibits advanced multi level degenerative changes. There is 6 mm anterolisthesis, severe spinal canal stenosis and severe bilateral lateral recess narrowing at the level of L4-L5. Patient underwent bilateral L3-L4 and L4-L5 radiofrequency ablation in our office in 2021, he reports minimal relief of pain with this procedure. He underwent bilateral L4 transforaminal epidural steroid injection in 2022, he reports significant relief of pain, greater than 50% for over a month. Patient using cane to assist with ambulation. Patient denies focal weakness, numbness and tingling. No recent trauma or falls.   Patients course is complicated by coronary artery disease,CABG x2, hypertension, peripheral vascular disease, diabetes mellitus and chronic kidney disease.      Review of Systems  Musculoskeletal:  Positive for back pain.  Neurological:  Negative for tingling, sensory change, focal weakness and weakness.  All other systems  reviewed and are negative.  Otherwise per HPI.  Assessment & Plan: Visit Diagnoses:    ICD-10-CM   1. Lumbar radiculopathy  M54.16 Ambulatory referral to Physical Medicine Rehab    2. Spinal stenosis of lumbar region with neurogenic claudication  M48.062 Ambulatory referral to Physical Medicine Rehab    3. Gait disturbance  R26.9 Ambulatory referral to Physical Medicine Rehab       Plan: Findings:  Chronic, worsening and severe bilateral lower back pain radiating to buttocks and hips, right greater than left. Patient continues to have severe pain despite good conservative therapies such as formal physical therapy, home exercise regimen, rest and use of medications. Patients clinical presentation and exam are consistent with neurogenic claudication as a result of spinal canal stenosis. There is severe multi factorial spinal canal stenosis at L4-L5. Next step is to perform diagnostic and hopefully therapeutic bilateral L4 transforaminal epidural steroid injection. If good relief of pain with injection we can repeat this procedure infrequently as needed. Patient has no questions regarding injection procedure. We briefly discussed surgical intervention, I am not sure he would be ideal surgical candidate due to advanced age and extensive cardiac history. No red flag symptoms noted upon exam today.     Meds & Orders: No orders of the defined types were placed in this encounter.   Orders Placed This Encounter  Procedures   Ambulatory referral to Physical Medicine Rehab    Follow-up: Return for Bilateral L4 transforaminal epidural steroid injection.   Procedures: No procedures performed      Clinical History: EXAM: MRI LUMBAR SPINE WITHOUT CONTRAST   TECHNIQUE: Multiplanar, multisequence MR  imaging of the lumbar spine was performed. No intravenous contrast was administered.   COMPARISON:  Lumbar MRI 05/27/2014   FINDINGS: Segmentation:  Normal.  Lowest disc space L5-S1   Alignment:  Mild anterolisthesis T12-L1. 4 mm retrolisthesis L1-2. Slight retrolisthesis L3-4. 6 mm anterolisthesis L4-5. Mild retrolisthesis L5-S1.   Vertebrae:  Normal bone marrow.  Negative for fracture or mass.   Conus medullaris and cauda equina: Conus extends to the L1-2 level. Conus and cauda equina appear normal.   Paraspinal and other soft tissues: Negative for mass or adenopathy. No soft tissue edema or fluid collection.   Disc levels:   T12-L1: Advanced disc degeneration left greater than right. Prominent left-sided spurring. Bilateral facet degeneration. Moderate spinal stenosis. Severe subarticular and foraminal stenosis on the left. Mild subarticular stenosis on the right. Mild progression of stenosis on the left since the prior study   L1-2: Advanced disc degeneration with diffuse endplate spurring and marked disc space narrowing. Mild facet degeneration. Mild to moderate spinal stenosis. Moderate subarticular stenosis bilaterally. No interval change   L2-3: Disc degeneration with diffuse disc bulging and endplate spurring. Mild facet degeneration. Mild subarticular stenosis bilaterally with mild progression   L3-4: Disc degeneration with diffuse disc bulging and spurring, right greater than left. Mild facet degeneration. Mild spinal stenosis. Moderate to severe right subarticular stenosis. Moderate left subarticular stenosis. No significant change   L4-5: 6 mm anterolisthesis with severe facet degeneration and diffuse bulging of the disc. Severe spinal stenosis and severe subarticular stenosis bilaterally have progressed since the prior study. In addition, there is severe right foraminal encroachment with right L4 nerve root impingement and moderate left foraminal encroachment.   L5-S1: Left paracentral disc protrusion with compression of the left S1 nerve root similar to the prior study. Mild facet degeneration bilaterally.   IMPRESSION: Advanced multilevel  degenerative change throughout the lumbar spine. There has been progression of degenerative changes stenosis at multiple levels as described above   6 mm anterolisthesis L4-5. Severe spinal and severe subarticular stenosis bilaterally have progressed in the interval. Severe right foraminal encroachment.     Electronically Signed   By: Marlan Palau M.D.   On: 08/22/2020 21:23   He reports that he quit smoking about 42 years ago. His smoking use included cigarettes. His smokeless tobacco use includes chew. No results for input(s): "HGBA1C", "LABURIC" in the last 8760 hours.  Objective:  VS:  HT:    WT:   BMI:     BP:   HR: bpm  TEMP: ( )  RESP:  Physical Exam Vitals and nursing note reviewed.  HENT:     Head: Normocephalic and atraumatic.     Right Ear: External ear normal.     Left Ear: External ear normal.     Nose: Nose normal.     Mouth/Throat:     Mouth: Mucous membranes are moist.  Eyes:     Extraocular Movements: Extraocular movements intact.  Cardiovascular:     Rate and Rhythm: Normal rate.     Pulses: Normal pulses.  Pulmonary:     Effort: Pulmonary effort is normal.  Abdominal:     General: Abdomen is flat. There is no distension.  Musculoskeletal:        General: Tenderness present.     Cervical back: Normal range of motion.     Comments: Patient is slow to rise from seated position to standing. Good lumbar range of motion. No pain noted with facet loading. 5/5 strength noted with  bilateral hip flexion, knee flexion/extension, ankle dorsiflexion/plantarflexion and EHL. No clonus noted bilaterally. No pain upon palpation of greater trochanters. No pain with internal/external rotation of bilateral hips. Sensation intact bilaterally. Negative slump test bilaterally. Ambulates with cane, gait slow and unsteady.   Skin:    General: Skin is warm and dry.     Capillary Refill: Capillary refill takes less than 2 seconds.  Neurological:     General: No focal deficit  present.     Mental Status: He is alert and oriented to person, place, and time.  Psychiatric:        Mood and Affect: Mood normal.        Behavior: Behavior normal.     Ortho Exam  Imaging: No results found.  Past Medical/Family/Surgical/Social History: Medications & Allergies reviewed per EMR, new medications updated. Patient Active Problem List   Diagnosis Date Noted   DOE (dyspnea on exertion) 08/11/2023   Upper airway cough syndrome 08/11/2023   Slow heart rate 04/14/2022   Influenza B    AKI (acute kidney injury) (HCC)    CAP (community acquired pneumonia) 03/19/2017   Bilateral primary osteoarthritis of knee 01/28/2017   Impingement syndrome of right shoulder 01/28/2017   CAD - CABG X 2 9/07 LIMA-LAD, SVG-OM. Low risk Myoview 7/12 09/07/2013   Essential hypertension 09/07/2013   Dyslipidemia 09/07/2013   Diabetes mellitus (HCC) 09/07/2013   Chronic renal insufficiency, stage III (moderate)- SCr 1.6 (Aug 2013) 09/07/2013   Sleep apnea- compliant with C-pap 09/07/2013   PVD (peripheral vascular disease)- Rt iliac disease by doppler 7/13 09/07/2013   Past Medical History:  Diagnosis Date   Arthritis    Coronary artery disease 2007   cabg x3   Diabetes mellitus without complication (HCC)    Hyperlipidemia    Hypertension    Influenza B 03/19/2017   OSA on CPAP    Dr Tresa Endo - follows and treatmentof sleep apnea   PVD (peripheral vascular disease) (HCC) 2003   DR BERRY-  -LEFT LEG STENTING   Family History  Problem Relation Age of Onset   Cancer Father    Cancer Brother    Past Surgical History:  Procedure Laterality Date   CARDIAC CATHETERIZATION  0911/2007   3 vessel CAD WITH LEFT MAIN CAD SEVERE ,norm lLIMA,LV nom. ,evidence for very mild aortic stenosis with gradient, mild stenosis distal abdnminal aotra and proximal left common iliac artery 30%   CORONARY ARTERY BYPASS GRAFT  08/31/2006   DR GERHARDT---LIMA to LAD, VEIN TO AN OBTUSE MARGINAL BRANCH AND   RIGHT CORONARY ARTERY   CPET  04/13/2012   normal   DOPPLER ECHOCARDIOGRAPHY  07/08/2011   EF =>55%   lower arterial doppler  07/01/2012   mildly abnormal lower extremity;ABIs >1 bilaterally with moderately high-grade right iliac stenosis that had not changed from proir study.   NM MYOCAR PERF WALL MOTION  7/182012   EF 59%, normal myocardial perfusio   Social History   Occupational History   Not on file  Tobacco Use   Smoking status: Former    Current packs/day: 0.00    Types: Cigarettes    Quit date: 09/03/1981    Years since quitting: 42.0   Smokeless tobacco: Current    Types: Chew  Substance and Sexual Activity   Alcohol use: Yes   Drug use: No   Sexual activity: Not on file

## 2023-09-16 NOTE — Progress Notes (Signed)
Functional Pain Scale - descriptive words and definitions  Moderate (4)   Constantly aware of pain, can complete ADLs with modification/sleep marginally affected at times/passive distraction is of no use, but active distraction gives some relief. Moderate range order  Average Pain 4  Lower back pain on both sides.Pain is the same as before

## 2023-10-04 ENCOUNTER — Ambulatory Visit: Payer: Medicare Other | Admitting: Physical Medicine and Rehabilitation

## 2023-10-04 ENCOUNTER — Other Ambulatory Visit: Payer: Self-pay

## 2023-10-04 VITALS — BP 96/63 | HR 106

## 2023-10-04 DIAGNOSIS — M5416 Radiculopathy, lumbar region: Secondary | ICD-10-CM

## 2023-10-04 MED ORDER — METHYLPREDNISOLONE ACETATE 40 MG/ML IJ SUSP
40.0000 mg | Freq: Once | INTRAMUSCULAR | Status: AC
Start: 2023-10-04 — End: 2023-10-04
  Administered 2023-10-04: 40 mg

## 2023-10-04 NOTE — Progress Notes (Signed)
Functional Pain Scale - descriptive words and definitions  Uncomfortable (3)  Pain is present but can complete all ADL's/sleep is slightly affected and passive distraction only gives marginal relief. Mild range order  Average Pain 3

## 2023-10-04 NOTE — Patient Instructions (Signed)

## 2023-10-05 DIAGNOSIS — C44212 Basal cell carcinoma of skin of right ear and external auricular canal: Secondary | ICD-10-CM | POA: Diagnosis not present

## 2023-10-07 ENCOUNTER — Ambulatory Visit: Payer: Medicare Other | Admitting: Orthopedic Surgery

## 2023-10-14 ENCOUNTER — Encounter: Payer: Self-pay | Admitting: Orthopedic Surgery

## 2023-10-14 ENCOUNTER — Ambulatory Visit: Payer: Medicare Other | Admitting: Orthopedic Surgery

## 2023-10-14 DIAGNOSIS — M170 Bilateral primary osteoarthritis of knee: Secondary | ICD-10-CM

## 2023-10-14 NOTE — Progress Notes (Signed)
Office Visit Note   Patient: Rick Melendez           Date of Birth: April 05, 1936           MRN: [id number] Visit Date: 10/14/2023              Requested by: Merri Brunette, MD [address] [place],  [place] [address] PCP: Merri Brunette, MD  Chief Complaint  Patient presents with   Left Knee - Follow-up   Right Knee - Follow-up      HPI: Patient is an 87 year old gentleman with osteoarthritis both knees.  Patient had good interval relief with a steroid injection 3 months ago.  Assessment & Plan: Visit Diagnoses:  1. Bilateral primary osteoarthritis of knee     Plan: Both knees were injected he tolerated this well.  Follow-Up Instructions: No follow-ups on file.   Ortho Exam  Patient is alert, oriented, no adenopathy, well-dressed, normal affect, normal respiratory effort. Examination of both knees patient does have varicose veins with venous insufficiency brawny edema but no open ulcers.  There is no effusion in either knee there is crepitation with range of motion and tenderness to palpation over the medial lateral joint line.  Imaging: No results found. No images are attached to the encounter.  Labs: Lab Results  Component Value Date   REPTSTATUS 03/24/2017 FINAL 03/19/2017   CULT NO GROWTH 5 DAYS 03/19/2017     No results found for: "ALBUMIN", "PREALBUMIN", "CBC"  Lab Results  Component Value Date   MG 1.5 (L) 03/19/2017   No results found for: "VD25OH"  No results found for: "PREALBUMIN"    Latest Ref Rng & Units 03/20/2017    4:43 AM 03/19/2017   12:47 PM  CBC EXTENDED  WBC 4.0 - 10.5 K/uL 8.4  10.9   RBC 4.22 - 5.81 MIL/uL 4.90  5.54   Hemoglobin 13.0 - 17.0 g/dL 84.1  32.4   HCT 40.1 - 52.0 % 40.3  45.6   Platelets 150 - 400 K/uL 146  177   NEUT# 1.7 - 7.7 K/uL  7.5   Lymph# 0.7 - 4.0 K/uL  1.7      There is no height or weight on file to calculate BMI.  Orders:  No orders of the defined types were placed in this  encounter.  No orders of the defined types were placed in this encounter.    Procedures: Large Joint Inj: bilateral knee on 10/14/2023 11:54 AM Indications: pain and diagnostic evaluation Details: 22 G 1.5 in needle, anteromedial approach  Arthrogram: No  Outcome: tolerated well, no immediate complications Procedure, treatment alternatives, risks and benefits explained, specific risks discussed. Consent was given by the patient. Immediately prior to procedure a time out was called to verify the correct patient, procedure, equipment, support staff and site/side marked as required. Patient was prepped and draped in the usual sterile fashion.      Clinical Data: No additional findings.  ROS:  All other systems negative, except as noted in the HPI. Review of Systems  Objective: Vital Signs: There were no vitals taken for this visit.  Specialty Comments:  EXAM: MRI LUMBAR SPINE WITHOUT CONTRAST   TECHNIQUE: Multiplanar, multisequence MR imaging of the lumbar spine was performed. No intravenous contrast was administered.   COMPARISON:  Lumbar MRI 05/27/2014   FINDINGS: Segmentation:  Normal.  Lowest disc space L5-S1   Alignment: Mild anterolisthesis T12-L1. 4 mm retrolisthesis L1-2. Slight retrolisthesis L3-4. 6 mm anterolisthesis L4-5. Mild  retrolisthesis L5-S1.   Vertebrae:  Normal bone marrow.  Negative for fracture or mass.   Conus medullaris and cauda equina: Conus extends to the L1-2 level. Conus and cauda equina appear normal.   Paraspinal and other soft tissues: Negative for mass or adenopathy. No soft tissue edema or fluid collection.   Disc levels:   T12-L1: Advanced disc degeneration left greater than right. Prominent left-sided spurring. Bilateral facet degeneration. Moderate spinal stenosis. Severe subarticular and foraminal stenosis on the left. Mild subarticular stenosis on the right. Mild progression of stenosis on the left since the prior study    L1-2: Advanced disc degeneration with diffuse endplate spurring and marked disc space narrowing. Mild facet degeneration. Mild to moderate spinal stenosis. Moderate subarticular stenosis bilaterally. No interval change   L2-3: Disc degeneration with diffuse disc bulging and endplate spurring. Mild facet degeneration. Mild subarticular stenosis bilaterally with mild progression   L3-4: Disc degeneration with diffuse disc bulging and spurring, right greater than left. Mild facet degeneration. Mild spinal stenosis. Moderate to severe right subarticular stenosis. Moderate left subarticular stenosis. No significant change   L4-5: 6 mm anterolisthesis with severe facet degeneration and diffuse bulging of the disc. Severe spinal stenosis and severe subarticular stenosis bilaterally have progressed since the prior study. In addition, there is severe right foraminal encroachment with right L4 nerve root impingement and moderate left foraminal encroachment.   L5-S1: Left paracentral disc protrusion with compression of the left S1 nerve root similar to the prior study. Mild facet degeneration bilaterally.   IMPRESSION: Advanced multilevel degenerative change throughout the lumbar spine. There has been progression of degenerative changes stenosis at multiple levels as described above   6 mm anterolisthesis L4-5. Severe spinal and severe subarticular stenosis bilaterally have progressed in the interval. Severe right foraminal encroachment.     Electronically Signed   By: Marlan Palau M.D.   On: 08/22/2020 21:23  PMFS History: Patient Active Problem List   Diagnosis Date Noted   DOE (dyspnea on exertion) 08/11/2023   Upper airway cough syndrome 08/11/2023   Slow heart rate 04/14/2022   Influenza B    AKI (acute kidney injury) (HCC)    CAP (community acquired pneumonia) 03/19/2017   Bilateral primary osteoarthritis of knee 01/28/2017   Impingement syndrome of right shoulder  01/28/2017   CAD - CABG X 2 9/07 LIMA-LAD, SVG-OM. Low risk Myoview 7/12 09/07/2013   Essential hypertension 09/07/2013   Dyslipidemia 09/07/2013   Diabetes mellitus (HCC) 09/07/2013   Chronic renal insufficiency, stage III (moderate)- SCr 1.6 (Aug 2013) 09/07/2013   Sleep apnea- compliant with C-pap 09/07/2013   PVD (peripheral vascular disease)- Rt iliac disease by doppler 7/13 09/07/2013   Past Medical History:  Diagnosis Date   Arthritis    Coronary artery disease 2007   cabg x3   Diabetes mellitus without complication (HCC)    Hyperlipidemia    Hypertension    Influenza B 03/19/2017   OSA on CPAP    Dr Tresa Endo - follows and treatmentof sleep apnea   PVD (peripheral vascular disease) (HCC) 2003   DR BERRY-  -LEFT LEG STENTING    Family History  Problem Relation Age of Onset   Cancer Father    Cancer Brother     Past Surgical History:  Procedure Laterality Date   CARDIAC CATHETERIZATION  0911/2007   3 vessel CAD WITH LEFT MAIN CAD SEVERE ,norm lLIMA,LV nom. ,evidence for very mild aortic stenosis with gradient, mild stenosis distal abdnminal aotra and proximal left  common iliac artery 30%   CORONARY ARTERY BYPASS GRAFT  08/31/2006   DR GERHARDT---LIMA to LAD, VEIN TO AN OBTUSE MARGINAL BRANCH AND  RIGHT CORONARY ARTERY   CPET  04/13/2012   normal   DOPPLER ECHOCARDIOGRAPHY  07/08/2011   EF =>55%   lower arterial doppler  07/01/2012   mildly abnormal lower extremity;ABIs >1 bilaterally with moderately high-grade right iliac stenosis that had not changed from proir study.   NM MYOCAR PERF WALL MOTION  7/182012   EF 59%, normal myocardial perfusio   Social History   Occupational History   Not on file  Tobacco Use   Smoking status: Former    Current packs/day: 0.00    Types: Cigarettes    Quit date: 09/03/1981    Years since quitting: 42.1   Smokeless tobacco: Current    Types: Chew  Substance and Sexual Activity   Alcohol use: Yes   Drug use: No   Sexual activity:  Not on file

## 2023-10-17 NOTE — Procedures (Signed)
Lumbosacral Transforaminal Epidural Steroid Injection - Sub-Pedicular Approach with Fluoroscopic Guidance  Patient: Rick Melendez      Date of Birth: April 04, 1936 MRN: [id number] PCP: Merri Brunette, MD      Visit Date: 10/04/2023   Universal Protocol:    Date/Time: 10/04/2023  Consent Given By: the patient  Position: PRONE  Additional Comments: Vital signs were monitored before and after the procedure. Patient was prepped and draped in the usual sterile fashion. The correct patient, procedure, and site was verified.   Injection Procedure Details:   Procedure diagnoses: Lumbar radiculopathy [M54.16]    Meds Administered:  Meds ordered this encounter  Medications   methylPREDNISolone acetate (DEPO-MEDROL) injection 40 mg    Laterality: Bilateral  Location/Site: L4  Needle:5.0 in., 22 ga.  Short bevel or Quincke spinal needle  Needle Placement: Transforaminal  Findings:    -Comments: Excellent flow of contrast along the nerve, nerve root and into the epidural space.  Procedure Details: After squaring off the end-plates to get a true AP view, the C-arm was positioned so that an oblique view of the foramen as noted above was visualized. The target area is just inferior to the "nose of the scotty dog" or sub pedicular. The soft tissues overlying this structure were infiltrated with 2-3 ml. of 1% Lidocaine without Epinephrine.  The spinal needle was inserted toward the target using a "trajectory" view along the fluoroscope beam.  Under AP and lateral visualization, the needle was advanced so it did not puncture dura and was located close the 6 O'Clock position of the pedical in AP tracterory. Biplanar projections were used to confirm position. Aspiration was confirmed to be negative for CSF and/or blood. A 1-2 ml. volume of Isovue-250 was injected and flow of contrast was noted at each level. Radiographs were obtained for documentation purposes.   After attaining the desired  flow of contrast documented above, a 0.5 to 1.0 ml test dose of 0.25% Marcaine was injected into each respective transforaminal space.  The patient was observed for 90 seconds post injection.  After no sensory deficits were reported, and normal lower extremity motor function was noted,   the above injectate was administered so that equal amounts of the injectate were placed at each foramen (level) into the transforaminal epidural space.   Additional Comments:  No complications occurred Dressing: 2 x 2 sterile gauze and Band-Aid    Post-procedure details: Patient was observed during the procedure. Post-procedure instructions were reviewed.  Patient left the clinic in stable condition.

## 2023-10-17 NOTE — Progress Notes (Signed)
Rick Melendez - 87 y.o. male MRN [id number]  Date of birth: [birth year]  Office Visit Note: Visit Date: 10/04/2023 PCP: Merri Brunette, MD Referred by: Merri Brunette, MD  Subjective: Chief Complaint  Patient presents with   Lower Back - Pain   HPI:  Rick Melendez is a 87 y.o. male who comes in today at the request of Ellin Goodie, FNP for planned Bilateral L4-5 Lumbar Transforaminal epidural steroid injection with fluoroscopic guidance.  The patient has failed conservative care including home exercise, medications, time and activity modification.  This injection will be diagnostic and hopefully therapeutic.  Please see requesting physician notes for further details and justification.   ROS Otherwise per HPI.  Assessment & Plan: Visit Diagnoses:    ICD-10-CM   1. Lumbar radiculopathy  M54.16 XR C-ARM NO REPORT    Epidural Steroid injection    methylPREDNISolone acetate (DEPO-MEDROL) injection 40 mg      Plan: No additional findings.   Meds & Orders:  Meds ordered this encounter  Medications   methylPREDNISolone acetate (DEPO-MEDROL) injection 40 mg    Orders Placed This Encounter  Procedures   XR C-ARM NO REPORT   Epidural Steroid injection    Follow-up: Return for visit to requesting provider as needed.   Procedures: No procedures performed  Lumbosacral Transforaminal Epidural Steroid Injection - Sub-Pedicular Approach with Fluoroscopic Guidance  Patient: Rick Melendez      Date of Birth: 08-Aug-1936 MRN: [id number] PCP: Merri Brunette, MD      Visit Date: 10/04/2023   Universal Protocol:    Date/Time: 10/04/2023  Consent Given By: the patient  Position: PRONE  Additional Comments: Vital signs were monitored before and after the procedure. Patient was prepped and draped in the usual sterile fashion. The correct patient, procedure, and site was verified.   Injection Procedure Details:   Procedure diagnoses: Lumbar radiculopathy [M54.16]    Meds  Administered:  Meds ordered this encounter  Medications   methylPREDNISolone acetate (DEPO-MEDROL) injection 40 mg    Laterality: Bilateral  Location/Site: L4  Needle:5.0 in., 22 ga.  Short bevel or Quincke spinal needle  Needle Placement: Transforaminal  Findings:    -Comments: Excellent flow of contrast along the nerve, nerve root and into the epidural space.  Procedure Details: After squaring off the end-plates to get a true AP view, the C-arm was positioned so that an oblique view of the foramen as noted above was visualized. The target area is just inferior to the "nose of the scotty dog" or sub pedicular. The soft tissues overlying this structure were infiltrated with 2-3 ml. of 1% Lidocaine without Epinephrine.  The spinal needle was inserted toward the target using a "trajectory" view along the fluoroscope beam.  Under AP and lateral visualization, the needle was advanced so it did not puncture dura and was located close the 6 O'Clock position of the pedical in AP tracterory. Biplanar projections were used to confirm position. Aspiration was confirmed to be negative for CSF and/or blood. A 1-2 ml. volume of Isovue-250 was injected and flow of contrast was noted at each level. Radiographs were obtained for documentation purposes.   After attaining the desired flow of contrast documented above, a 0.5 to 1.0 ml test dose of 0.25% Marcaine was injected into each respective transforaminal space.  The patient was observed for 90 seconds post injection.  After no sensory deficits were reported, and normal lower extremity motor function was noted,   the above injectate was administered so  that equal amounts of the injectate were placed at each foramen (level) into the transforaminal epidural space.   Additional Comments:  No complications occurred Dressing: 2 x 2 sterile gauze and Band-Aid    Post-procedure details: Patient was observed during the procedure. Post-procedure  instructions were reviewed.  Patient left the clinic in stable condition.    Clinical History: EXAM: MRI LUMBAR SPINE WITHOUT CONTRAST   TECHNIQUE: Multiplanar, multisequence MR imaging of the lumbar spine was performed. No intravenous contrast was administered.   COMPARISON:  Lumbar MRI 05/27/2014   FINDINGS: Segmentation:  Normal.  Lowest disc space L5-S1   Alignment: Mild anterolisthesis T12-L1. 4 mm retrolisthesis L1-2. Slight retrolisthesis L3-4. 6 mm anterolisthesis L4-5. Mild retrolisthesis L5-S1.   Vertebrae:  Normal bone marrow.  Negative for fracture or mass.   Conus medullaris and cauda equina: Conus extends to the L1-2 level. Conus and cauda equina appear normal.   Paraspinal and other soft tissues: Negative for mass or adenopathy. No soft tissue edema or fluid collection.   Disc levels:   T12-L1: Advanced disc degeneration left greater than right. Prominent left-sided spurring. Bilateral facet degeneration. Moderate spinal stenosis. Severe subarticular and foraminal stenosis on the left. Mild subarticular stenosis on the right. Mild progression of stenosis on the left since the prior study   L1-2: Advanced disc degeneration with diffuse endplate spurring and marked disc space narrowing. Mild facet degeneration. Mild to moderate spinal stenosis. Moderate subarticular stenosis bilaterally. No interval change   L2-3: Disc degeneration with diffuse disc bulging and endplate spurring. Mild facet degeneration. Mild subarticular stenosis bilaterally with mild progression   L3-4: Disc degeneration with diffuse disc bulging and spurring, right greater than left. Mild facet degeneration. Mild spinal stenosis. Moderate to severe right subarticular stenosis. Moderate left subarticular stenosis. No significant change   L4-5: 6 mm anterolisthesis with severe facet degeneration and diffuse bulging of the disc. Severe spinal stenosis and severe subarticular stenosis  bilaterally have progressed since the prior study. In addition, there is severe right foraminal encroachment with right L4 nerve root impingement and moderate left foraminal encroachment.   L5-S1: Left paracentral disc protrusion with compression of the left S1 nerve root similar to the prior study. Mild facet degeneration bilaterally.   IMPRESSION: Advanced multilevel degenerative change throughout the lumbar spine. There has been progression of degenerative changes stenosis at multiple levels as described above   6 mm anterolisthesis L4-5. Severe spinal and severe subarticular stenosis bilaterally have progressed in the interval. Severe right foraminal encroachment.     Electronically Signed   By: Marlan Palau M.D.   On: 08/22/2020 21:23     Objective:  VS:  HT:    WT:   BMI:     BP:96/63  HR:(!) 106bpm  TEMP: ( )  RESP:  Physical Exam Vitals and nursing note reviewed.  Constitutional:      General: He is not in acute distress.    Appearance: Normal appearance. He is not ill-appearing.  HENT:     Head: Normocephalic and atraumatic.     Right Ear: External ear normal.     Left Ear: External ear normal.     Nose: No congestion.  Eyes:     Extraocular Movements: Extraocular movements intact.  Cardiovascular:     Rate and Rhythm: Normal rate.     Pulses: Normal pulses.  Pulmonary:     Effort: Pulmonary effort is normal. No respiratory distress.  Abdominal:     General: There is no distension.  Palpations: Abdomen is soft.  Musculoskeletal:        General: No tenderness or signs of injury.     Cervical back: Neck supple.     Right lower leg: No edema.     Left lower leg: No edema.     Comments: Patient has good distal strength without clonus.  Skin:    Findings: No erythema or rash.  Neurological:     General: No focal deficit present.     Mental Status: He is alert and oriented to person, place, and time.     Sensory: No sensory deficit.     Motor:  No weakness or abnormal muscle tone.     Coordination: Coordination normal.  Psychiatric:        Mood and Affect: Mood normal.        Behavior: Behavior normal.      Imaging: No results found.

## 2023-11-01 ENCOUNTER — Encounter (HOSPITAL_COMMUNITY): Payer: Self-pay | Admitting: Emergency Medicine

## 2023-11-01 ENCOUNTER — Emergency Department (HOSPITAL_COMMUNITY): Payer: Medicare Other

## 2023-11-01 ENCOUNTER — Observation Stay (HOSPITAL_COMMUNITY)
Admission: EM | Admit: 2023-11-01 | Discharge: 2023-11-03 | Disposition: A | Discharge status: Home Health | Payer: Medicare Other | Attending: Internal Medicine | Admitting: Internal Medicine

## 2023-11-01 DIAGNOSIS — R2681 Unsteadiness on feet: Secondary | ICD-10-CM | POA: Insufficient documentation

## 2023-11-01 DIAGNOSIS — Z79899 Other long term (current) drug therapy: Secondary | ICD-10-CM | POA: Diagnosis not present

## 2023-11-01 DIAGNOSIS — R0902 Hypoxemia: Secondary | ICD-10-CM | POA: Diagnosis not present

## 2023-11-01 DIAGNOSIS — Z87891 Personal history of nicotine dependence: Secondary | ICD-10-CM | POA: Diagnosis not present

## 2023-11-01 DIAGNOSIS — Z7984 Long term (current) use of oral hypoglycemic drugs: Secondary | ICD-10-CM | POA: Diagnosis not present

## 2023-11-01 DIAGNOSIS — I10 Essential (primary) hypertension: Secondary | ICD-10-CM | POA: Diagnosis not present

## 2023-11-01 DIAGNOSIS — I4891 Unspecified atrial fibrillation: Secondary | ICD-10-CM | POA: Diagnosis not present

## 2023-11-01 DIAGNOSIS — I639 Cerebral infarction, unspecified: Principal | ICD-10-CM | POA: Diagnosis present

## 2023-11-01 DIAGNOSIS — Z951 Presence of aortocoronary bypass graft: Secondary | ICD-10-CM | POA: Insufficient documentation

## 2023-11-01 DIAGNOSIS — I499 Cardiac arrhythmia, unspecified: Secondary | ICD-10-CM | POA: Diagnosis not present

## 2023-11-01 DIAGNOSIS — E119 Type 2 diabetes mellitus without complications: Secondary | ICD-10-CM | POA: Diagnosis not present

## 2023-11-01 DIAGNOSIS — R41841 Cognitive communication deficit: Secondary | ICD-10-CM | POA: Insufficient documentation

## 2023-11-01 DIAGNOSIS — Z7982 Long term (current) use of aspirin: Secondary | ICD-10-CM | POA: Diagnosis not present

## 2023-11-01 DIAGNOSIS — R0682 Tachypnea, not elsewhere classified: Secondary | ICD-10-CM | POA: Diagnosis not present

## 2023-11-01 DIAGNOSIS — R2689 Other abnormalities of gait and mobility: Secondary | ICD-10-CM | POA: Diagnosis not present

## 2023-11-01 DIAGNOSIS — I6782 Cerebral ischemia: Secondary | ICD-10-CM | POA: Diagnosis not present

## 2023-11-01 DIAGNOSIS — Z7902 Long term (current) use of antithrombotics/antiplatelets: Secondary | ICD-10-CM | POA: Diagnosis not present

## 2023-11-01 DIAGNOSIS — R531 Weakness: Secondary | ICD-10-CM | POA: Diagnosis not present

## 2023-11-01 DIAGNOSIS — Z9181 History of falling: Secondary | ICD-10-CM | POA: Diagnosis not present

## 2023-11-01 DIAGNOSIS — I2510 Atherosclerotic heart disease of native coronary artery without angina pectoris: Secondary | ICD-10-CM | POA: Diagnosis not present

## 2023-11-01 DIAGNOSIS — R42 Dizziness and giddiness: Secondary | ICD-10-CM | POA: Diagnosis not present

## 2023-11-01 DIAGNOSIS — I517 Cardiomegaly: Secondary | ICD-10-CM | POA: Diagnosis not present

## 2023-11-01 DIAGNOSIS — G459 Transient cerebral ischemic attack, unspecified: Secondary | ICD-10-CM | POA: Diagnosis not present

## 2023-11-01 LAB — APTT: aPTT: 30 s (ref 24–36)

## 2023-11-01 LAB — CBC
HCT: 51.2 % (ref 39.0–52.0)
Hemoglobin: 15.3 g/dL (ref 13.0–17.0)
MCH: 25.1 pg — ABNORMAL LOW (ref 26.0–34.0)
MCHC: 29.9 g/dL — ABNORMAL LOW (ref 30.0–36.0)
MCV: 83.9 fL (ref 80.0–100.0)
Platelets: 253 10*3/uL (ref 150–400)
RBC: 6.1 MIL/uL — ABNORMAL HIGH (ref 4.22–5.81)
RDW: 16.1 % — ABNORMAL HIGH (ref 11.5–15.5)
WBC: 9.7 10*3/uL (ref 4.0–10.5)
nRBC: 0 % (ref 0.0–0.2)

## 2023-11-01 LAB — DIFFERENTIAL
Abs Immature Granulocytes: 0.03 10*3/uL (ref 0.00–0.07)
Basophils Absolute: 0.1 10*3/uL (ref 0.0–0.1)
Basophils Relative: 1 %
Eosinophils Absolute: 0.1 10*3/uL (ref 0.0–0.5)
Eosinophils Relative: 1 %
Immature Granulocytes: 0 %
Lymphocytes Relative: 28 %
Lymphs Abs: 2.7 10*3/uL (ref 0.7–4.0)
Monocytes Absolute: 1 10*3/uL (ref 0.1–1.0)
Monocytes Relative: 10 %
Neutro Abs: 5.9 10*3/uL (ref 1.7–7.7)
Neutrophils Relative %: 60 %

## 2023-11-01 LAB — COMPREHENSIVE METABOLIC PANEL
ALT: 20 U/L (ref 0–44)
AST: 26 U/L (ref 15–41)
Albumin: 3.3 g/dL — ABNORMAL LOW (ref 3.5–5.0)
Alkaline Phosphatase: 115 U/L (ref 38–126)
Anion gap: 12 (ref 5–15)
BUN: 25 mg/dL — ABNORMAL HIGH (ref 8–23)
CO2: 23 mmol/L (ref 22–32)
Calcium: 9.1 mg/dL (ref 8.9–10.3)
Chloride: 104 mmol/L (ref 98–111)
Creatinine, Ser: 1.23 mg/dL (ref 0.61–1.24)
GFR, Estimated: 58 mL/min — ABNORMAL LOW (ref >60)
Glucose, Bld: 123 mg/dL — ABNORMAL HIGH (ref 70–99)
Potassium: 4.1 mmol/L (ref 3.5–5.1)
Sodium: 139 mmol/L (ref 135–145)
Total Bilirubin: 0.6 mg/dL (ref <1.2)
Total Protein: 6.6 g/dL (ref 6.5–8.1)

## 2023-11-01 LAB — PROTIME-INR
INR: 1.1 (ref 0.8–1.2)
Prothrombin Time: 14.4 s (ref 11.4–15.2)

## 2023-11-01 LAB — ETHANOL: Alcohol, Ethyl (B): <10 mg/dL

## 2023-11-01 MED ORDER — SODIUM CHLORIDE 0.9% FLUSH: 3.0000 mL | Freq: Once | INTRAVENOUS | Status: DC

## 2023-11-01 NOTE — ED Triage Notes (Signed)
Pt here from md office with c/o weakness and dizziness that started this past sat , fell on sat hitting across the upper abd

## 2023-11-02 ENCOUNTER — Observation Stay (HOSPITAL_BASED_OUTPATIENT_CLINIC_OR_DEPARTMENT_OTHER): Payer: Medicare Other

## 2023-11-02 ENCOUNTER — Observation Stay (HOSPITAL_COMMUNITY): Payer: Medicare Other

## 2023-11-02 ENCOUNTER — Other Ambulatory Visit: Payer: Self-pay

## 2023-11-02 ENCOUNTER — Emergency Department (HOSPITAL_COMMUNITY): Payer: Medicare Other

## 2023-11-02 DIAGNOSIS — I639 Cerebral infarction, unspecified: Secondary | ICD-10-CM | POA: Diagnosis not present

## 2023-11-02 DIAGNOSIS — R0902 Hypoxemia: Secondary | ICD-10-CM | POA: Diagnosis not present

## 2023-11-02 DIAGNOSIS — I517 Cardiomegaly: Secondary | ICD-10-CM | POA: Diagnosis not present

## 2023-11-02 DIAGNOSIS — I6389 Other cerebral infarction: Secondary | ICD-10-CM

## 2023-11-02 DIAGNOSIS — R531 Weakness: Secondary | ICD-10-CM | POA: Diagnosis not present

## 2023-11-02 DIAGNOSIS — R42 Dizziness and giddiness: Secondary | ICD-10-CM | POA: Diagnosis not present

## 2023-11-02 DIAGNOSIS — I4891 Unspecified atrial fibrillation: Secondary | ICD-10-CM | POA: Diagnosis not present

## 2023-11-02 DIAGNOSIS — R0682 Tachypnea, not elsewhere classified: Secondary | ICD-10-CM | POA: Diagnosis not present

## 2023-11-02 DIAGNOSIS — Z0389 Encounter for observation for other suspected diseases and conditions ruled out: Secondary | ICD-10-CM | POA: Diagnosis not present

## 2023-11-02 DIAGNOSIS — I6782 Cerebral ischemia: Secondary | ICD-10-CM | POA: Diagnosis not present

## 2023-11-02 LAB — ECHOCARDIOGRAM COMPLETE BUBBLE STUDY
AR max vel: 1.15 cm2
AV Area VTI: 1.22 cm2
AV Area mean vel: 1.13 cm2
AV Mean grad: 10.8 mm[Hg]
AV Peak grad: 17.6 mm[Hg]
Ao pk vel: 2.1 m/s
Area-P 1/2: 4.96 cm2
S' Lateral: 3.1 cm

## 2023-11-02 LAB — HEMOGLOBIN A1C
Hgb A1c MFr Bld: 6.9 % — ABNORMAL HIGH (ref 4.8–5.6)
Mean Plasma Glucose: 151.33 mg/dL

## 2023-11-02 LAB — CBG MONITORING, ED
Glucose-Capillary: 145 mg/dL — ABNORMAL HIGH (ref 70–99)
Glucose-Capillary: 136 mg/dL — ABNORMAL HIGH (ref 70–99)

## 2023-11-02 LAB — GLUCOSE, CAPILLARY
Glucose-Capillary: 109 mg/dL — ABNORMAL HIGH (ref 70–99)
Glucose-Capillary: 136 mg/dL — ABNORMAL HIGH (ref 70–99)

## 2023-11-02 MED ORDER — ACETAMINOPHEN 650 MG RE SUPP: 650.0000 mg | Freq: Four times a day (QID) | RECTAL | Status: DC | PRN

## 2023-11-02 MED ORDER — METOPROLOL TARTRATE 12.5 MG HALF TABLET
12.5000 mg | ORAL_TABLET | Freq: Every day | ORAL | Status: DC
  Administered 2023-11-02 – 2023-11-03 (×2): 12.5 mg via ORAL
  Filled 2023-11-02 (×2): qty 1

## 2023-11-02 MED ORDER — CLOPIDOGREL BISULFATE 75 MG PO TABS
75.0000 mg | ORAL_TABLET | Freq: Every day | ORAL | Status: DC
  Administered 2023-11-02: 75 mg via ORAL
  Filled 2023-11-02: qty 1

## 2023-11-02 MED ORDER — ENOXAPARIN SODIUM 40 MG/0.4ML IJ SOSY
40.0000 mg | PREFILLED_SYRINGE | Freq: Every day | INTRAMUSCULAR | Status: DC
  Administered 2023-11-02: 40 mg via SUBCUTANEOUS
  Filled 2023-11-02: qty 0.4

## 2023-11-02 MED ORDER — APIXABAN 5 MG PO TABS: 5.0000 mg | ORAL_TABLET | Freq: Two times a day (BID) | ORAL | Status: DC

## 2023-11-02 MED ORDER — PERFLUTREN LIPID MICROSPHERE
1.0000 mL | INTRAVENOUS | Status: AC | PRN
  Administered 2023-11-02: 2 mL via INTRAVENOUS

## 2023-11-02 MED ORDER — ALBUTEROL SULFATE (2.5 MG/3ML) 0.083% IN NEBU
2.5000 mg | INHALATION_SOLUTION | Freq: Four times a day (QID) | RESPIRATORY_TRACT | Status: DC | PRN
Start: 2023-11-02 — End: 2023-11-03

## 2023-11-02 MED ORDER — EZETIMIBE 10 MG PO TABS
10.0000 mg | ORAL_TABLET | Freq: Every day | ORAL | Status: DC
  Administered 2023-11-02 – 2023-11-03 (×2): 10 mg via ORAL
  Filled 2023-11-02 (×2): qty 1

## 2023-11-02 MED ORDER — ASPIRIN 81 MG PO TBEC
81.0000 mg | DELAYED_RELEASE_TABLET | Freq: Every day | ORAL | Status: DC
  Administered 2023-11-02: 81 mg via ORAL
  Filled 2023-11-02: qty 1

## 2023-11-02 MED ORDER — ROSUVASTATIN CALCIUM 20 MG PO TABS
40.0000 mg | ORAL_TABLET | Freq: Every day | ORAL | Status: DC
Start: 2023-11-02 — End: 2023-11-03
  Administered 2023-11-02 – 2023-11-03 (×2): 40 mg via ORAL
  Filled 2023-11-02 (×2): qty 2

## 2023-11-02 MED ORDER — INSULIN ASPART 100 UNIT/ML IJ SOLN: 0.0000 [IU] | Freq: Every day | INTRAMUSCULAR | Status: DC

## 2023-11-02 MED ORDER — INSULIN ASPART 100 UNIT/ML IJ SOLN
0.0000 [IU] | Freq: Three times a day (TID) | INTRAMUSCULAR | Status: DC
  Administered 2023-11-02 – 2023-11-03 (×2): 1 [IU] via SUBCUTANEOUS

## 2023-11-02 MED ORDER — ACETAMINOPHEN 325 MG PO TABS: 650.0000 mg | ORAL_TABLET | Freq: Four times a day (QID) | ORAL | Status: DC | PRN

## 2023-11-02 MED ORDER — APIXABAN 5 MG PO TABS
5.0000 mg | ORAL_TABLET | Freq: Two times a day (BID) | ORAL | Status: DC
  Administered 2023-11-02 – 2023-11-03 (×2): 5 mg via ORAL
  Filled 2023-11-02 (×2): qty 1

## 2023-11-02 MED ORDER — HYDRALAZINE HCL 20 MG/ML IJ SOLN: 10.0000 mg | Freq: Four times a day (QID) | INTRAMUSCULAR | Status: DC | PRN

## 2023-11-02 NOTE — Consult Note (Signed)
NEUROLOGY CONSULT NOTE   Date of service: November 02, 2023 Patient Name: Rick Melendez MRN:  [id number] DOB:  June 29, 1936 Chief Complaint: "weakness and dizziness" Requesting Provider: Virgina Norfolk, DO  History of Present Illness  Rick Melendez is an 87 y.o. male with a PMH significant for DM, HTN, HLD, OSA on CPAP, PVD and CAD who presented on 11/12 to the [facility] c/o weakness and dizziness. Stated that he felt dizzy upon waking Saturday morning and fell, hitting his abdomen on a dresser on the way down. Denied feeling weaker on one side.Took one of his wife's meclizine (history of vertigo) because he thought it might have been due to vertigo. Patient then slept all day and was weaker when he woke up, only able to use walker. Weakness persisted throughout the weekend. Patient was sent here after seeing his doctor. C/o worsening weakness and now right leg weakness.   MRI completed, shows small acute pontine infarct. Neurology consulted. LKW: Friday night. Outside of window for mechanical or IV thrombolytic. No LVO suspected due to low NIH, resolving symptoms.   On exam, patient is sitting up in bed with wife at bedside. Denies dizziness, weakness at this time. Oriented to self, place, (off on day), follows commands, slight dysarthria (wife says sometimes his speech sounds like that at baseline), slight right facial droop (wife states that is baseline), no focal weakness. NIH: 2 (probable baseline findings). Discussed MRI, assessment and recommendations.   ROS  Comprehensive ROS performed and pertinent positives documented in HPI    Past History   Past Medical History:  Diagnosis Date   Arthritis    Coronary artery disease 2007   cabg x3   Diabetes mellitus without complication (HCC)    Hyperlipidemia    Hypertension    Influenza B 03/19/2017   OSA on CPAP    Dr Tresa Endo - follows and treatmentof sleep apnea   PVD (peripheral vascular disease) (HCC) 2003   DR BERRY-  -LEFT LEG STENTING     Past Surgical History:  Procedure Laterality Date   CARDIAC CATHETERIZATION  0911/2007   3 vessel CAD WITH LEFT MAIN CAD SEVERE ,norm lLIMA,LV nom. ,evidence for very mild aortic stenosis with gradient, mild stenosis distal abdnminal aotra and proximal left common iliac artery 30%   CORONARY ARTERY BYPASS GRAFT  08/31/2006   DR GERHARDT---LIMA to LAD, VEIN TO AN OBTUSE MARGINAL BRANCH AND  RIGHT CORONARY ARTERY   CPET  04/13/2012   normal   DOPPLER ECHOCARDIOGRAPHY  07/08/2011   EF =>55%   lower arterial doppler  07/01/2012   mildly abnormal lower extremity;ABIs >1 bilaterally with moderately high-grade right iliac stenosis that had not changed from proir study.   NM MYOCAR PERF WALL MOTION  7/182012   EF 59%, normal myocardial perfusio    Family History: Family History  Problem Relation Age of Onset   Cancer Father    Cancer Brother     Social History  reports that he quit smoking about 42 years ago. His smoking use included cigarettes. His smokeless tobacco use includes chew. He reports current alcohol use. He reports that he does not use drugs.  Allergies  Allergen Reactions   Ozempic (0.25 Or 0.5 Mg-Dose) [Semaglutide(0.25 Or 0.5mg -Dos)] Other (See Comments)    Hallucinations, lethargic     Medications   Current Facility-Administered Medications:    sodium chloride flush (NS) 0.9 % injection 3 mL, 3 mL, Intravenous, Once, Mesner, Barbara Cower, MD  Current Outpatient Medications:    albuterol (  PROVENTIL HFA) 108 (90 Base) MCG/ACT inhaler, Inhale 2 puffs into the lungs every 4 (four) hours as needed for wheezing or shortness of breath., Disp: , Rfl:    aspirin EC 81 MG tablet, Take 81 mg by mouth daily., Disp: , Rfl:    clopidogrel (PLAVIX) 75 MG tablet, Take 75 mg by mouth daily., Disp: , Rfl:    Cyanocobalamin (B-12 PO), Take 1 tablet by mouth daily., Disp: , Rfl:    ezetimibe (ZETIA) 10 MG tablet, Take 10 mg by mouth daily., Disp: , Rfl:    famotidine (PEPCID) 20 MG  tablet, Take 20 mg by mouth 2 (two) times daily., Disp: , Rfl:    FARXIGA 10 MG TABS tablet, Take 10 mg by mouth daily., Disp: , Rfl:    furosemide (LASIX) 20 MG tablet, Take 20 mg by mouth daily., Disp: , Rfl: 5   ipratropium (ATROVENT) 0.06 % nasal spray, Place 1 spray into the nose 3 (three) times daily as needed for rhinitis., Disp: , Rfl:    KERENDIA 10 MG TABS, Take 10 mg by mouth daily., Disp: , Rfl:    meclizine (ANTIVERT) 25 MG tablet, Take 25 mg by mouth 3 (three) times daily as needed for dizziness., Disp: , Rfl:    memantine (NAMENDA) 5 MG tablet, Take 1 tablet  twice a day, Disp: 180 tablet, Rfl: 3   metoprolol tartrate (LOPRESSOR) 25 MG tablet, Take 12.5 mg by mouth daily. , Disp: , Rfl:    rosuvastatin (CRESTOR) 40 MG tablet, Take 40 mg by mouth daily., Disp: , Rfl:    traMADol (ULTRAM) 50 MG tablet, Take 50-100 mg by mouth See admin instructions. Take 50mg  (1 tablet) by mouth two to three times a day., Disp: , Rfl:    doxycycline (VIBRAMYCIN) 100 MG capsule, Take 100 mg by mouth 2 (two) times daily. (Patient not taking: Reported on 11/02/2023), Disp: , Rfl:    OVER THE COUNTER MEDICATION, USES CPAP NIGHTLY, Disp: , Rfl:   Vitals   Vitals:   11/02/23 0630 11/02/23 0645 11/02/23 0700 11/02/23 0714  BP: 125/77 129/74 117/71   Pulse: 93 (!) 107 94   Resp: (!) 28 (!) 26 (!) 26   Temp:    98.7 F (37.1 C)  TempSrc:    Oral  SpO2: 93% 92% 93%     There is no height or weight on file to calculate BMI.  Physical Exam   Constitutional: Appears well-developed and well-nourished.  Psych: Affect appropriate to situation.  Eyes: No scleral injection.  HENT: No OP obstruction.  Head: Normocephalic.  Cardiovascular: controlled rate and irregular rhythm.  Respiratory: Effort normal, non-labored breathing.  GI: Soft.  No distension. There is no tenderness.  Skin: WDI.   Neurologic Examination   Mental Status: Patient is awake, alert, oriented to person, place, month (off on  day of week), year, and situation. Patient is able to give a somewhat clear and coherent history. No signs of aphasia or neglect. Slight dysarthria noted. However, wife states that he sometimes speaks like this at baseline.  Cranial Nerves: II: Visual Fields are full. Pupils are equal, round, and reactive to light.   III,IV, VI: EOMI without ptosis or diplopia.  V: Facial sensation is symmetric to temperature VII: Slight right facial droop? (Wife states this is baseline) VIII: hearing is intact to voice X: Palate elevates symmetrically XI: Shoulder shrug is symmetric. XII: Tongue is midline without atrophy or fasciculations.  Motor: Tone is normal. Bulk is normal. 5/5  strength was present in all four extremities.  (Slight decrease in knee flexion/extension in LLE due to knee pain) Sensory: Sensation is symmetric to light touch and temperature in the arms and legs. Cerebellar: FNF intact bilaterally. HKS decreased in LLE due to knee pain.   Labs/Imaging/Neurodiagnostic studies   CBC:  Recent Labs  Lab 24-Nov-2023 1634  WBC 9.7  NEUTROABS 5.9  HGB 15.3  HCT 51.2  MCV 83.9  PLT 253    Basic Metabolic Panel:  Lab Results  Component Value Date   NA 139 2023-11-24   K 4.1 11/24/2023   CO2 23 11/24/23   GLUCOSE 123 (H) 11/24/23   BUN 25 (H) 11/24/23   CREATININE 1.23 11-24-2023   CALCIUM 9.1 11-24-23   GFRNONAA 58 (L) 11-24-2023   GFRAA 48 (L) 03/20/2017    Lipid Panel: No results found for: "LDLCALC"  HgbA1c: No results found for: "HGBA1C"  Urine Drug Screen: No results found for: "LABOPIA", "COCAINSCRNUR", "LABBENZ", "AMPHETMU", "THCU", "LABBARB"   Alcohol Level     Component Value Date/Time   ETH <10 11-24-23 1634    INR  Lab Results  Component Value Date   INR 1.1 24-Nov-2023    APTT  Lab Results  Component Value Date   APTT 30 11/24/23    AED levels: No results found for: "PHENYTOIN", "ZONISAMIDE", "LAMOTRIGINE", "LEVETIRACETA"  CT Head  without contrast (Personally reviewed): No evidence of acute intracranial abnormality Mild chronic small vessel disease.    MRI Brain(Personally reviewed): Small acute right pons infarct   Impression  Rick Melendez is a 87 y.o. male with PMH significant for DM, HTN, HLD, OSA on CPAP, PVD, CAD, DM who presented 11/12 to [facility] c/o weakness and dizziness.  - On exam, patient is sitting up in bed with wife at bedside. Denies dizziness, weakness at this time. Oriented to self, place, (off on day), follows commands, slight dysarthria (wife says sometimes his speech sounds like that at baseline), slight right facial droop (wife states that is baseline), no focal weakness. NIH: 2 (probable baseline findings).  - Stroke risk factors: As above - MRI brain: Small acute right pontine ischemic infarction.   Recommendations  - Frequent Neuro checks per stroke unit protocol - Vascular imaging - Carotid US and MRA head without contrast - TTE - Lipid panel - Statin - Patient is already on Crestor 40mg    - A1C - Antithrombotic - ASA/Plavix given. Start Eliquis tonight due to confirmed new-onset Afib. May need to continue ASA due to CAD. Plavix most likely should be discontinued while on Eliquis. Will defer to Stroke Team for final recommendations.  - DVT ppx - lovenox, which can be stopped after Eliquis is started.  - Smoking cessation - will counsel patient - SBP goal - normotensive, as patient is outside of the permissive hypertension window with LKW being Friday night.  - Telemetry monitoring for arrhythmia - 72h - Swallow screen - will be performed prior to PO intake - Stroke education - will be given - PT/OT/SLP - Dispo: admit to medical for stroke work-up  - Stroke team will follow beginning tomorrow AM.  ______________________________________________________________________    Pt seen by Neuro NP/APP and later by MD. Note/plan to be edited by MD as needed.    Lynnae January, DNP,  AGACNP-BC [facility] Please use AMION for contact information & EPIC for messaging.   I have seen and examined the patient. I have formulated the assessment and recommendations. 87 year old male presenting with  acute punctate right pontine ischemic infarction. Exam is essentially unremarkable. Recommendations for stroke work up and management as above. Has new-onset atrial fibrillation, which will require initiation of Eliquis.  Electronically signed: Dr. Caryl Pina

## 2023-11-02 NOTE — Progress Notes (Signed)
PHARMACY - ANTICOAGULATION CONSULT NOTE  Pharmacy Consult for Eliquis Indication: atrial fibrillation  Allergies  Allergen Reactions   Ozempic (0.25 Or 0.5 Mg-Dose) [Semaglutide(0.25 Or 0.5mg -Dos)] Other (See Comments)    Hallucinations, lethargic      Vital Signs: Temp: 98.3 F (36.8 C) (11/12 1126) Temp Source: Oral (11/12 1126) BP: 140/91 (11/12 1126) Pulse Rate: 99 (11/12 1126)  Labs: Recent Labs    11/01/23 1634  HGB 15.3  HCT 51.2  PLT 253  APTT 30  LABPROT 14.4  INR 1.1  CREATININE 1.23    CrCl cannot be calculated (Unknown ideal weight.).   Medical History: Past Medical History:  Diagnosis Date   Arthritis    Coronary artery disease 2007   cabg x3   Diabetes mellitus without complication (HCC)    Hyperlipidemia    Hypertension    Influenza B 03/19/2017   OSA on CPAP    Dr Tresa Endo - follows and treatmentof sleep apnea   PVD (peripheral vascular disease) (HCC) 2003   DR BERRY-  -LEFT LEG STENTING     Assessment:87yoM with new onset atrial fibrillation. Pharmacy consulted to start Eliquis. Previously on DAPT with plavix and aspirin, but neurology has stopped both in favor of anticoagulation. One dose of Lovenox given this morning, pharmacy requested to dose Eliquis starting tonight.     Plan:  Eliquis 5mg  BID Monitor s/sx of bleeding  Alinda Money L Xachary Hambly 11/02/2023,12:05 PM

## 2023-11-02 NOTE — ED Notes (Signed)
ED TO INPATIENT HANDOFF REPORT  ED Nurse Name and Phone #: Jesse Sans, [phone]  S Name/Age/Gender Rick Melendez 87 y.o. male Room/Bed: 046C/046C  Code Status   Code Status: Full Code  Home/SNF/Other Home Patient oriented to: self, place, time, and situation Is this baseline? Yes   Triage Complete: Triage complete  Chief Complaint Acute stroke due to ischemia [facility]) [I63.9]  Triage Note Pt here from md office with c/o weakness and dizziness that started this past sat , fell on sat hitting across the upper abd    Allergies Allergies  Allergen Reactions   Ozempic (0.25 Or 0.5 Mg-Dose) [Semaglutide(0.25 Or 0.5mg -Dos)] Other (See Comments)    Hallucinations, lethargic     Level of Care/Admitting Diagnosis ED Disposition     ED Disposition  Admit   Condition  --   Comment  Hospital Area: [facility] [[id number]]  Level of Care: Telemetry Medical [104]  May place patient in observation at [facility] or [facility] if equivalent level of care is available:: No  Covid Evaluation: Asymptomatic - no recent exposure (last 10 days) testing not required  Diagnosis: Acute stroke due to ischemia [facility]) [[id number]]  Admitting Physician: Lorin Glass [[id number]]  Attending Physician: Lorin Glass [[id number]]          B Medical/Surgery History Past Medical History:  Diagnosis Date   Arthritis    Coronary artery disease 2007   cabg x3   Diabetes mellitus without complication (HCC)    Hyperlipidemia    Hypertension    Influenza B 03/19/2017   OSA on CPAP    Dr Tresa Endo - follows and treatmentof sleep apnea   PVD (peripheral vascular disease) (HCC) 2003   DR BERRY-  -LEFT LEG STENTING   Past Surgical History:  Procedure Laterality Date   CARDIAC CATHETERIZATION  0911/2007   3 vessel CAD WITH LEFT MAIN CAD SEVERE ,norm lLIMA,LV nom. ,evidence for very mild aortic stenosis with gradient, mild stenosis distal abdnminal aotra and proximal left common iliac artery  30%   CORONARY ARTERY BYPASS GRAFT  08/31/2006   DR Lorrin Jackson to LAD, VEIN TO AN OBTUSE MARGINAL BRANCH AND  RIGHT CORONARY ARTERY   CPET  04/13/2012   normal   DOPPLER ECHOCARDIOGRAPHY  07/08/2011   EF =>55%   lower arterial doppler  07/01/2012   mildly abnormal lower extremity;ABIs >1 bilaterally with moderately high-grade right iliac stenosis that had not changed from proir study.   NM MYOCAR PERF WALL MOTION  7/182012   EF 59%, normal myocardial perfusio     A IV Location/Drains/Wounds Patient Lines/Drains/Airways Status     Active Line/Drains/Airways     Name Placement date Placement time Site Days   Peripheral IV 11/02/23 20 G Anterior;Distal;Right;Upper Arm 11/02/23  0558  Arm  less than 1            Intake/Output Last 24 hours No intake or output data in the 24 hours ending 11/02/23 1531  Labs/Imaging Results for orders placed or performed during the hospital encounter of 11/01/23 (from the past 48 hour(s))  Protime-INR     Status: None   Collection Time: 11/01/23  4:34 PM  Result Value Ref Range   Prothrombin Time 14.4 11.4 - 15.2 seconds   INR 1.1 0.8 - 1.2    Comment: (NOTE) INR goal varies based on device and disease states. Performed at [facility], [address] [place], [place] [address]   APTT     Status: None  Collection Time: 11/01/23  4:34 PM  Result Value Ref Range   aPTT 30 24 - 36 seconds    Comment: Performed at [facility], [address], [place], [place] [address]  CBC     Status: Abnormal   Collection Time: 11/01/23  4:34 PM  Result Value Ref Range   WBC 9.7 4.0 - 10.5 K/uL   RBC 6.10 (H) 4.22 - 5.81 MIL/uL   Hemoglobin 15.3 13.0 - 17.0 g/dL   HCT 19.1 47.8 - 29.5 %   MCV 83.9 80.0 - 100.0 fL   MCH 25.1 (L) 26.0 - 34.0 pg   MCHC 29.9 (L) 30.0 - 36.0 g/dL   RDW 62.1 (H) 30.8 - 65.7 %   Platelets 253 150 - 400 K/uL   nRBC 0.0 0.0 - 0.2 %    Comment: Performed at [facility], [address],  [place], [place] [address]  Differential     Status: None   Collection Time: 11/01/23  4:34 PM  Result Value Ref Range   Neutrophils Relative % 60 %   Neutro Abs 5.9 1.7 - 7.7 K/uL   Lymphocytes Relative 28 %   Lymphs Abs 2.7 0.7 - 4.0 K/uL   Monocytes Relative 10 %   Monocytes Absolute 1.0 0.1 - 1.0 K/uL   Eosinophils Relative 1 %   Eosinophils Absolute 0.1 0.0 - 0.5 K/uL   Basophils Relative 1 %   Basophils Absolute 0.1 0.0 - 0.1 K/uL   Immature Granulocytes 0 %   Abs Immature Granulocytes 0.03 0.00 - 0.07 K/uL    Comment: Performed at [facility], [address], [place], [place] [address]  Comprehensive metabolic panel     Status: Abnormal   Collection Time: 11/01/23  4:34 PM  Result Value Ref Range   Sodium 139 135 - 145 mmol/L   Potassium 4.1 3.5 - 5.1 mmol/L   Chloride 104 98 - 111 mmol/L   CO2 23 22 - 32 mmol/L   Glucose, Bld 123 (H) 70 - 99 mg/dL    Comment: Glucose reference range applies only to samples taken after fasting for at least 8 hours.   BUN 25 (H) 8 - 23 mg/dL   Creatinine, Ser 4.13 0.61 - 1.24 mg/dL   Calcium 9.1 8.9 - 24.4 mg/dL   Total Protein 6.6 6.5 - 8.1 g/dL   Albumin 3.3 (L) 3.5 - 5.0 g/dL   AST 26 15 - 41 U/L   ALT 20 0 - 44 U/L   Alkaline Phosphatase 115 38 - 126 U/L   Total Bilirubin 0.6 <1.2 mg/dL   GFR, Estimated 58 (L) >60 mL/min    Comment: (NOTE) Calculated using the CKD-EPI Creatinine Equation (2021)    Anion gap 12 5 - 15    Comment: Performed at [facility], [address] [place], [place] [address]  Ethanol     Status: None   Collection Time: 11/01/23  4:34 PM  Result Value Ref Range   Alcohol, Ethyl (B) <10 <10 mg/dL    Comment: (NOTE) Lowest detectable limit for serum alcohol is 10 mg/dL.  For medical purposes only. Performed at [facility], [address], [place], [place] [address]   Hemoglobin A1c     Status: Abnormal   Collection Time: 11/01/23  4:34 PM  Result Value Ref Range   Hgb A1c MFr Bld 6.9  (H) 4.8 - 5.6 %    Comment: (NOTE) Pre diabetes:  5.7%-6.4%  Diabetes:              >6.4%  Glycemic control for   <7.0% adults with diabetes    Mean Plasma Glucose 151.33 mg/dL    Comment: Performed at [facility], [address] [place], [place] [address]  CBG monitoring, ED     Status: Abnormal   Collection Time: 11/02/23  4:04 AM  Result Value Ref Range   Glucose-Capillary 145 (H) 70 - 99 mg/dL    Comment: Glucose reference range applies only to samples taken after fasting for at least 8 hours.  CBG monitoring, ED     Status: Abnormal   Collection Time: 11/02/23 12:10 PM  Result Value Ref Range   Glucose-Capillary 136 (H) 70 - 99 mg/dL    Comment: Glucose reference range applies only to samples taken after fasting for at least 8 hours.   VAS US CAROTID  Result Date: 11/02/2023 Carotid Arterial Duplex Study Patient Name:  Rick Melendez  Date of Exam:   11/02/2023 Medical Rec #: [id number]       Accession #:    [id number] Date of Birth: [birth year]       Patient Gender: M Patient Age:   43 years Exam Location:  [facility] Procedure:      VAS US CAROTID Referring Phys: Lorin Glass --------------------------------------------------------------------------------  Indications:       CVA. Risk Factors:      Hypertension, hyperlipidemia, Diabetes, coronary artery                    disease, PAD. Comparison Study:  No prior exam. Performing Technologist: Fernande Bras  Examination Guidelines: A complete evaluation includes B-mode imaging, spectral Doppler, color Doppler, and power Doppler as needed of all accessible portions of each vessel. Bilateral testing is considered an integral part of a complete examination. Limited examinations for reoccurring indications may be performed as noted.  Right Carotid Findings: +----------+--------+--------+--------+-------------------------+--------+           PSV cm/sEDV cm/sStenosisPlaque Description       Comments  +----------+--------+--------+--------+-------------------------+--------+ CCA Prox  92      16                                                +----------+--------+--------+--------+-------------------------+--------+ CCA Distal106     17                                                +----------+--------+--------+--------+-------------------------+--------+ ICA Prox  65      21              heterogenous and calcific         +----------+--------+--------+--------+-------------------------+--------+ ICA Mid   76      20                                                +----------+--------+--------+--------+-------------------------+--------+ ICA Distal70      24  tortuous +----------+--------+--------+--------+-------------------------+--------+ ECA       104     13                                                +----------+--------+--------+--------+-------------------------+--------+ +----------+--------+-------+--------+-------------------+           PSV cm/sEDV cmsDescribeArm Pressure (mmHG) +----------+--------+-------+--------+-------------------+ [id number]      15                                 +----------+--------+-------+--------+-------------------+ +---------+--------+--+--------+-+ VertebralPSV cm/s31EDV cm/s9 +---------+--------+--+--------+-+  Left Carotid Findings: +----------+--------+--------+--------+-------------------------+--------+           PSV cm/sEDV cm/sStenosisPlaque Description       Comments +----------+--------+--------+--------+-------------------------+--------+ CCA Prox  120     26                                                +----------+--------+--------+--------+-------------------------+--------+ CCA Distal63      13                                                +----------+--------+--------+--------+-------------------------+--------+ ICA Prox  122     28               heterogenous and calcific         +----------+--------+--------+--------+-------------------------+--------+ ICA Mid   92      27                                                +----------+--------+--------+--------+-------------------------+--------+ ICA Distal64      21                                       tortuous +----------+--------+--------+--------+-------------------------+--------+ ECA       102     13                                                +----------+--------+--------+--------+-------------------------+--------+ +----------+--------+--------+--------+-------------------+           PSV cm/sEDV cm/sDescribeArm Pressure (mmHG) +----------+--------+--------+--------+-------------------+ [id number]                                          +----------+--------+--------+--------+-------------------+ +---------+--------+--+--------+-+ VertebralPSV cm/s34EDV cm/s8 +---------+--------+--+--------+-+   Summary: Right Carotid: Velocities in the right ICA are consistent with a 1-39% stenosis.                The ECA appears <50% stenosed. Left Carotid: Velocities in the left ICA are consistent with a 1-39% stenosis.               The ECA appears <50%  stenosed. Vertebrals:  Bilateral vertebral arteries demonstrate antegrade flow. Subclavians: Normal flow hemodynamics were seen in bilateral subclavian              arteries. *See table(s) above for measurements and observations.     Preliminary    ECHOCARDIOGRAM COMPLETE BUBBLE STUDY  Result Date: 11/02/2023    ECHOCARDIOGRAM REPORT   Patient Name:   Rick Melendez Date of Exam: 11/02/2023 Medical Rec #:  [id number]      Height:       67.0 in Accession #:    [id number]     Weight:       202.0 lb Date of Birth:  December 25, 1935      BSA:          2.031 m Patient Age:    87 years       BP:           140/70 mmHg Patient Gender: M              HR:           96 bpm. Exam Location:  Inpatient Procedure: 2D Echo,  Cardiac Doppler, Color Doppler, Saline Contrast Bubble Study            and Intracardiac Opacification Agent Indications:    Stroke  History:        Patient has no prior history of Echocardiogram examinations.                 CAD; Risk Factors:Hypertension and Diabetes.  Sonographer:    Darlys Gales Referring Phys: [id number] BINAYA DAHAL IMPRESSIONS  1. Left ventricular ejection fraction, by estimation, is 60 to 65%. The left ventricle has normal function. The left ventricle has no regional wall motion abnormalities. There is moderate concentric left ventricular hypertrophy. Left ventricular diastolic function could not be evaluated.  2. Right ventricular systolic function is normal. The right ventricular size is normal. Tricuspid regurgitation signal is inadequate for assessing PA pressure.  3. The mitral valve is degenerative. No evidence of mitral valve regurgitation. No evidence of mitral stenosis. Severe mitral annular calcification.  4. The aortic valve was not well visualized. Aortic valve regurgitation is not visualized. Aortic valve area, by VTI measures 1.22 cm. Aortic valve mean gradient measures 10.8 mmHg. Aortic valve Vmax measures 2.10 m/s.  5. Aortic dilatation noted. There is mild dilatation of the aortic root, measuring 43 mm.  6. The inferior vena cava is normal in size with greater than 50% respiratory variability, suggesting right atrial pressure of 3 mmHg.  7. Agitated saline contrast bubble study was negative, with no evidence of any interatrial shunt. Conclusion(s)/Recommendation(s): No intracardiac source of embolism detected on this transthoracic study. Consider a transesophageal echocardiogram to exclude cardiac source of embolism if clinically indicated. FINDINGS  Left Ventricle: Left ventricular ejection fraction, by estimation, is 60 to 65%. The left ventricle has normal function. The left ventricle has no regional wall motion abnormalities. Definity contrast agent was given IV to  delineate the left ventricular  endocardial borders. The left ventricular internal cavity size was normal in size. There is moderate concentric left ventricular hypertrophy. Left ventricular diastolic function could not be evaluated due to atrial fibrillation. Left ventricular diastolic function could not be evaluated. Right Ventricle: The right ventricular size is normal. No increase in right ventricular wall thickness. Right ventricular systolic function is normal. Tricuspid regurgitation signal is inadequate for assessing PA pressure. Left Atrium: Left atrial size was normal in size. Right Atrium: Right  atrial size was normal in size. Pericardium: There is no evidence of pericardial effusion. Mitral Valve: The mitral valve is degenerative in appearance. Severe mitral annular calcification. No evidence of mitral valve regurgitation. No evidence of mitral valve stenosis. Tricuspid Valve: The tricuspid valve is normal in structure. Tricuspid valve regurgitation is not demonstrated. No evidence of tricuspid stenosis. Aortic Valve: The aortic valve was not well visualized. Aortic valve regurgitation is not visualized. Aortic valve mean gradient measures 10.8 mmHg. Aortic valve peak gradient measures 17.6 mmHg. Aortic valve area, by VTI measures 1.22 cm. Pulmonic Valve: The pulmonic valve was normal in structure. Pulmonic valve regurgitation is not visualized. No evidence of pulmonic stenosis. Aorta: Aortic dilatation noted. There is mild dilatation of the aortic root, measuring 43 mm. Venous: The inferior vena cava is normal in size with greater than 50% respiratory variability, suggesting right atrial pressure of 3 mmHg. IAS/Shunts: No atrial level shunt detected by color flow Doppler. Agitated saline contrast was given intravenously to evaluate for intracardiac shunting. Agitated saline contrast bubble study was negative, with no evidence of any interatrial shunt.  LEFT VENTRICLE PLAX 2D LVIDd:         4.10 cm    Diastology LVIDs:         3.10 cm   LV e' medial:    6.09 cm/s LV PW:         1.50 cm   LV E/e' medial:  12.9 LV IVS:        1.30 cm   LV e' lateral:   8.16 cm/s LVOT diam:     2.00 cm   LV E/e' lateral: 9.6 LV SV:         42 LV SV Index:   21 LVOT Area:     3.14 cm  LEFT ATRIUM             Index LA Vol (A2C):   52.8 ml 26.00 ml/m LA Vol (A4C):   44.4 ml 21.86 ml/m LA Biplane Vol: 50.6 ml 24.92 ml/m  AORTIC VALVE AV Area (Vmax):    1.15 cm AV Area (Vmean):   1.13 cm AV Area (VTI):     1.22 cm AV Vmax:           210.00 cm/s AV Vmean:          149.750 cm/s AV VTI:            0.346 m AV Peak Grad:      17.6 mmHg AV Mean Grad:      10.8 mmHg LVOT Vmax:         77.00 cm/s LVOT Vmean:        53.900 cm/s LVOT VTI:          0.134 m LVOT/AV VTI ratio: 0.39  AORTA Ao Root diam: 4.30 cm Ao Asc diam:  3.70 cm MITRAL VALVE MV Area (PHT): 4.96 cm    SHUNTS MV Decel Time: 153 msec    Systemic VTI:  0.13 m MV E velocity: 78.40 cm/s  Systemic Diam: 2.00 cm Armanda Magic MD Electronically signed by Armanda Magic MD Signature Date/Time: 11/02/2023/12:00:20 PM    Final    MR BRAIN WO CONTRAST  Result Date: 11/02/2023 CLINICAL DATA:  Weakness and dizziness that started on Saturday. EXAM: MRI HEAD WITHOUT CONTRAST TECHNIQUE: Multiplanar, multiecho pulse sequences of the brain and surrounding structures were obtained without intravenous contrast. COMPARISON:  Head CT from yesterday FINDINGS: Brain: Small acute infarct in the right paramedian pons. Chronic  small vessel ischemic gliosis in the periventricular white matter and to a lesser extent in the pons. Mild for age cerebral volume loss which is generalized. No acute hemorrhage, hydrocephalus, or masslike finding. Vascular: Major flow voids are preserved Skull and upper cervical spine: Normal marrow signal Sinuses/Orbits: Mucosal thickening and debris in the left maxillary sinus where there is atelectasis, chronic findings. Bilateral cataract resection. IMPRESSION: 1. Small  acute infarct in the right paramedian pons. 2. Aging brain. Electronically Signed   By: Tiburcio Pea M.D.   On: 11/02/2023 07:53   DG Chest Portable 1 View  Result Date: 11/02/2023 CLINICAL DATA:  Tachypnea and relative hypoxia. EXAM: PORTABLE CHEST 1 VIEW COMPARISON:  03/19/2017 FINDINGS: Chronic interstitial coarsening. Chronic cardiomegaly. There has been prior CABG. No edema, effusion, or pneumothorax. Artifact from EKG leads. IMPRESSION: No acute finding when compared to 2018. Cardiomegaly. Electronically Signed   By: Tiburcio Pea M.D.   On: 11/02/2023 07:49   CT HEAD WO CONTRAST  Result Date: 11/01/2023 CLINICAL DATA:  Transient ischemic attack (TIA). Dizziness and weakness. EXAM: CT HEAD WITHOUT CONTRAST TECHNIQUE: Contiguous axial images were obtained from the base of the skull through the vertex without intravenous contrast. RADIATION DOSE REDUCTION: This exam was performed according to the departmental dose-optimization program which includes automated exposure control, adjustment of the mA and/or kV according to patient size and/or use of iterative reconstruction technique. COMPARISON:  None Available. FINDINGS: Brain: There is no evidence of an acute infarct, intracranial hemorrhage, mass, midline shift, or extra-axial fluid collection. Cerebral white matter hypodensities are nonspecific but compatible with mild chronic small vessel ischemic disease. There is a chronic right basal ganglia lacunar infarct. Mild cerebral atrophy is within normal limits for age. Vascular: Calcified atherosclerosis at the skull base. No hyperdense vessel. Skull: No acute fracture or suspicious osseous lesion. Sinuses/Orbits: Subtotal opacification of the left maxillary sinus by partially calcified material with associated osteitis consistent with chronic sinusitis. Clear mastoid air cells. Bilateral cataract extraction. Other: None. IMPRESSION: 1. No evidence of acute intracranial abnormality. 2. Mild chronic  small vessel ischemic disease. Electronically Signed   By: Sebastian Ache M.D.   On: 11/01/2023 20:08    Pending Labs Unresulted Labs (From admission, onward)     Start     Ordered   11/03/23 0500  Basic metabolic panel  Tomorrow morning,   R        11/02/23 0818   11/03/23 0500  CBC  Tomorrow morning,   R        11/02/23 0818   11/03/23 0500  Lipid panel  Tomorrow morning,   R        11/02/23 0818   11/03/23 0500  TSH  Tomorrow morning,   R        11/02/23 0819   11/02/23 1003  Urinalysis, Routine w reflex microscopic -Urine, Clean Catch  Once,   R       Question:  Specimen Source  Answer:  Urine, Clean Catch   11/02/23 1002            Vitals/Pain Today's Vitals   11/02/23 1126 11/02/23 1402 11/02/23 1452 11/02/23 1500  BP:  (!) 153/79 122/78 113/72  Pulse:  62 88 88  Resp:  16 15 19   Temp: 98.3 F (36.8 C)     TempSrc: Oral     SpO2:  95% 96% 94%  PainSc:        Isolation Precautions No active isolations  Medications Medications  sodium  chloride flush (NS) 0.9 % injection 3 mL (has no administration in time range)  insulin aspart (novoLOG) injection 0-9 Units (1 Units Subcutaneous Given 11/02/23 1215)  insulin aspart (novoLOG) injection 0-5 Units (has no administration in time range)  acetaminophen (TYLENOL) tablet 650 mg (has no administration in time range)    Or  acetaminophen (TYLENOL) suppository 650 mg (has no administration in time range)  albuterol (PROVENTIL) (2.5 MG/3ML) 0.083% nebulizer solution 2.5 mg (has no administration in time range)  hydrALAZINE (APRESOLINE) injection 10 mg (has no administration in time range)  perflutren lipid microspheres (DEFINITY) IV suspension (2 mLs Intravenous Given 11/02/23 1123)  ezetimibe (ZETIA) tablet 10 mg (10 mg Oral Given 11/02/23 1212)  metoprolol tartrate (LOPRESSOR) tablet 12.5 mg (12.5 mg Oral Given 11/02/23 1211)  rosuvastatin (CRESTOR) tablet 40 mg (40 mg Oral Given 11/02/23 1212)  apixaban (ELIQUIS) tablet  5 mg (has no administration in time range)    Mobility walks with device     Focused Assessments Neuro Assessment Handoff:  Swallow screen pass? Yes  Cardiac Rhythm: Atrial fibrillation NIH Stroke Scale  Dizziness Present: No Headache Present: No Interval: Shift assessment Level of Consciousness (1a.)   : Alert, keenly responsive LOC Questions (1b. )   : Answers both questions correctly LOC Commands (1c. )   : Performs both tasks correctly Best Gaze (2. )  : Normal Visual (3. )  : No visual loss Facial Palsy (4. )    : Normal symmetrical movements Motor Arm, Left (5a. )   : No drift Motor Arm, Right (5b. ) : No drift Motor Leg, Left (6a. )  : No drift Motor Leg, Right (6b. ) : No drift Limb Ataxia (7. ): Absent Sensory (8. )  : Normal, no sensory loss Best Language (9. )  : No aphasia Dysarthria (10. ): Normal Extinction/Inattention (11.)   : No Abnormality Complete NIHSS TOTAL: 0     Neuro Assessment: Exceptions to WDL Neuro Checks:   Initial (11/02/23 0533)  Has TPA been given? No If patient is a Neuro Trauma and patient is going to OR before floor call report to 4N Charge nurse: [phone] or [phone]   R Recommendations: See Admitting Provider Note  Report given to:   Additional Notes: pt is AAOx4. Pt is on room air. Pt is using urinal.

## 2023-11-02 NOTE — Evaluation (Addendum)
Physical Therapy Evaluation Patient Details Name: TIAM NELLER MRN: [id number] DOB: February 26, 1936 Today's Date: 11/02/2023  History of Present Illness  87 y.o. male with PMH significant for DM2, HTN, HLD, OSA on CPAP, CAD s/p CABG 2007, PAD s/p left leg stenting, osteoarthritis  11/11, patient presented to the ED with complaint of weakness, dizziness for few days.  MR Brain:small acute infarct in the right paramedian pons.  Clinical Impression  PTA, pt lives at home with his spouse and has been recently using a RW. Endorses history of 4-5 falls within past month. Pt presents with mild proximal LLE weakness and impaired balance. Pt ambulating 100 ft with a walker at a CGA level. Reviewed BEFAST stroke signs and symptoms. Recommend follow up HHPT to address deficits and maximize functional mobility.       If plan is discharge home, recommend the following: A little help with walking and/or transfers;A little help with bathing/dressing/bathroom;Assistance with cooking/housework;Assist for transportation;Help with stairs or ramp for entrance   Can travel by private vehicle        Equipment Recommendations None recommended by PT  Recommendations for Other Services       Functional Status Assessment Patient has had a recent decline in their functional status and demonstrates the ability to make significant improvements in function in a reasonable and predictable amount of time.     Precautions / Restrictions Precautions Precautions: Fall Restrictions Weight Bearing Restrictions: No      Mobility  Bed Mobility               General bed mobility comments: OOB in chair    Transfers Overall transfer level: Needs assistance Equipment used: Rollator (4 wheels) Transfers: Sit to/from Stand Sit to Stand: Supervision                Ambulation/Gait Ambulation/Gait assistance: Contact guard assist Gait Distance (Feet): 100 Feet Assistive device: Rollator (4 wheels) Gait  Pattern/deviations: Step-through pattern, Decreased stride length, Trunk flexed       General Gait Details: CGA for safety, mild unsteadiness  Stairs            Wheelchair Mobility     Tilt Bed    Modified Rankin (Stroke Patients Only) Modified Rankin (Stroke Patients Only) Pre-Morbid Rankin Score: Slight disability Modified Rankin: Moderately severe disability     Balance Overall balance assessment: Needs assistance Sitting-balance support: Feet supported Sitting balance-Leahy Scale: Good     Standing balance support: Single extremity supported, Bilateral upper extremity supported Standing balance-Leahy Scale: Poor                               Pertinent Vitals/Pain Pain Assessment Pain Assessment: No/denies pain    Home Living Family/patient expects to be discharged to:: Private residence Living Arrangements: Spouse/significant other Available Help at Discharge: Family;Available 24 hours/day Type of Home: House Home Access: Stairs to enter Entrance Stairs-Rails: Right Entrance Stairs-Number of Steps: 4   Home Layout: One level Home Equipment: Agricultural consultant (2 wheels);Cane - single point;Tub bench;Hand held shower head Additional Comments: walking stick, recently using RW. enjoys fishing    Prior Function Prior Level of Function : Independent/Modified Independent;Driving             Mobility Comments: Uses SPC in the home, last few days has been using the RW ADLs Comments: Wife supervises transfer into the shower, able to bathe and dress himself.  Spouse completes iADL  Extremity/Trunk Assessment   Upper Extremity Assessment Upper Extremity Assessment: Overall WFL for tasks assessed    Lower Extremity Assessment Lower Extremity Assessment: RLE deficits/detail;LLE deficits/detail RLE Deficits / Details: Strength grossly 5/5 LLE Deficits / Details: Strength grossly 5/5 except hip flexion 4/5    Cervical / Trunk  Assessment Cervical / Trunk Assessment: Kyphotic  Communication   Communication Communication: No apparent difficulties  Cognition Arousal: Alert Behavior During Therapy: WFL for tasks assessed/performed Overall Cognitive Status: Within Functional Limits for tasks assessed                                          General Comments      Exercises     Assessment/Plan    PT Assessment Patient needs continued PT services  PT Problem List Decreased strength;Decreased activity tolerance;Decreased balance;Decreased mobility       PT Treatment Interventions DME instruction;Gait training;Stair training;Functional mobility training;Therapeutic activities;Therapeutic exercise;Balance training;Patient/family education    PT Goals (Current goals can be found in the Care Plan section)  Acute Rehab PT Goals Patient Stated Goal: return to prior function PT Goal Formulation: With patient Time For Goal Achievement: 11/16/23 Potential to Achieve Goals: Good    Frequency Min 1X/week     Co-evaluation               AM-PAC PT "6 Clicks" Mobility  Outcome Measure Help needed turning from your back to your side while in a flat bed without using bedrails?: None Help needed moving from lying on your back to sitting on the side of a flat bed without using bedrails?: A Little Help needed moving to and from a bed to a chair (including a wheelchair)?: A Little Help needed standing up from a chair using your arms (e.g., wheelchair or bedside chair)?: A Little Help needed to walk in hospital room?: A Little Help needed climbing 3-5 steps with a railing? : A Little 6 Click Score: 19    End of Session Equipment Utilized During Treatment: Gait belt Activity Tolerance: Patient tolerated treatment well Patient left: in chair;with call bell/phone within reach Nurse Communication: Mobility status PT Visit Diagnosis: Unsteadiness on feet (R26.81);History of falling (Z91.81)     Time: 1313-1330 PT Time Calculation (min) (ACUTE ONLY): 17 min   Charges:   PT Evaluation $PT Eval Low Complexity: 1 Low   PT General Charges $$ ACUTE PT VISIT: 1 Visit         Lillia Pauls, PT, DPT Acute Rehabilitation Services Office [phone]   Norval Morton 11/02/2023, 2:50 PM

## 2023-11-02 NOTE — H&P (Signed)
[facility] History and Physical  Rick Melendez EXB:[id number] DOB: 1936/05/21 DOA: 11/01/2023 PCP: Merri Brunette, MD  Presented from: Home Chief Complaint: Weakness, dizziness  History of Present Illness: Rick Melendez is a 87 y.o. male with PMH significant for DM2, HTN, HLD, OSA on CPAP, CAD s/p CABG 2007, PAD s/p left leg stenting, osteoarthritis 11/11, patient presented to the ED with complaint of weakness, dizziness for few days.  Patient started feeling dizzy upon waking up Saturday morning 11/9.  He fell and hit his abdomen on a dresser on the way down.  He thought it might have been because of vertigo and took meclizine that his wife takes for vertigo.  He slept all day and was weaker when he woke up only able to use a walker.  Weakness did not improve in next few days, went to see his PCP and subsequently sent to ED.  In the ED, patient was afebrile, initial heart rate 47, blood pressure 146/84, breathing on room air Labs with CBC and CMP mostly unremarkable Blood alcohol level not elevated Initial CT head did not show any acute intracranial abnormality.  It showed mild chronic small vessel ischemic disease. MRI brain showed Small acute infarct in the right paramedian pons.  EKG with A-fib at 72 bpm Neurology was consulted. Hospital service was consulted for inpatient management.  At the time of my evaluation, patient was lying down in bed.  Not in distress. No dysarthria and left-sided weakness, left lower extremity more than upper.  Review of Systems:  All systems were reviewed and were negative unless otherwise mentioned in the HPI   Past medical history: Past Medical History:  Diagnosis Date   Arthritis    Coronary artery disease 2007   cabg x3   Diabetes mellitus without complication (HCC)    Hyperlipidemia    Hypertension    Influenza B 03/19/2017   OSA on CPAP    Dr Tresa Endo - follows and treatmentof sleep apnea   PVD (peripheral vascular disease) (HCC)  2003   DR BERRY-  -LEFT LEG STENTING    Past surgical history: Past Surgical History:  Procedure Laterality Date   CARDIAC CATHETERIZATION  0911/2007   3 vessel CAD WITH LEFT MAIN CAD SEVERE ,norm lLIMA,LV nom. ,evidence for very mild aortic stenosis with gradient, mild stenosis distal abdnminal aotra and proximal left common iliac artery 30%   CORONARY ARTERY BYPASS GRAFT  08/31/2006   DR GERHARDT---LIMA to LAD, VEIN TO AN OBTUSE MARGINAL BRANCH AND  RIGHT CORONARY ARTERY   CPET  04/13/2012   normal   DOPPLER ECHOCARDIOGRAPHY  07/08/2011   EF =>55%   lower arterial doppler  07/01/2012   mildly abnormal lower extremity;ABIs >1 bilaterally with moderately high-grade right iliac stenosis that had not changed from proir study.   NM MYOCAR PERF WALL MOTION  7/182012   EF 59%, normal myocardial perfusio    Social History:  reports that he quit smoking about 42 years ago. His smoking use included cigarettes. His smokeless tobacco use includes chew. He reports current alcohol use. He reports that he does not use drugs.  Allergies:  Allergies  Allergen Reactions   Ozempic (0.25 Or 0.5 Mg-Dose) [Semaglutide(0.25 Or 0.5mg -Dos)] Other (See Comments)    Hallucinations, lethargic    Ozempic (0.25 or 0.5 mg-dose) [semaglutide(0.25 or 0.5mg -dos)]   Family history:  Family History  Problem Relation Age of Onset   Cancer Father    Cancer Brother      Physical Exam: Vitals:  11/02/23 0714 11/02/23 0958 11/02/23 1126 11/02/23 1126  BP:   (!) 140/91   Pulse:   99   Resp:   20   Temp: 98.7 F (37.1 C) 98.6 F (37 C)  98.3 F (36.8 C)  TempSrc: Oral Oral  Oral  SpO2:   93%    Wt Readings from Last 3 Encounters:  08/11/23 91.6 kg  07/16/23 90.3 kg  05/05/23 89.8 kg   There is no height or weight on file to calculate BMI.  General exam: [name].  Lying down in bed.  Not in pain Skin: No rashes, lesions or ulcers. HEENT: Atraumatic, normocephalic, no obvious  bleeding Lungs: Clear to auscultation bilaterally CVS: Regular rate and rhythm, no murmur GI/Abd soft, nontender, nondistended, bowel sound present CNS: Alert, awake, oriented x 3.  Dysarthria present.  Left lower extremity strength 4/5.  Left hand grip 4/5. Psychiatry: Mood appropriate Extremities: No pedal edema, no calf tenderness   ------------------------------------------------------------------------------------------------------ Assessment/Plan: Principal Problem:   Acute stroke due to ischemia (HCC)  Acute stroke Symptoms seems to have started 4 days ago on 11/9 Now has persistent left-sided weakness, dysarthria MRI brain with small acute infarct in the right paramedian pons EKG with new onset A-fib, probable cause stroke. Stroke workup started. Order for A1c, lipid panel, TSH, carotid duplex, echocardiogram PT/OT/ST eval PTA meds-aspirin 81 mg daily, Plavix 75 mg daily, Crestor 40 mg daily Currently Sinemet been resumed.  New onset A-fib No prior history of A-fib.   EKG on admission showed A-fib/flutter at a rate of 72 bpm PTA meds-metoprolol 12.5 mg daily.  Since A-fib is rate controlled, will continue the same dose of metoprolol. Needs anticoagulation.  Avoiding now to prevent hemorrhagic transformation of stroke.  Will discuss with neurology  CAD s/p CABG 2007 PAD s/p left leg stenting HLD PTA meds- aspirin, Plavix, statin, metoprolol Anticoagulation plan after discussed with neurology.  Continue metoprolol and statin  HTN PTA meds- metoprolol, Lasix, finerenone Resume metoprolol.  Keep others on hold to allow permissive hypertension  Type 2 diabetes mellitus A1c 6.9 on 11/01/2023 PTA meds-Jardiance Start SSI/Accu-Cheks Recent Labs  Lab 11/02/23 0404  GLUCAP 145*   OSA on CPAP  Osteoarthritis   Mobility: PT/OT eval  Goals of care   Code Status: Full Code    DVT prophylaxis:  enoxaparin (LOVENOX) injection 40 mg Start: 11/02/23 1000    Antimicrobials: None Fluid: none Consultants: Neurology Family Communication: None at bedside  Dispo: The patient is from: Home              Anticipated d/c is to: Home, pending clinical course  Diet: Diet Order             Diet heart healthy/carb modified Room service appropriate? Yes; Fluid consistency: Thin  Diet effective now                    ------------------------------------------------------------------------------------- Severity of Illness: The appropriate patient status for this patient is OBSERVATION. Observation status is judged to be reasonable and necessary in order to provide the required intensity of service to ensure the patient's safety. The patient's presenting symptoms, physical exam findings, and initial radiographic and laboratory data in the context of their medical condition is felt to place them at decreased risk for further clinical deterioration. Furthermore, it is anticipated that the patient will be medically stable for discharge from the hospital within 2 midnights of admission.  -------------------------------------------------------------------------------------  Home Meds: Prior to Admission medications  Medication Sig Start Date End Date Taking? Authorizing Provider  albuterol (PROVENTIL HFA) 108 (90 Base) MCG/ACT inhaler Inhale 2 puffs into the lungs every 4 (four) hours as needed for wheezing or shortness of breath. 07/07/23  Yes [provider]  aspirin EC 81 MG tablet Take 81 mg by mouth daily.   Yes [provider]  clopidogrel (PLAVIX) 75 MG tablet Take 75 mg by mouth daily.   Yes [provider]  Cyanocobalamin (B-12 PO) Take 1 tablet by mouth daily.   Yes [provider]  ezetimibe (ZETIA) 10 MG tablet Take 10 mg by mouth daily.   Yes [provider]  famotidine (PEPCID) 20 MG tablet Take 20 mg by mouth 2 (two) times daily. 05/18/23  Yes [provider]  FARXIGA 10 MG TABS tablet  Take 10 mg by mouth daily. 03/21/22  Yes [provider]  furosemide (LASIX) 20 MG tablet Take 20 mg by mouth daily. 04/29/17  Yes [provider]  ipratropium (ATROVENT) 0.06 % nasal spray Place 1 spray into the nose 3 (three) times daily as needed for rhinitis. 06/03/23  Yes [provider]  KERENDIA 10 MG TABS Take 10 mg by mouth daily. 03/18/23  Yes [provider]  meclizine (ANTIVERT) 25 MG tablet Take 25 mg by mouth 3 (three) times daily as needed for dizziness.   Yes [provider]  memantine (NAMENDA) 5 MG tablet Take 1 tablet  twice a day 07/16/23  Yes Wertman, Sung Amabile, PA-C  metoprolol tartrate (LOPRESSOR) 25 MG tablet Take 12.5 mg by mouth daily.    Yes [provider]  rosuvastatin (CRESTOR) 40 MG tablet Take 40 mg by mouth daily. 06/03/23  Yes [provider]  traMADol (ULTRAM) 50 MG tablet Take 50-100 mg by mouth See admin instructions. Take 50mg  (1 tablet) by mouth two to three times a day. 09/25/21  Yes [provider]  doxycycline (VIBRAMYCIN) 100 MG capsule Take 100 mg by mouth 2 (two) times daily. Patient not taking: Reported on 11/02/2023 10/05/23   [provider]  OVER THE COUNTER MEDICATION USES CPAP NIGHTLY    [provider]    Labs on Admission:   CBC: Recent Labs  Lab 11/01/23 1634  WBC 9.7  NEUTROABS 5.9  HGB 15.3  HCT 51.2  MCV 83.9  PLT 253    Basic Metabolic Panel: Recent Labs  Lab 11/01/23 1634  NA 139  K 4.1  CL 104  CO2 23  GLUCOSE 123*  BUN 25*  CREATININE 1.23  CALCIUM 9.1    Liver Function Tests: Recent Labs  Lab 11/01/23 1634  AST 26  ALT 20  ALKPHOS 115  BILITOT 0.6  PROT 6.6  ALBUMIN 3.3*   No results for input(s): "LIPASE", "AMYLASE" in the last 168 hours. No results for input(s): "AMMONIA" in the last 168 hours.  Cardiac Enzymes: No results for input(s): "CKTOTAL", "CKMB", "CKMBINDEX", "TROPONINI" in the last 168 hours.  BNP (last 3  results) No results for input(s): "BNP" in the last 8760 hours.  ProBNP (last 3 results) No results for input(s): "PROBNP" in the last 8760 hours.  CBG: Recent Labs  Lab 11/02/23 0404  GLUCAP 145*    Lipase  No results found for: "LIPASE"   Urinalysis No results found for: "COLORURINE", "APPEARANCEUR", "LABSPEC", "PHURINE", "GLUCOSEU", "HGBUR", "BILIRUBINUR", "KETONESUR", "PROTEINUR", "UROBILINOGEN", "NITRITE", "LEUKOCYTESUR"   Drugs of Abuse  No results found for: "LABOPIA", "COCAINSCRNUR", "LABBENZ", "AMPHETMU", "THCU", "LABBARB"    Radiological Exams  on Admission: MR BRAIN WO CONTRAST  Result Date: 11/02/2023 CLINICAL DATA:  Weakness and dizziness that started on Saturday. EXAM: MRI HEAD WITHOUT CONTRAST TECHNIQUE: Multiplanar, multiecho pulse sequences of the brain and surrounding structures were obtained without intravenous contrast. COMPARISON:  Head CT from yesterday FINDINGS: Brain: Small acute infarct in the right paramedian pons. Chronic small vessel ischemic gliosis in the periventricular white matter and to a lesser extent in the pons. Mild for age cerebral volume loss which is generalized. No acute hemorrhage, hydrocephalus, or masslike finding. Vascular: Major flow voids are preserved Skull and upper cervical spine: Normal marrow signal Sinuses/Orbits: Mucosal thickening and debris in the left maxillary sinus where there is atelectasis, chronic findings. Bilateral cataract resection. IMPRESSION: 1. Small acute infarct in the right paramedian pons. 2. Aging brain. Electronically Signed   By: Tiburcio Pea M.D.   On: 11/02/2023 07:53   DG Chest Portable 1 View  Result Date: 11/02/2023 CLINICAL DATA:  Tachypnea and relative hypoxia. EXAM: PORTABLE CHEST 1 VIEW COMPARISON:  03/19/2017 FINDINGS: Chronic interstitial coarsening. Chronic cardiomegaly. There has been prior CABG. No edema, effusion, or pneumothorax. Artifact from EKG leads. IMPRESSION: No acute finding when  compared to 2018. Cardiomegaly. Electronically Signed   By: Tiburcio Pea M.D.   On: 11/02/2023 07:49   CT HEAD WO CONTRAST  Result Date: 11/01/2023 CLINICAL DATA:  Transient ischemic attack (TIA). Dizziness and weakness. EXAM: CT HEAD WITHOUT CONTRAST TECHNIQUE: Contiguous axial images were obtained from the base of the skull through the vertex without intravenous contrast. RADIATION DOSE REDUCTION: This exam was performed according to the departmental dose-optimization program which includes automated exposure control, adjustment of the mA and/or kV according to patient size and/or use of iterative reconstruction technique. COMPARISON:  None Available. FINDINGS: Brain: There is no evidence of an acute infarct, intracranial hemorrhage, mass, midline shift, or extra-axial fluid collection. Cerebral white matter hypodensities are nonspecific but compatible with mild chronic small vessel ischemic disease. There is a chronic right basal ganglia lacunar infarct. Mild cerebral atrophy is within normal limits for age. Vascular: Calcified atherosclerosis at the skull base. No hyperdense vessel. Skull: No acute fracture or suspicious osseous lesion. Sinuses/Orbits: Subtotal opacification of the left maxillary sinus by partially calcified material with associated osteitis consistent with chronic sinusitis. Clear mastoid air cells. Bilateral cataract extraction. Other: None. IMPRESSION: 1. No evidence of acute intracranial abnormality. 2. Mild chronic small vessel ischemic disease. Electronically Signed   By: Sebastian Ache M.D.   On: 11/01/2023 20:08     Signed, Lorin Glass, MD [facility] 11/02/2023

## 2023-11-02 NOTE — ED Provider Notes (Signed)
[place] EMERGENCY DEPARTMENT AT [facility] Provider Note   CSN: [id number] Arrival date & time: 11/01/23  1619     History  Chief Complaint  Patient presents with   Dizziness   Weakness    Rick Melendez is a 87 y.o. male.  46-year-old male that presents the ER today secondary to weakness and dizziness.  Patient states that he was in normal state of health Friday night and then again Saturday morning when he first woke up.  He stood up with good strength and did well within he states he felt dizzy and weak and fell hitting his upper abdomen on dresser and then to the floor.  States he could not get up so he crawled over to a certain spot where he can pull himself up.  He did not feel weak on one side necessarily but stated he just felt off balance.  Wife has a history of vertigo and thought maybe his balance was related to that so gave a meclizine.  The patient slept through most of the day when he woke up he could walk but only with a walker as he kept falling to the left and forward.  This persisted throughout the weekend.  He saw his doctor who sent him here for further evaluation.  Patient states that seems to be more off balance.  Noted right leg weakness he stated that is not his baseline.  Has some chronic knee problems but this seems to be worse on the left.   Dizziness Associated symptoms: weakness   Weakness Associated symptoms: dizziness        Home Medications Prior to Admission medications   Medication Sig Start Date End Date Taking? Authorizing Provider  aspirin EC 81 MG tablet Take 81 mg by mouth daily.   Yes [provider]  clopidogrel (PLAVIX) 75 MG tablet Take 75 mg by mouth daily.   Yes [provider]  Cyanocobalamin (B-12 PO) Take 1 tablet by mouth daily.   Yes [provider]  doxycycline (VIBRAMYCIN) 100 MG capsule Take 100 mg by mouth 2 (two) times daily. 10/05/23  Yes [provider]  ezetimibe (ZETIA) 10  MG tablet Take 10 mg by mouth daily.   Yes [provider]  famotidine (PEPCID) 20 MG tablet Take 20 mg by mouth 2 (two) times daily. 05/18/23  Yes [provider]  FARXIGA 10 MG TABS tablet Take 10 mg by mouth daily. 03/21/22  Yes [provider]  furosemide (LASIX) 20 MG tablet Take 1 tablet by mouth daily. 04/29/17  Yes [provider]  ipratropium (ATROVENT) 0.06 % nasal spray Place 1 spray into the nose 3 (three) times daily as needed for rhinitis. 06/03/23  Yes [provider]  KERENDIA 10 MG TABS Take 10 mg by mouth daily. 03/18/23  Yes [provider]  memantine (NAMENDA) 5 MG tablet Take 1 tablet  twice a day 07/16/23  Yes Wertman, Sung Amabile, PA-C  metoprolol tartrate (LOPRESSOR) 25 MG tablet Take 12.5 mg by mouth daily.    Yes [provider]  rosuvastatin (CRESTOR) 40 MG tablet Take 40 mg by mouth daily. 06/03/23  Yes [provider]  traMADol (ULTRAM) 50 MG tablet Take 50-100 mg by mouth See admin instructions. Take 50mg  (1 tablet) by mouth two to three times a day. 09/25/21  Yes [provider]  albuterol (PROVENTIL HFA) 108 (90 Base) MCG/ACT inhaler Inhale 2 puffs into the lungs 4 (four) times daily. Until your  cough is extingiushed for 30 days 07/07/23   [provider]  Coenzyme Q10 (COQ10) 200 MG CAPS Take 200 mg by mouth daily. Patient not taking: Reported on 05/05/2023 09/07/13   Abelino Derrick, PA-C  gabapentin (NEURONTIN) 300 MG capsule Take 1 capsule by mouth at night for 7 nights and then 1 in the morning and one at night for 7 days and then 3 times a day. 01/13/21   Tyrell Antonio, MD  glipiZIDE (GLUCOTROL) 5 MG tablet Take 5 mg by mouth daily.    [provider]  OVER THE COUNTER MEDICATION USES CPAP NIGHTLY    [provider]      Allergies    Ozempic (0.25 or 0.5 mg-dose) [semaglutide(0.25 or 0.5mg -dos)]    Review of Systems   Review of Systems  Neurological:  Positive for  dizziness and weakness.    Physical Exam Updated Vital Signs BP 117/71   Pulse 94   Temp 98.7 F (37.1 C) (Oral)   Resp (!) 26   SpO2 93%  Physical Exam Vitals and nursing note reviewed.  Constitutional:      Appearance: He is well-developed.  HENT:     Head: Normocephalic and atraumatic.  Cardiovascular:     Rate and Rhythm: Normal rate. Rhythm irregular.  Pulmonary:     Effort: Pulmonary effort is normal. No respiratory distress.  Abdominal:     General: There is no distension.  Musculoskeletal:        General: Normal range of motion.     Cervical back: Normal range of motion.  Neurological:     Mental Status: He is alert.     Comments: Cranial nerves seem to be grossly intact as tested.  Good eyebrow raise, slightly asymmetric pupils, good extraocular movements, good constriction dilation pupils, tongue midline, uvula midline, smile is symmetric.  Upper extremity strength and sensation seem to be symmetric.  Finger-nose was slow but no overshooting.  While lying in the bed his plantarflexion of right foot was diminished compared to his left but dorsiflexion was normal.  He was able to sit on the edge of the bed stand up and walk with a walker with a slight shuffling gait but overall likely his baseline however he does not usually use a walker.  Without the walker after a few steps he would consistently fall forward.  No foot drop.  No obvious left or right ataxia.  Sensation to light touch in all 4 extremities was symmetric.     ED Results / Procedures / Treatments   Labs (all labs ordered are listed, but only abnormal results are displayed) Labs Reviewed  CBC - Abnormal; Notable for the following components:      Result Value   RBC 6.10 (*)    MCH 25.1 (*)    MCHC 29.9 (*)    RDW 16.1 (*)    All other components within normal limits  COMPREHENSIVE METABOLIC PANEL - Abnormal; Notable for the following components:   Glucose, Bld 123 (*)    BUN 25 (*)    Albumin 3.3 (*)     GFR, Estimated 58 (*)    All other components within normal limits  CBG MONITORING, ED - Abnormal; Notable for the following components:   Glucose-Capillary 145 (*)    All other components within normal limits  PROTIME-INR  APTT  DIFFERENTIAL  ETHANOL    EKG EKG Interpretation Date/Time:  Tuesday November 02 2023 06:23:31 EST Ventricular Rate:  87 PR Interval:  QRS Duration:  114 QT Interval:  368 QTC Calculation: 443 R Axis:   -15  Text Interpretation: Atrial fibrillation Ventricular premature complex Incomplete left bundle branch block Confirmed by Virgina Norfolk [phone]) on 11/02/2023 7:07:02 AM  Radiology CT HEAD WO CONTRAST  Result Date: 11/01/2023 CLINICAL DATA:  Transient ischemic attack (TIA). Dizziness and weakness. EXAM: CT HEAD WITHOUT CONTRAST TECHNIQUE: Contiguous axial images were obtained from the base of the skull through the vertex without intravenous contrast. RADIATION DOSE REDUCTION: This exam was performed according to the departmental dose-optimization program which includes automated exposure control, adjustment of the mA and/or kV according to patient size and/or use of iterative reconstruction technique. COMPARISON:  None Available. FINDINGS: Brain: There is no evidence of an acute infarct, intracranial hemorrhage, mass, midline shift, or extra-axial fluid collection. Cerebral white matter hypodensities are nonspecific but compatible with mild chronic small vessel ischemic disease. There is a chronic right basal ganglia lacunar infarct. Mild cerebral atrophy is within normal limits for age. Vascular: Calcified atherosclerosis at the skull base. No hyperdense vessel. Skull: No acute fracture or suspicious osseous lesion. Sinuses/Orbits: Subtotal opacification of the left maxillary sinus by partially calcified material with associated osteitis consistent with chronic sinusitis. Clear mastoid air cells. Bilateral cataract extraction. Other: None. IMPRESSION: 1.  No evidence of acute intracranial abnormality. 2. Mild chronic small vessel ischemic disease. Electronically Signed   By: Sebastian Ache M.D.   On: 11/01/2023 20:08    Procedures Procedures    Medications Ordered in ED Medications  sodium chloride flush (NS) 0.9 % injection 3 mL (has no administration in time range)    ED Course/ Medical Decision Making/ A&P                                 Medical Decision Making Amount and/or Complexity of Data Reviewed Labs: ordered. Radiology: ordered. ECG/medicine tests: ordered.   Patient does not have a history of A-fib and is not on anticoagulation.  This with a sudden onset of what seems like a balance issue and possible right leg weakness is concerning for CVA.  CT scan was negative so we will need an MRI for that.  Patient has a past history of smoking does not currently smoke is never been diagnosed with COPD.  His respiratory rate was in the mid 20s and his oxygen was a little bit low.  Not requiring oxygen however with the new A-fib we will get a chest x-ray to ensure no pulmonary edema.  Care transferred pending MRI, reevaluation and possible neurologic consultation/admission versus outpatient follow-up.        Final Clinical Impression(s) / ED Diagnoses Final diagnoses:  None    Rx / DC Orders ED Discharge Orders     None         Nicolas Banh, Barbara Cower, MD 11/02/23 [phone]

## 2023-11-02 NOTE — ED Notes (Signed)
Patient transported to MRI 

## 2023-11-02 NOTE — Progress Notes (Signed)
SLP Cancellation Note  Patient Details Name: DEMECO YAGI MRN: [id number] DOB: 26-Apr-1936   Cancelled treatment:       Reason Eval/Treat Not Completed: SLP screened. Pt passed [facility] swallow screen and per RN, is not having difficulty on regular texture diet. Will sign off for swallowing and f/u as able to complete speech language evaluation.   Gwynneth Aliment, M.A., CF-SLP Speech Language Pathology, Acute Rehabilitation Services  Secure Chat preferred [phone]  11/02/2023, 11:19 AM

## 2023-11-02 NOTE — ED Provider Notes (Signed)
Patient handed off to me with concern for stroke awaiting MRI.  Has new A-fib but rate controlled.  He is already on aspirin and Plavix for his CAD history.  MRI is positive for stroke.  Patient has small acute infarct in the right paramedian pons.  he has had dizziness and imbalance symptoms since about 26 hours ago.  Otherwise no obvious major symptoms.  Patient to be admitted to medicine.  I have messaged Dr. Otelia Limes with neurology team to make them aware as well.  This chart was dictated using voice recognition software.  Despite best efforts to proofread,  errors can occur which can change the documentation meaning.    Virgina Norfolk, DO 11/02/23 [phone]

## 2023-11-02 NOTE — Evaluation (Signed)
Occupational Therapy Evaluation Patient Details Name: Rick Melendez MRN: [id number] DOB: 09-Oct-1936 Today's Date: 11/02/2023   History of Present Illness 87 y.o. male with PMH significant for DM2, HTN, HLD, OSA on CPAP, CAD s/p CABG 2007, PAD s/p left leg stenting, osteoarthritis  11/11, patient presented to the ED with complaint of weakness, dizziness for few days.  MR Brain:small acute infarct in the right paramedian pons.   Clinical Impression   Patient admitted for the diagnosis above.  PTA he lives at home with his spouse, who is able to provide needed assist.  Patient does admit to some baseline unsteadiness, but believes he is more unsteady, particularly his balance going forward and backward.  He is currently relying heavily on objects in his environment.  With the stretcher behind him, he is able to stand and perform pant management, but without something to lean on he balance is poor.  From a seated position, he is able to complete UB ADL and grooming tasks.  OT did not have access to a RW, but with HHA of one, he is Min A for basic mobility walking to the bathroom.  OT will follow in the acute setting to address deficits, and Patient will benefit from continued inpatient follow up therapy, <3 hours/day.  PT Consult is pending, patient is not necessarily interested in SNF, will await PT recommendations.            If plan is discharge home, recommend the following: A little help with walking and/or transfers;A little help with bathing/dressing/bathroom;Assist for transportation;Assistance with cooking/housework    Functional Status Assessment  Patient has had a recent decline in their functional status and demonstrates the ability to make significant improvements in function in a reasonable and predictable amount of time.  Equipment Recommendations  None recommended by OT    Recommendations for Other Services       Precautions / Restrictions Precautions Precautions:  Fall Restrictions Weight Bearing Restrictions: No      Mobility Bed Mobility Overal bed mobility: Needs Assistance Bed Mobility: Supine to Sit     Supine to sit: Supervision          Transfers Overall transfer level: Needs assistance Equipment used: Rolling walker (2 wheels) Transfers: Sit to/from Stand, Bed to chair/wheelchair/BSC Sit to Stand: Contact guard assist, Min assist     Step pivot transfers: Min assist            Balance Overall balance assessment: Needs assistance Sitting-balance support: Feet supported Sitting balance-Leahy Scale: Good     Standing balance support: Single extremity supported, Bilateral upper extremity supported Standing balance-Leahy Scale: Poor                             ADL either performed or assessed with clinical judgement   ADL       Grooming: Wash/dry hands;Wash/dry face;Supervision/safety;Sitting           Upper Body Dressing : Set up;Sitting   Lower Body Dressing: Minimal assistance;Sit to/from stand   Toilet Transfer: Minimal Holiday representative;Ambulation                   Vision Patient Visual Report: No change from baseline       Perception Perception: Within Functional Limits       Praxis Praxis: [facility]       Pertinent Vitals/Pain Pain Assessment Pain Assessment: No/denies pain     Extremity/Trunk Assessment Upper Extremity Assessment  Upper Extremity Assessment: Overall WFL for tasks assessed   Lower Extremity Assessment Lower Extremity Assessment: Defer to PT evaluation   Cervical / Trunk Assessment Cervical / Trunk Assessment: Kyphotic   Communication Communication Communication: No apparent difficulties   Cognition Arousal: Alert Behavior During Therapy: WFL for tasks assessed/performed Overall Cognitive Status: Within Functional Limits for tasks assessed                                       General Comments   VSS on RA    Exercises      Shoulder Instructions      Home Living Family/patient expects to be discharged to:: Private residence Living Arrangements: Spouse/significant other Available Help at Discharge: Family;Available 24 hours/day Type of Home: House Home Access: Stairs to enter [employer] of Steps: 4 Entrance Stairs-Rails: Right Home Layout: One level     Bathroom Shower/Tub: Chief Strategy Officer: Standard Bathroom Accessibility: No   Home Equipment: Agricultural consultant (2 wheels);Cane - single point;Tub bench;Hand held shower head   Additional Comments: walking stick, 4 wheeler he uses for outside mobility.      Prior Functioning/Environment Prior Level of Function : Independent/Modified Independent;Driving             Mobility Comments: Uses SPC in the home, last few days has been using the RW ADLs Comments: Wife supervises transfer into the shower, able to bathe and dress himself.  Spouse completes iADL        OT Problem List: Decreased strength;Decreased activity tolerance;Impaired balance (sitting and/or standing);Decreased safety awareness      OT Treatment/Interventions: Self-care/ADL training;Therapeutic activities;DME and/or AE instruction;Balance training;Patient/family education    OT Goals(Current goals can be found in the care plan section) Acute Rehab OT Goals Patient Stated Goal: Hoping to return home OT Goal Formulation: With patient Time For Goal Achievement: 11/16/23 Potential to Achieve Goals: Good ADL Goals Pt Will Perform Grooming: with modified independence;standing Pt Will Perform Lower Body Dressing: with modified independence;sit to/from stand Pt Will Transfer to Toilet: with modified independence;ambulating;regular height toilet  OT Frequency: Min 1X/week    Co-evaluation              AM-PAC OT "6 Clicks" Daily Activity     Outcome Measure Help from another person eating meals?: None Help from another person taking care of  personal grooming?: None Help from another person toileting, which includes using toliet, bedpan, or urinal?: A Little Help from another person bathing (including washing, rinsing, drying)?: A Little Help from another person to put on and taking off regular upper body clothing?: None Help from another person to put on and taking off regular lower body clothing?: A Little 6 Click Score: 21   End of Session Equipment Utilized During Treatment: Gait belt Nurse Communication: Mobility status  Activity Tolerance: Patient tolerated treatment well Patient left: in chair;with call bell/phone within reach  OT Visit Diagnosis: Unsteadiness on feet (R26.81);Muscle weakness (generalized) (M62.81);History of falling (Z91.81)                Time: 1610-9604 OT Time Calculation (min): 23 min Charges:  OT General Charges $OT Visit: 1 Visit OT Evaluation $OT Eval Moderate Complexity: 1 Mod OT Treatments $Self Care/Home Management : 8-22 mins  11/02/2023  RP, OTR/L  Acute Rehabilitation Services  Office:  [phone]   Suzanna Obey 11/02/2023, 1:29 PM

## 2023-11-03 DIAGNOSIS — I639 Cerebral infarction, unspecified: Secondary | ICD-10-CM | POA: Diagnosis not present

## 2023-11-03 LAB — BASIC METABOLIC PANEL
Anion gap: 11 (ref 5–15)
BUN: 19 mg/dL (ref 8–23)
CO2: 24 mmol/L (ref 22–32)
Calcium: 9.2 mg/dL (ref 8.9–10.3)
Chloride: 101 mmol/L (ref 98–111)
Creatinine, Ser: 1.17 mg/dL (ref 0.61–1.24)
GFR, Estimated: >60 mL/min (ref >60)
Glucose, Bld: 124 mg/dL — ABNORMAL HIGH (ref 70–99)
Potassium: 3.8 mmol/L (ref 3.5–5.1)
Sodium: 136 mmol/L (ref 135–145)

## 2023-11-03 LAB — URINALYSIS, ROUTINE W REFLEX MICROSCOPIC
Bacteria, UA: NONE SEEN
Bilirubin Urine: NEGATIVE
Glucose, UA: >=500 mg/dL — AB
Ketones, ur: NEGATIVE mg/dL
Leukocytes,Ua: NEGATIVE
Nitrite: NEGATIVE
Protein, ur: 100 mg/dL — AB
Specific Gravity, Urine: 1.009 (ref 1.005–1.030)
pH: 6 (ref 5.0–8.0)

## 2023-11-03 LAB — RAPID URINE DRUG SCREEN, HOSP PERFORMED
Amphetamines: NOT DETECTED
Barbiturates: NOT DETECTED
Benzodiazepines: NOT DETECTED
Cocaine: NOT DETECTED
Opiates: NOT DETECTED
Tetrahydrocannabinol: NOT DETECTED

## 2023-11-03 LAB — GLUCOSE, CAPILLARY
Glucose-Capillary: 125 mg/dL — ABNORMAL HIGH (ref 70–99)
Glucose-Capillary: 143 mg/dL — ABNORMAL HIGH (ref 70–99)

## 2023-11-03 LAB — LIPID PANEL
Cholesterol: 89 mg/dL (ref 0–200)
HDL: 26 mg/dL — ABNORMAL LOW (ref >40)
LDL Cholesterol: 26 mg/dL (ref 0–99)
Total CHOL/HDL Ratio: 3.4 {ratio}
Triglycerides: 183 mg/dL — ABNORMAL HIGH (ref <150)
VLDL: 37 mg/dL (ref 0–40)

## 2023-11-03 LAB — CBC
HCT: 48.1 % (ref 39.0–52.0)
Hemoglobin: 14.7 g/dL (ref 13.0–17.0)
MCH: 25 pg — ABNORMAL LOW (ref 26.0–34.0)
MCHC: 30.6 g/dL (ref 30.0–36.0)
MCV: 81.9 fL (ref 80.0–100.0)
Platelets: 231 10*3/uL (ref 150–400)
RBC: 5.87 MIL/uL — ABNORMAL HIGH (ref 4.22–5.81)
RDW: 15.4 % (ref 11.5–15.5)
WBC: 11.5 10*3/uL — ABNORMAL HIGH (ref 4.0–10.5)
nRBC: 0 % (ref 0.0–0.2)

## 2023-11-03 LAB — TSH: TSH: 1.866 u[IU]/mL (ref 0.350–4.500)

## 2023-11-03 MED ORDER — STROKE: EARLY STAGES OF RECOVERY BOOK
Freq: Once | Status: AC
  Filled 2023-11-03: qty 1

## 2023-11-03 MED ORDER — APIXABAN 5 MG PO TABS: 5.0000 mg | ORAL_TABLET | Freq: Two times a day (BID) | ORAL | 0 refills | Status: DC

## 2023-11-03 NOTE — Care Management Obs Status (Signed)
MEDICARE OBSERVATION STATUS NOTIFICATION   Patient Details  Name: ASHELY PERSSON MRN: [id number] Date of Birth: [birth year]   Medicare Observation Status Notification Given:  Yes    Kermit Balo, RN 11/03/2023, 11:47 AM

## 2023-11-03 NOTE — Evaluation (Signed)
Speech Language Pathology Evaluation Patient Details Name: Rick Melendez MRN: [id number] DOB: [birth year] Today's Date: 11/03/2023 Time: 4098-1191 SLP Time Calculation (min) (ACUTE ONLY): 10 min  Problem List:  Patient Active Problem List   Diagnosis Date Noted   Acute stroke due to ischemia (HCC) 11/02/2023   DOE (dyspnea on exertion) 08/11/2023   Upper airway cough syndrome 08/11/2023   Slow heart rate 04/14/2022   Influenza B    AKI (acute kidney injury) (HCC)    CAP (community acquired pneumonia) 03/19/2017   Bilateral primary osteoarthritis of knee 01/28/2017   Impingement syndrome of right shoulder 01/28/2017   CAD - CABG X 2 9/07 LIMA-LAD, SVG-OM. Low risk Myoview 7/12 09/07/2013   Essential hypertension 09/07/2013   Dyslipidemia 09/07/2013   Diabetes mellitus (HCC) 09/07/2013   Chronic renal insufficiency, stage III (moderate)- SCr 1.6 (Aug 2013) 09/07/2013   Sleep apnea- compliant with C-pap 09/07/2013   PVD (peripheral vascular disease)- Rt iliac disease by doppler 7/13 09/07/2013   Past Medical History:  Past Medical History:  Diagnosis Date   Arthritis    Coronary artery disease 2007   cabg x3   Diabetes mellitus without complication (HCC)    Hyperlipidemia    Hypertension    Influenza B 03/19/2017   OSA on CPAP    Dr Tresa Endo - follows and treatmentof sleep apnea   PVD (peripheral vascular disease) (HCC) 2003   DR BERRY-  -LEFT LEG STENTING   Past Surgical History:  Past Surgical History:  Procedure Laterality Date   CARDIAC CATHETERIZATION  0911/2007   3 vessel CAD WITH LEFT MAIN CAD SEVERE ,norm lLIMA,LV nom. ,evidence for very mild aortic stenosis with gradient, mild stenosis distal abdnminal aotra and proximal left common iliac artery 30%   CORONARY ARTERY BYPASS GRAFT  08/31/2006   DR GERHARDT---LIMA to LAD, VEIN TO AN OBTUSE MARGINAL BRANCH AND  RIGHT CORONARY ARTERY   CPET  04/13/2012   normal   DOPPLER ECHOCARDIOGRAPHY  07/08/2011   EF =>55%   lower  arterial doppler  07/01/2012   mildly abnormal lower extremity;ABIs >1 bilaterally with moderately high-grade right iliac stenosis that had not changed from proir study.   NM MYOCAR PERF WALL MOTION  7/182012   EF 59%, normal myocardial perfusio   HPI:  Rick Melendez is an 87 yo male presenting to ED 11/11 with weakness and dizziness. MRI Brain with small acute infarct in the R paramedian pons. Pt and his wife report a history of memory decline. PMH includes T2DM, HTN, HLD, OSA on CPAP, CAD s/p CABG 2007, PAD s/p L leg stenting, osteoarthritis   Assessment / Plan / Recommendation Clinical Impression  Pt reports history of memory deficits, but states that he continues to independently manage medication administration. Pt presents with deficits primarily related to memory, sustained attention, and problem solving. He had difficulty with both storage and retrieval when presented with five novel words. He had significant difficutly recalling information following a paragraph read aloud and with simple calculations. Pt and his wife both state that he feel that he is at his baseline, although feel that he may benefit from [facility] SLP upon d/c to target these deficits in an effort to ease caregiver burden and maintain CLOF. Will continue following.    SLP Assessment  SLP Recommendation/Assessment: Patient needs continued Speech Lanaguage Pathology Services SLP Visit Diagnosis: Cognitive communication deficit (R41.841)    Recommendations for follow up therapy are one component of a multi-disciplinary discharge planning process, led by the  attending physician.  Recommendations may be updated based on patient status, additional functional criteria and insurance authorization.    Follow Up Recommendations  Home health SLP    Assistance Recommended at Discharge  Intermittent Supervision/Assistance  Functional Status Assessment Patient has had a recent decline in their functional status and demonstrates the  ability to make significant improvements in function in a reasonable and predictable amount of time.  Frequency and Duration min 2x/week  1 week      SLP Evaluation Cognition  Overall Cognitive Status: History of cognitive impairments - at baseline Arousal/Alertness: Awake/alert Orientation Level: Oriented X4 Attention: Sustained Sustained Attention: Impaired Sustained Attention Impairment: Verbal basic Memory: Impaired Memory Impairment: Storage deficit;Retrieval deficit Awareness: Appears intact Problem Solving: Impaired Problem Solving Impairment: Verbal basic       Comprehension  Auditory Comprehension Overall Auditory Comprehension: Appears within functional limits for tasks assessed    Expression Expression Primary Mode of Expression: Verbal Verbal Expression Overall Verbal Expression: Appears within functional limits for tasks assessed   Oral / Motor  Oral Motor/Sensory Function Overall Oral Motor/Sensory Function: Within functional limits Motor Speech Overall Motor Speech: Appears within functional limits for tasks assessed            Gwynneth Aliment, M.A., CF-SLP Speech Language Pathology, Acute Rehabilitation Services  Secure Chat preferred [phone]  11/03/2023, 2:24 PM

## 2023-11-03 NOTE — Progress Notes (Addendum)
STROKE TEAM PROGRESS NOTE   BRIEF HPI Mr. Rick Melendez is a 87 y.o. male with history of diabetes, hypertension, vertigo, hyperlipidemia, sleep apnea, PVD and CAD presenting with weakness and dizziness.  Symptoms began on Saturday morning, and patient fell after getting out of bed.  He thought that this was due to vertigo and took a meclizine and rested.  However, he was more weak when he got up again.  He was sent to the hospital after seeing his primary care regarding this problem.  MRI shows small right paramedian pontine infarct.  He was found to have A-fib on the monitor and has been started on Eliquis.  INTERIM HISTORY/SUBJECTIVE  Patient has been hemodynamically stable overnight and reports no acute problems.  OBJECTIVE  CBC    Component Value Date/Time   WBC 11.5 (H) 11/03/2023 0427   RBC 5.87 (H) 11/03/2023 0427   HGB 14.7 11/03/2023 0427   HCT 48.1 11/03/2023 0427   PLT 231 11/03/2023 0427   MCV 81.9 11/03/2023 0427   MCH 25.0 (L) 11/03/2023 0427   MCHC 30.6 11/03/2023 0427   RDW 15.4 11/03/2023 0427   LYMPHSABS 2.7 11/01/2023 1634   MONOABS 1.0 11/01/2023 1634   EOSABS 0.1 11/01/2023 1634   BASOSABS 0.1 11/01/2023 1634    BMET    Component Value Date/Time   NA 136 11/03/2023 0427   K 3.8 11/03/2023 0427   CL 101 11/03/2023 0427   CO2 24 11/03/2023 0427   GLUCOSE 124 (H) 11/03/2023 0427   BUN 19 11/03/2023 0427   CREATININE 1.17 11/03/2023 0427   CALCIUM 9.2 11/03/2023 0427   GFRNONAA >60 11/03/2023 0427    IMAGING past 24 hours MR ANGIO HEAD WO CONTRAST  Result Date: 11/02/2023 CLINICAL DATA:  Initial evaluation for acute stroke. EXAM: MRA HEAD WITHOUT CONTRAST TECHNIQUE: Angiographic images of the Circle of Willis were acquired using MRA technique without intravenous contrast. COMPARISON:  Prior brain MRI from earlier the same day. FINDINGS: Anterior circulation: Both internal carotid arteries are patent to the termini without stenosis or other  abnormality. A1 segments patent bilaterally. Normal anterior communicating artery complex. Anterior cerebral arteries patent without stenosis. No M1 stenosis or occlusion. Distal MCA branches grossly perfused and symmetric. Posterior circulation: Both V4 segments patent without stenosis. Right vertebral artery slightly dominant. Right PICA patent. Left PICA not seen. Basilar patent without stenosis. Superior cerebellar and posterior cerebral arteries patent bilaterally. Anatomic variants: None significant. Other: No intracranial aneurysm. IMPRESSION: Normal intracranial MRA. Electronically Signed   By: Rise Mu M.D.   On: 11/02/2023 20:18    Vitals:   11/02/23 2359 11/03/23 0405 11/03/23 0752 11/03/23 1144  BP: (!) 113/48 (!) 98/56 (!) 149/82 137/85  Pulse: 92 85 82 (!) 103  Resp: 18 17 17 18   Temp: 97.8 F (36.6 C) 97.8 F (36.6 C) 97.7 F (36.5 C)   TempSrc:  Oral Oral   SpO2: 94% 93% 97% 100%  Weight:      Height:         PHYSICAL EXAM General:  Alert, well-nourished, well-developed patient in no acute distress Psych:  Mood and affect appropriate for situation CV: Regular rate and rhythm on monitor Respiratory:  Regular, unlabored respirations on room air   NEURO:  Mental Status: AA&Ox3, patient is able to give clear and coherent history Speech/Language: speech is without dysarthria or aphasia.    Cranial Nerves:  II: PERRL. Visual fields full.  III, IV, VI: EOMI. Eyelids elevate symmetrically.  V:  Sensation is intact to light touch and symmetrical to face.  VII: Right facial droop VIII: hearing intact to voice. IX, X: Palate elevates symmetrically. Phonation is normal.  NW:GNFAOZHY shrug 5/5. XII: tongue is midline without fasciculations. Motor: Able to move all 4 extremities with good antigravity strength but slight weakness of the left leg compared to the right Tone: is normal and bulk is normal Sensation- Intact to light touch bilaterally.   Coordination:  FTN with ataxia on the left, dysmetria noted with heel-knee-shin bilaterally Gait- deferred  ASSESSMENT/PLAN  Acute Ischemic Infarct:  right paramedian pontine infarct, etiology: Likely Small vessel disease.  However, patient does have new diagnosed A-fib CT head No acute abnormality. Small vessel disease.  MRI small right paramedian pontine infarct MRA no LVO or hemodynamically significant stenosis this Carotid Doppler unremarkable 2D Echo EF 60 to 65%, normal left atrial size, no atrial level shunt LDL 26 HgbA1c 6.9 UDS negative VTE prophylaxis -fully anticoagulated with Eliquis aspirin 81 mg daily and clopidogrel 75 mg daily prior to admission, now on Eliquis (apixaban) twice daily Therapy recommendations: Home health PT Disposition: Home today.  Atrial fibrillation, new diagnosed Was on aspirin and platelet home EKG confirmed A-fib Begin anticoagulation with Eliquis Continue Eliquis on discharge  Hypertension Home meds: Metoprolol 12.5 mg daily Stable On metoprolol Maintain normotension  Hyperlipidemia Home meds: Rosuvastatin 40 mg daily and ezetimibe 10 mg daily, resumed in hospital LDL 26, goal < 70 Continue statin and Zetia at discharge  Diabetes type II Controlled Home meds:  Farxiga 10 mg daily HgbA1c 6.9, goal < 7.0 CBGs SSI Recommend close follow-up with PCP for better DM control  Tobacco Abuse Patient is a former smoker but uses chewing tobacco Cessation education provided  Other Stroke Risk Factors Coronary artery disease Obstructive sleep apnea, on CPAP at home PVD  Other Active Problems Mild leukocytosis, WBC 11.5  Hospital day # 0  Patient seen by NP and then by MD, MD to edit note as needed. Rick Melendez , MSN, AGACNP-BC [facility] See Amion for schedule and pager information 11/03/2023 12:56 PM   ATTENDING NOTE: I reviewed above note and agree with the assessment and plan. Pt was seen and examined.   Wife at  bedside.  Patient lying bed, denies any dizziness currently.  Left-sided weakness much improved.  MRI showed right pontine infarct, location consistent with small vessel disease.  However patient does have new diagnosed A-fib, currently on Eliquis.  Continue home Crestor and Zetia.  PT recommend home health PT.  For detailed assessment and plan, please refer to above/below as I have made changes wherever appropriate.   Neurology will sign off. Please call with questions. Pt will follow up with [facility] NP on 01/17/24 at [facility]. Thanks for the consult.   Marvel Plan, MD PhD Stroke Neurology 11/03/2023 7:18 PM    To contact Stroke Continuity provider, please refer to [email or web address]. After hours, contact General Neurology

## 2023-11-03 NOTE — TOC Initial Note (Signed)
Transition of Care [facility]) - Initial/Assessment Note    Patient Details  Name: Rick Melendez MRN: [id number] Date of Birth: 1936/03/04  Transition of Care [facility]) CM/SW Contact:    Kermit Balo, RN Phone Number: 11/03/2023, 12:50 PM  Clinical Narrative:                  Pt is from home with his spouse. They are together most of the time.  Pt drives self as needed but wife can provide transportation. Pt manages his own medications at home but wife can over see them once they return home.  PT recommending HH and OT SNF. They prefer home with HH. Choice provided with medicare.gov and Frances Furbish decided on. Information on the AVS. Frances Furbish will contact her for the first home visit. TOC following.  Expected Discharge Plan: Home w Home Health Services Barriers to Discharge: Continued Medical Work up   Patient Goals and CMS Choice   CMS Medicare.gov Compare Post Acute Care list provided to:: Patient Choice offered to / list presented to : Patient, Spouse      Expected Discharge Plan and Services   Discharge Planning Services: CM Consult Post Acute Care Choice: Home Health Living arrangements for the past 2 months: Single Family Home                           HH Arranged: PT, OT HH Agency: [facility] Care Date [facility] Agency Contacted: 11/03/23   Representative spoke with at [facility] Agency: Kandee Keen  Prior Living Arrangements/Services Living arrangements for the past 2 months: Single Family Home Lives with:: Spouse Patient language and need for interpreter reviewed:: Yes Do you feel safe going back to the place where you live?: Yes        Care giver support system in place?: Yes (comment) Current home services: DME (walker/ tub bench) Criminal Activity/Legal Involvement Pertinent to Current Situation/Hospitalization: No - Comment as needed  Activities of Daily Living   ADL Screening (condition at time of admission) Independently performs ADLs?: No Does the patient have a NEW  difficulty with bathing/dressing/toileting/self-feeding that is expected to last >3 days?: Yes (Initiates electronic notice to provider for possible OT consult) (new stroke) Does the patient have a NEW difficulty with getting in/out of bed, walking, or climbing stairs that is expected to last >3 days?: Yes (Initiates electronic notice to provider for possible PT consult) (new stroke) Does the patient have a NEW difficulty with communication that is expected to last >3 days?: No Is the patient deaf or have difficulty hearing?: Yes Does the patient have difficulty seeing, even when wearing glasses/contacts?: No Does the patient have difficulty concentrating, remembering, or making decisions?: Yes  Permission Sought/Granted                  Emotional Assessment Appearance:: Appears stated age Attitude/Demeanor/Rapport:  (HOH) Affect (typically observed): Accepting Orientation: : Oriented to Self, Oriented to Place, Oriented to  Time, Oriented to Situation   Psych Involvement: No (comment)  Admission diagnosis:  Cerebrovascular accident (CVA), unspecified mechanism (HCC) [I63.9] Atrial fibrillation, unspecified type (HCC) [I48.91] Acute stroke due to ischemia [facility]) [I63.9] Patient Active Problem List   Diagnosis Date Noted   Acute stroke due to ischemia (HCC) 11/02/2023   DOE (dyspnea on exertion) 08/11/2023   Upper airway cough syndrome 08/11/2023   Slow heart rate 04/14/2022   Influenza B    AKI (acute kidney injury) (HCC)  CAP (community acquired pneumonia) 03/19/2017   Bilateral primary osteoarthritis of knee 01/28/2017   Impingement syndrome of right shoulder 01/28/2017   CAD - CABG X 2 9/07 LIMA-LAD, SVG-OM. Low risk Myoview 7/12 09/07/2013   Essential hypertension 09/07/2013   Dyslipidemia 09/07/2013   Diabetes mellitus (HCC) 09/07/2013   Chronic renal insufficiency, stage III (moderate)- SCr 1.6 (Aug 2013) 09/07/2013   Sleep apnea- compliant with C-pap 09/07/2013    PVD (peripheral vascular disease)- Rt iliac disease by doppler 7/13 09/07/2013   PCP:  Merri Brunette, MD Pharmacy:   [facility] [phone] - [place], [place] - [address] [place] [address] Phone: [phone] Fax: [phone]  [facility] Delivery - [place], [place] - [address] [place] [address] Phone: [phone] Fax: [phone]     Social Determinants of Health (SDOH) Social History: SDOH Screenings   Food Insecurity: No Food Insecurity (11/02/2023)  Housing: Low Risk  (11/02/2023)  Transportation Needs: No Transportation Needs (11/02/2023)  Utilities: Not At Risk (11/02/2023)  Social Connections: Unknown (09/16/2022)   Received from [facility]  Tobacco Use: High Risk (11/01/2023)   SDOH Interventions:     Readmission Risk Interventions     No data to display

## 2023-11-03 NOTE — TOC Transition Note (Signed)
Transition of Care [facility]) - CM/SW Discharge Note   Patient Details  Name: Rick Melendez MRN: [id number] Date of Birth: 11-04-1936  Transition of Care [facility]) CM/SW Contact:  Kermit Balo, RN Phone Number: 11/03/2023, 1:30 PM   Clinical Narrative:     Pt is discharging home with home health through [place].  CM provided his spouse with 30 day free card for Eliquis. Wife will transport home.  Final next level of care: Home w Home Health Services Barriers to Discharge: No Barriers Identified   Patient Goals and CMS Choice CMS Medicare.gov Compare Post Acute Care list provided to:: Patient Choice offered to / list presented to : Patient, Spouse  Discharge Placement                         Discharge Plan and Services Additional resources added to the After Visit Summary for     Discharge Planning Services: CM Consult Post Acute Care Choice: Home Health                    HH Arranged: PT, OT [facility] Agency: [facility] Health Care Date [facility] Agency Contacted: 11/03/23   Representative spoke with at [facility] Agency: Kandee Keen  Social Determinants of Health (SDOH) Interventions SDOH Screenings   Food Insecurity: No Food Insecurity (11/02/2023)  Housing: Low Risk  (11/02/2023)  Transportation Needs: No Transportation Needs (11/02/2023)  Utilities: Not At Risk (11/02/2023)  Social Connections: Unknown (09/16/2022)   Received from [facility]  Tobacco Use: High Risk (11/01/2023)     Readmission Risk Interventions     No data to display

## 2023-11-03 NOTE — Discharge Instructions (Signed)
Information on my medicine - ELIQUIS (apixaban)  This medication education was reviewed with me or my healthcare representative as part of my discharge preparation.     Why was Eliquis prescribed for you? Eliquis was prescribed for you to reduce the risk of a blood clot forming that can cause a stroke if you have a medical condition called atrial fibrillation (a type of irregular heartbeat).  What do You need to know about Eliquis ? Take your Eliquis TWICE DAILY - one tablet in the morning and one tablet in the evening with or without food. If you have difficulty swallowing the tablet whole please discuss with your pharmacist how to take the medication safely.  Take Eliquis exactly as prescribed by your doctor and DO NOT stop taking Eliquis without talking to the doctor who prescribed the medication.  Stopping may increase your risk of developing a stroke.  Refill your prescription before you run out.  After discharge, you should have regular check-up appointments with your healthcare provider that is prescribing your Eliquis.  In the future your dose may need to be changed if your kidney function or weight changes by a significant amount or as you get older.  What do you do if you miss a dose? If you miss a dose, take it as soon as you remember on the same day and resume taking twice daily.  Do not take more than one dose of ELIQUIS at the same time to make up a missed dose.  Important Safety Information A possible side effect of Eliquis is bleeding. You should call your healthcare provider right away if you experience any of the following: Bleeding from an injury or your nose that does not stop. Unusual colored urine (red or dark brown) or unusual colored stools (red or black). Unusual bruising for unknown reasons. A serious fall or if you hit your head (even if there is no bleeding).  Some medicines may interact with Eliquis and might increase your risk of bleeding or clotting  while on Eliquis. To help avoid this, consult your healthcare provider or pharmacist prior to using any new prescription or non-prescription medications, including herbals, vitamins, non-steroidal anti-inflammatory drugs (NSAIDs) and supplements.  This website has more information on Eliquis (apixaban): http://www.eliquis.com/eliquis/home =======================================  Atrial Fibrillation    Atrial fibrillation is a type of heartbeat that is irregular or fast. If you have this condition, your heart beats without any order. This makes it hard for your heart to pump blood in a normal way. Atrial fibrillation may come and go, or it may become a long-lasting problem. If this condition is not treated, it can put you at higher risk for stroke, heart failure, and other heart problems.  What are the causes? This condition may be caused by diseases that damage the heart. They include: High blood pressure. Heart failure. Heart valve disease. Heart surgery. Other causes include: Diabetes. Thyroid disease. Being overweight. Kidney disease. Sometimes the cause is not known.  What increases the risk? You are more likely to develop this condition if: You are older. You smoke. You exercise often and very hard. You have a family history of this condition. You are a man. You use drugs. You drink a lot of alcohol. You have lung conditions, such as emphysema, pneumonia, or COPD. You have sleep apnea.  What are the signs or symptoms? Common symptoms of this condition include: A feeling that your heart is beating very fast. Chest pain or discomfort. Feeling short of breath. Suddenly feeling   light-headed or weak. Getting tired easily during activity. Fainting. Sweating. In some cases, there are no symptoms.  How is this treated? Treatment for this condition depends on underlying conditions and how you feel when you have atrial fibrillation. They include: Medicines to: Prevent  blood clots. Treat heart rate or heart rhythm problems. Using devices, such as a pacemaker, to correct heart rhythm problems. Doing surgery to remove the part of the heart that sends bad signals. Closing an area where clots can form in the heart (left atrial appendage). In some cases, your doctor will treat other underlying conditions.  Follow these instructions at home:  Medicines Take over-the-counter and prescription medicines only as told by your doctor. Do not take any new medicines without first talking to your doctor. If you are taking blood thinners: Talk with your doctor before you take any medicines that have aspirin or NSAIDs, such as ibuprofen, in them. Take your medicine exactly as told by your doctor. Take it at the same time each day. Avoid activities that could hurt or bruise you. Follow instructions about how to prevent falls. Wear a bracelet that says you are taking blood thinners. Or, carry a card that lists what medicines you take. Lifestyle         Do not use any products that have nicotine or tobacco in them. These include cigarettes, e-cigarettes, and chewing tobacco. If you need help quitting, ask your doctor. Eat heart-healthy foods. Talk with your doctor about the right eating plan for you. Exercise regularly as told by your doctor. Do not drink alcohol. Lose weight if you are overweight. Do not use drugs, including cannabis.  General instructions If you have a condition that causes breathing to stop for a short period of time (apnea), treat it as told by your doctor. Keep a healthy weight. Do not use diet pills unless your doctor says they are safe for you. Diet pills may make heart problems worse. Keep all follow-up visits as told by your doctor. This is important.  Contact a doctor if: You notice a change in the speed, rhythm, or strength of your heartbeat. You are taking a blood-thinning medicine and you get more bruising. You get tired more easily  when you move or exercise. You have a sudden change in weight.  Get help right away if:    You have pain in your chest or your belly (abdomen). You have trouble breathing. You have side effects of blood thinners, such as blood in your vomit, poop (stool), or pee (urine), or bleeding that cannot stop. You have any signs of a stroke. "BE FAST" is an easy way to remember the main warning signs: B - Balance. Signs are dizziness, sudden trouble walking, or loss of balance. E - Eyes. Signs are trouble seeing or a change in how you see. F - Face. Signs are sudden weakness or loss of feeling in the face, or the face or eyelid drooping on one side. A - Arms. Signs are weakness or loss of feeling in an arm. This happens suddenly and usually on one side of the body. S - Speech. Signs are sudden trouble speaking, slurred speech, or trouble understanding what people say. T - Time. Time to call emergency services. Write down what time symptoms started. You have other signs of a stroke, such as: A sudden, very bad headache with no known cause. Feeling like you may vomit (nausea). Vomiting. A seizure.  These symptoms may be an emergency. Do not wait to   see if the symptoms will go away. Get medical help right away. Call your local emergency services (911 in the [place] Do not drive yourself to the hospital. Summary Atrial fibrillation is a type of heartbeat that is irregular or fast. You are at higher risk of this condition if you smoke, are older, have diabetes, or are overweight. Follow your doctor's instructions about medicines, diet, exercise, and follow-up visits. Get help right away if you have signs or symptoms of a stroke. Get help right away if you cannot catch your breath, or you have chest pain or discomfort. This information is not intended to replace advice given to you by your health care provider. Make sure you discuss any questions you have with your health care provider. Document  Revised: 05/31/2019 Document Reviewed: 05/31/2019 Elsevier Patient Education  2020 Elsevier Inc.    

## 2023-11-03 NOTE — Plan of Care (Signed)
  Problem: Education: Goal: Ability to describe self-care measures that may prevent or decrease complications (Diabetes Survival Skills Education) will improve Outcome: Progressing Goal: Individualized Educational Video(s) Outcome: Progressing   Problem: Coping: Goal: Ability to adjust to condition or change in health will improve Outcome: Progressing   Problem: Fluid Volume: Goal: Ability to maintain a balanced intake and output will improve Outcome: Progressing   Problem: Health Behavior/Discharge Planning: Goal: Ability to identify and utilize available resources and services will improve Outcome: Progressing Goal: Ability to manage health-related needs will improve Outcome: Progressing   Problem: Metabolic: Goal: Ability to maintain appropriate glucose levels will improve Outcome: Progressing   Problem: Nutritional: Goal: Maintenance of adequate nutrition will improve Outcome: Progressing Goal: Progress toward achieving an optimal weight will improve Outcome: Progressing   Problem: Tissue Perfusion: Goal: Adequacy of tissue perfusion will improve Outcome: Progressing   Problem: Education: Goal: Knowledge of General Education information will improve Description: Including pain rating scale, medication(s)/side effects and non-pharmacologic comfort measures Outcome: Progressing   Problem: Health Behavior/Discharge Planning: Goal: Ability to manage health-related needs will improve Outcome: Progressing   Problem: Clinical Measurements: Goal: Ability to maintain clinical measurements within normal limits will improve Outcome: Progressing Goal: Will remain free from infection Outcome: Progressing Goal: Diagnostic test results will improve Outcome: Progressing Goal: Respiratory complications will improve Outcome: Progressing Goal: Cardiovascular complication will be avoided Outcome: Progressing   Problem: Activity: Goal: Risk for activity intolerance will  decrease Outcome: Progressing   Problem: Nutrition: Goal: Adequate nutrition will be maintained Outcome: Progressing   Problem: Elimination: Goal: Will not experience complications related to bowel motility Outcome: Progressing Goal: Will not experience complications related to urinary retention Outcome: Progressing   Problem: Pain Management: Goal: General experience of comfort will improve Outcome: Progressing

## 2023-11-03 NOTE — Progress Notes (Signed)
Physical Therapy Treatment Patient Details Name: Rick Melendez MRN: [id number] DOB: Apr 18, 1936 Today's Date: 11/03/2023   History of Present Illness 87 y.o. male with PMH significant for DM2, HTN, HLD, OSA on CPAP, CAD s/p CABG 2007, PAD s/p left leg stenting, osteoarthritis  11/11, patient presented to the ED with complaint of weakness, dizziness for few days.  MR Brain:small acute infarct in the right paramedian pons.    PT Comments  Pt greeted resting in bed and agreeable to session, however mobility progression limited by increased HR with all activity in afib rhythm up to 160bpm, pt asymptomatic, RN aware. Pt requiring light min A to elevate trunk to sitting as pt reaching out for HHA. Pt able to come to standing with CGA for cues for optimal hand placement and demonstrate short gait with CGA and rollator support. Pt was educated on continued walker use to maximize functional independence, safety, and decrease risk for falls.  Pt continues to benefit from skilled PT services to progress toward functional mobility goals.    HR 102bpm at rest; 129-160bpm with activity    If plan is discharge home, recommend the following: A little help with walking and/or transfers;A little help with bathing/dressing/bathroom;Assistance with cooking/housework;Assist for transportation;Help with stairs or ramp for entrance   Can travel by private vehicle        Equipment Recommendations  None recommended by PT    Recommendations for Other Services       Precautions / Restrictions Precautions Precautions: Fall Restrictions Weight Bearing Restrictions: No     Mobility  Bed Mobility Overal bed mobility: Needs Assistance Bed Mobility: Supine to Sit     Supine to sit: Min assist     General bed mobility comments: light min A to elevate trunk to sitting with pt reaching out for HHA    Transfers Overall transfer level: Needs assistance Equipment used: Rollator (4 wheels) Transfers: Sit  to/from Stand Sit to Stand: Supervision           General transfer comment: cues for hand placement    Ambulation/Gait Ambulation/Gait assistance: Contact guard assist Gait Distance (Feet): 50 Feet Assistive device: Rollator (4 wheels) Gait Pattern/deviations: Step-through pattern, Decreased stride length, Trunk flexed Gait velocity: decr     General Gait Details: CGA for safety, distance limited by increased HR up to 160bpm   Stairs             Wheelchair Mobility     Tilt Bed    Modified Rankin (Stroke Patients Only) Modified Rankin (Stroke Patients Only) Pre-Morbid Rankin Score: Slight disability Modified Rankin: Moderately severe disability     Balance Overall balance assessment: Needs assistance Sitting-balance support: Feet supported Sitting balance-Leahy Scale: Good     Standing balance support: Single extremity supported, Bilateral upper extremity supported Standing balance-Leahy Scale: Poor Standing balance comment: relint on UE support during dynamic tasks                            Cognition Arousal: Alert Behavior During Therapy: WFL for tasks assessed/performed Overall Cognitive Status: Within Functional Limits for tasks assessed                                          Exercises General Exercises - Lower Extremity Hip Flexion/Marching: AROM, Right, Left, 20 reps    General Comments General comments (  skin integrity, edema, etc.): HR up to 160bp, in afib rhythm during standing activity, RN aware      Pertinent Vitals/Pain Pain Assessment Pain Assessment: Faces Faces Pain Scale: Hurts little more Pain Location: low back- chonic Pain Descriptors / Indicators: Sore, Discomfort Pain Intervention(s): Monitored during session, Limited activity within patient's tolerance    Home Living                          Prior Function            PT Goals (current goals can now be found in the care plan  section) Acute Rehab PT Goals Patient Stated Goal: return to prior function PT Goal Formulation: With patient Time For Goal Achievement: 11/16/23 Progress towards PT goals: Progressing toward goals    Frequency    Min 1X/week      PT Plan      Co-evaluation              AM-PAC PT "6 Clicks" Mobility   Outcome Measure  Help needed turning from your back to your side while in a flat bed without using bedrails?: None Help needed moving from lying on your back to sitting on the side of a flat bed without using bedrails?: A Little Help needed moving to and from a bed to a chair (including a wheelchair)?: A Little Help needed standing up from a chair using your arms (e.g., wheelchair or bedside chair)?: A Little Help needed to walk in hospital room?: A Little Help needed climbing 3-5 steps with a railing? : A Little 6 Click Score: 19    End of Session Equipment Utilized During Treatment: Gait belt Activity Tolerance: Treatment limited secondary to medical complications (Comment) (limited by increased HR) Patient left: with call bell/phone within reach;in bed;with family/visitor present (seated up EOB) Nurse Communication: Mobility status;Other (comment) (HR) PT Visit Diagnosis: Unsteadiness on feet (R26.81);History of falling (Z91.81)     Time: 1005-1025 PT Time Calculation (min) (ACUTE ONLY): 20 min  Charges:    $Gait Training: 8-22 mins PT General Charges $$ ACUTE PT VISIT: 1 Visit                     Rick Melendez R. PTA Acute Rehabilitation Services Office: [phone]   Rick Melendez 11/03/2023, 10:30 AM

## 2023-11-03 NOTE — Discharge Summary (Signed)
Physician Discharge Summary  Rick Melendez ZOX:[id number] DOB: Dec 05, 1936 DOA: 11/01/2023  PCP: Merri Brunette, MD  Admit date: 11/01/2023 Discharge date: 11/03/2023  Admitted From: Home Discharge disposition: Home with home health PT  Recommendations at discharge:  You have been started on Eliquis because of A-fib and stroke.  Watch out for any obvious bleeding or black stool or easy bruisability.  Stop aspirin and Plavix Follow-up with neurology as an outpatient  Brief narrative: Rick Melendez is a 87 y.o. male with PMH significant for DM2, HTN, HLD, OSA on CPAP, CAD s/p CABG 2007, PAD s/p left leg stenting, osteoarthritis 11/11, patient presented to the ED with complaint of weakness, dizziness for few days.  Patient started feeling dizzy upon waking up Saturday morning 11/9.  He fell and hit his abdomen on a dresser on the way down.  He thought it might have been because of vertigo and took meclizine that his wife takes for vertigo.  He slept all day and was weaker when he woke up only able to use a walker.  Weakness did not improve in next few days, went to see his PCP and subsequently sent to ED.  In the ED, patient was afebrile, initial heart rate 47, blood pressure 146/84, breathing on room air Labs with CBC and CMP mostly unremarkable Blood alcohol level not elevated Initial CT head did not show any acute intracranial abnormality.  It showed mild chronic small vessel ischemic disease. MRI brain showed Small acute infarct in the right paramedian pons.  EKG with A-fib at 72 bpm Neurology was consulted. Admitted to hospitalist service  Subjective: Patient was seen and examined this morning.  [name].  Lying on bed.  Not in distress.  Wife at bedside.  Dysarthria gradually improving. Ambulated with PT  Hospital course: Acute stroke Symptoms started 4 days prior to presentation on 11/9 On admission had persistent left-sided weakness, dysarthria MRI  brain showed small acute infarct in the right paramedian pons EKG with new onset A-fib, probable cause stroke. Stroke workup was completed. Carotid duplex did not show any significant stenosis. Echocardiogram showed EF 60 to 65%, no intracardiac source of embolism. A1c 6.9, LDL 26 PT/OT/ST eval obtained.  Home health PT recommended PTA meds-aspirin 81 mg daily, Plavix 75 mg daily, Crestor 40 mg daily Given new onset A-fib, patient was switched to Eliquis.  Patient and his wife have been instructed to stop aspirin and Plavix.  New onset A-fib No prior history of A-fib.   EKG on admission showed A-fib/flutter at a rate of 72 bpm PTA meds-metoprolol 12.5 mg daily.  Since A-fib is rate controlled, patient will continue the same dose of metoprolol. After discussing with neurology, patient was started on Eliquis 5 mg twice daily  CAD s/p CABG 2007 PAD s/p left leg stenting HLD Eliquis, statin, metoprolol  HTN PTA meds- metoprolol, Lasix, finerenone Continue all  Type 2 diabetes mellitus A1c 6.9 on 11/01/2023 PTA meds-Farxiga Continue the same Recent Labs  Lab 11/02/23 0404 11/02/23 1210 11/02/23 1843 11/02/23 2113 11/03/23 1143  GLUCAP 145* 136* 109* 136* 143*   OSA  on CPAP  Osteoarthritis Tylenol as needed   Goals of care   Code Status: Full Code      Wounds:  -    Discharge Exam:   Vitals:   11/02/23 2359 11/03/23 0405 11/03/23 0752 11/03/23 1144  BP: (!) 113/48 (!) 98/56 (!) 149/82 137/85  Pulse: 92 85 82 (!) 103  Resp: 18 17  17 18  Temp: 97.8 F (36.6 C) 97.8 F (36.6 C) 97.7 F (36.5 C) 97.9 F (36.6 C)  TempSrc:  Oral Oral Oral  SpO2: 94% 93% 97% 100%  Weight:      Height:        Body mass index is 29.13 kg/m.  General exam: [name].  Lying down in bed.  Not in pain Skin: No rashes, lesions or ulcers. HEENT: Atraumatic, normocephalic, no obvious bleeding Lungs: Clear to auscultation bilaterally CVS: Regular rate  and rhythm, no murmur GI/Abd soft, nontender, nondistended, bowel sound present CNS: Alert, awake, oriented x 3.  Dysarthria improving.  Left upper and lower extremities strength improving. Psychiatry: Mood appropriate Extremities: No pedal edema, no calf tenderness  Follow ups:    Follow-up Information     [facility] Follow up.   Specialty: Home Health Services Why: The home health agency will contact you for the first home visit Contact information: [address] [place] [address] [phone]         Merri Brunette, MD Follow up.   Specialty: Internal Medicine Contact information: [address] [place] [address] [phone]                 Discharge Instructions:   Discharge Instructions     Call MD for:  difficulty breathing, headache or visual disturbances   Complete by: As directed    Call MD for:  extreme fatigue   Complete by: As directed    Call MD for:  hives   Complete by: As directed    Call MD for:  persistant dizziness or light-headedness   Complete by: As directed    Call MD for:  persistant nausea and vomiting   Complete by: As directed    Call MD for:  severe uncontrolled pain   Complete by: As directed    Call MD for:  temperature >100.4   Complete by: As directed    Diet - low sodium heart healthy   Complete by: As directed    Diet Carb Modified   Complete by: As directed    Discharge instructions   Complete by: As directed    Recommendations at discharge:   You have been started on Eliquis because of A-fib and stroke.  Watch out for any obvious bleeding or black stool or easy bruisability.  Stop aspirin and Plavix  Follow-up with neurology as an outpatient  Discharge instructions for diabetes mellitus: Check blood sugar 3 times a day and bedtime at home. If blood sugar running above 200 or less than 70 please call your MD to adjust insulin. If you notice signs and symptoms of  hypoglycemia (low blood sugar) like jitteriness, confusion, thirst, tremor and sweating, please check blood sugar, drink sugary drink/biscuits/sweets to increase sugar level and call MD or return to ER.    General discharge instructions: Follow with Primary MD Merri Brunette, MD in 7 days  Please request your PCP  to go over your hospital tests, procedures, radiology results at the follow up. Please get your medicines reviewed and adjusted.  Your PCP may decide to repeat certain labs or tests as needed. Do not drive, operate heavy machinery, perform activities at heights, swimming or participation in water activities or provide baby sitting services if your were admitted for syncope or siezures until you have seen by Primary MD or a Neurologist and advised to do so again. [place] Controlled Substance Reporting System  database was reviewed. Do not drive, operate heavy machinery, perform activities at heights, swim, participate in water activities or provide baby-sitting services while on medications for pain, sleep and mood until your outpatient physician has reevaluated you and advised to do so again.  You are strongly recommended to comply with the dose, frequency and duration of prescribed medications. Activity: As tolerated with Full fall precautions use walker/cane & assistance as needed Avoid using any recreational substances like cigarette, tobacco, alcohol, or non-prescribed drug. If you experience worsening of your admission symptoms, develop shortness of breath, life threatening emergency, suicidal or homicidal thoughts you must seek medical attention immediately by calling 911 or calling your MD immediately  if symptoms less severe. You must read complete instructions/literature along with all the possible adverse reactions/side effects for all the medicines you take and that have been prescribed to you. Take any new medicine only after you have completely understood and accepted all the  possible adverse reactions/side effects.  Wear Seat belts while driving. You were cared for by a hospitalist during your hospital stay. If you have any questions about your discharge medications or the care you received while you were in the hospital after you are discharged, you can call the unit and ask to speak with the hospitalist or the covering physician. Once you are discharged, your primary care physician will handle any further medical issues. Please note that NO REFILLS for any discharge medications will be authorized once you are discharged, as it is imperative that you return to your primary care physician (or establish a relationship with a primary care physician if you do not have one).   Increase activity slowly   Complete by: As directed        Discharge Medications:   Allergies as of 11/03/2023       Reactions   Ozempic (0.25 Or 0.5 Mg-dose) [semaglutide(0.25 Or 0.5mg -dos)] Other (See Comments)   Hallucinations, lethargic         Medication List     STOP taking these medications    aspirin EC 81 MG tablet   clopidogrel 75 MG tablet Commonly known as: PLAVIX       TAKE these medications    apixaban 5 MG Tabs tablet Commonly known as: ELIQUIS Take 1 tablet (5 mg total) by mouth 2 (two) times daily.   B-12 PO Take 1 tablet by mouth daily.   ezetimibe 10 MG tablet Commonly known as: ZETIA Take 10 mg by mouth daily.   famotidine 20 MG tablet Commonly known as: PEPCID Take 20 mg by mouth 2 (two) times daily.   Farxiga 10 MG Tabs tablet Generic drug: dapagliflozin propanediol Take 10 mg by mouth daily.   furosemide 20 MG tablet Commonly known as: LASIX Take 20 mg by mouth daily.   ipratropium 0.06 % nasal spray Commonly known as: ATROVENT Place 1 spray into the nose 3 (three) times daily as needed for rhinitis.   Kerendia 10 MG Tabs Generic drug: Finerenone Take 10 mg by mouth daily.   meclizine 25 MG tablet Commonly known as:  ANTIVERT Take 25 mg by mouth 3 (three) times daily as needed for dizziness.   memantine 5 MG tablet Commonly known as: NAMENDA Take 1 tablet  twice a day   metoprolol tartrate 25 MG tablet Commonly known as: LOPRESSOR Take 12.5 mg by mouth daily.   OVER THE COUNTER MEDICATION USES CPAP NIGHTLY   Proventil HFA 108 (90 Base) MCG/ACT inhaler Generic drug: albuterol Inhale 2 puffs  into the lungs every 4 (four) hours as needed for wheezing or shortness of breath.   rosuvastatin 40 MG tablet Commonly known as: CRESTOR Take 40 mg by mouth daily.   traMADol 50 MG tablet Commonly known as: ULTRAM Take 50-100 mg by mouth See admin instructions. Take 50mg  (1 tablet) by mouth two to three times a day.         The results of significant diagnostics from this hospitalization (including imaging, microbiology, ancillary and laboratory) are listed below for reference.    Procedures and Diagnostic Studies:   MR ANGIO HEAD WO CONTRAST  Result Date: 11/02/2023 CLINICAL DATA:  Initial evaluation for acute stroke. EXAM: MRA HEAD WITHOUT CONTRAST TECHNIQUE: Angiographic images of the Circle of Willis were acquired using MRA technique without intravenous contrast. COMPARISON:  Prior brain MRI from earlier the same day. FINDINGS: Anterior circulation: Both internal carotid arteries are patent to the termini without stenosis or other abnormality. A1 segments patent bilaterally. Normal anterior communicating artery complex. Anterior cerebral arteries patent without stenosis. No M1 stenosis or occlusion. Distal MCA branches grossly perfused and symmetric. Posterior circulation: Both V4 segments patent without stenosis. Right vertebral artery slightly dominant. Right PICA patent. Left PICA not seen. Basilar patent without stenosis. Superior cerebellar and posterior cerebral arteries patent bilaterally. Anatomic variants: None significant. Other: No intracranial aneurysm. IMPRESSION: Normal intracranial MRA.  Electronically Signed   By: Rise Mu M.D.   On: 11/02/2023 20:18   VAS US CAROTID  Result Date: 11/02/2023 Carotid Arterial Duplex Study Patient Name:  ANDRES KAWCZYNSKI  Date of Exam:   11/02/2023 Medical Rec #: [id number]       Accession #:    [id number] Date of Birth: [birth year]       Patient Gender: M Patient Age:   9 years Exam Location:  [facility] Procedure:      VAS US CAROTID Referring Phys: Lorin Glass --------------------------------------------------------------------------------  Indications:       CVA. Risk Factors:      Hypertension, hyperlipidemia, Diabetes, coronary artery                    disease, PAD. Comparison Study:  No prior exam. Performing Technologist: Fernande Bras  Examination Guidelines: A complete evaluation includes B-mode imaging, spectral Doppler, color Doppler, and power Doppler as needed of all accessible portions of each vessel. Bilateral testing is considered an integral part of a complete examination. Limited examinations for reoccurring indications may be performed as noted.  Right Carotid Findings: +----------+--------+--------+--------+-------------------------+--------+           PSV cm/sEDV cm/sStenosisPlaque Description       Comments +----------+--------+--------+--------+-------------------------+--------+ CCA Prox  92      16                                                +----------+--------+--------+--------+-------------------------+--------+ CCA Distal106     17                                                +----------+--------+--------+--------+-------------------------+--------+ ICA Prox  65      21              heterogenous and calcific         +----------+--------+--------+--------+-------------------------+--------+  ICA Mid   76      20                                                +----------+--------+--------+--------+-------------------------+--------+ ICA Distal70      24                                        tortuous +----------+--------+--------+--------+-------------------------+--------+ ECA       104     13                                                +----------+--------+--------+--------+-------------------------+--------+ +----------+--------+-------+--------+-------------------+           PSV cm/sEDV cmsDescribeArm Pressure (mmHG) +----------+--------+-------+--------+-------------------+ [id number]      15                                 +----------+--------+-------+--------+-------------------+ +---------+--------+--+--------+-+ VertebralPSV cm/s31EDV cm/s9 +---------+--------+--+--------+-+  Left Carotid Findings: +----------+--------+--------+--------+-------------------------+--------+           PSV cm/sEDV cm/sStenosisPlaque Description       Comments +----------+--------+--------+--------+-------------------------+--------+ CCA Prox  120     26                                                +----------+--------+--------+--------+-------------------------+--------+ CCA Distal63      13                                                +----------+--------+--------+--------+-------------------------+--------+ ICA Prox  122     28              heterogenous and calcific         +----------+--------+--------+--------+-------------------------+--------+ ICA Mid   92      27                                                +----------+--------+--------+--------+-------------------------+--------+ ICA Distal64      21                                       tortuous +----------+--------+--------+--------+-------------------------+--------+ ECA       102     13                                                +----------+--------+--------+--------+-------------------------+--------+ +----------+--------+--------+--------+-------------------+           PSV cm/sEDV cm/sDescribeArm Pressure (mmHG)  +----------+--------+--------+--------+-------------------+ [id number]                                          +----------+--------+--------+--------+-------------------+ +---------+--------+--+--------+-+  VertebralPSV cm/s34EDV cm/s8 +---------+--------+--+--------+-+   Summary: Right Carotid: Velocities in the right ICA are consistent with a 1-39% stenosis.                The ECA appears <50% stenosed. Left Carotid: Velocities in the left ICA are consistent with a 1-39% stenosis.               The ECA appears <50% stenosed. Vertebrals:  Bilateral vertebral arteries demonstrate antegrade flow. Subclavians: Normal flow hemodynamics were seen in bilateral subclavian              arteries. *See table(s) above for measurements and observations.  Electronically signed by Lemar Livings MD on 11/02/2023 at 7:00:51 PM.    Final    ECHOCARDIOGRAM COMPLETE BUBBLE STUDY  Result Date: 11/02/2023    ECHOCARDIOGRAM REPORT   Patient Name:   CLERENCE ZECHER Date of Exam: 11/02/2023 Medical Rec #:  [id number]      Height:       67.0 in Accession #:    [id number]     Weight:       202.0 lb Date of Birth:  [birth year]      BSA:          2.031 m Patient Age:    87 years       BP:           140/70 mmHg Patient Gender: M              HR:           96 bpm. Exam Location:  Inpatient Procedure: 2D Echo, Cardiac Doppler, Color Doppler, Saline Contrast Bubble Study            and Intracardiac Opacification Agent Indications:    Stroke  History:        Patient has no prior history of Echocardiogram examinations.                 CAD; Risk Factors:Hypertension and Diabetes.  Sonographer:    Darlys Gales Referring Phys: [id number] Jaylynn Siefert IMPRESSIONS  1. Left ventricular ejection fraction, by estimation, is 60 to 65%. The left ventricle has normal function. The left ventricle has no regional wall motion abnormalities. There is moderate concentric left ventricular hypertrophy. Left ventricular diastolic function could not be  evaluated.  2. Right ventricular systolic function is normal. The right ventricular size is normal. Tricuspid regurgitation signal is inadequate for assessing PA pressure.  3. The mitral valve is degenerative. No evidence of mitral valve regurgitation. No evidence of mitral stenosis. Severe mitral annular calcification.  4. The aortic valve was not well visualized. Aortic valve regurgitation is not visualized. Aortic valve area, by VTI measures 1.22 cm. Aortic valve mean gradient measures 10.8 mmHg. Aortic valve Vmax measures 2.10 m/s.  5. Aortic dilatation noted. There is mild dilatation of the aortic root, measuring 43 mm.  6. The inferior vena cava is normal in size with greater than 50% respiratory variability, suggesting right atrial pressure of 3 mmHg.  7. Agitated saline contrast bubble study was negative, with no evidence of any interatrial shunt. Conclusion(s)/Recommendation(s): No intracardiac source of embolism detected on this transthoracic study. Consider a transesophageal echocardiogram to exclude cardiac source of embolism if clinically indicated. FINDINGS  Left Ventricle: Left ventricular ejection fraction, by estimation, is 60 to 65%. The left ventricle has normal function. The left ventricle has no regional wall motion abnormalities. Definity contrast agent was given IV to delineate the left ventricular  endocardial borders. The left ventricular internal cavity size was normal in size. There is moderate concentric left ventricular hypertrophy. Left ventricular diastolic function could not be evaluated due to atrial fibrillation. Left ventricular diastolic function could not be evaluated. Right Ventricle: The right ventricular size is normal. No increase in right ventricular wall thickness. Right ventricular systolic function is normal. Tricuspid regurgitation signal is inadequate for assessing PA pressure. Left Atrium: Left atrial size was normal in size. Right Atrium: Right atrial size was normal  in size. Pericardium: There is no evidence of pericardial effusion. Mitral Valve: The mitral valve is degenerative in appearance. Severe mitral annular calcification. No evidence of mitral valve regurgitation. No evidence of mitral valve stenosis. Tricuspid Valve: The tricuspid valve is normal in structure. Tricuspid valve regurgitation is not demonstrated. No evidence of tricuspid stenosis. Aortic Valve: The aortic valve was not well visualized. Aortic valve regurgitation is not visualized. Aortic valve mean gradient measures 10.8 mmHg. Aortic valve peak gradient measures 17.6 mmHg. Aortic valve area, by VTI measures 1.22 cm. Pulmonic Valve: The pulmonic valve was normal in structure. Pulmonic valve regurgitation is not visualized. No evidence of pulmonic stenosis. Aorta: Aortic dilatation noted. There is mild dilatation of the aortic root, measuring 43 mm. Venous: The inferior vena cava is normal in size with greater than 50% respiratory variability, suggesting right atrial pressure of 3 mmHg. IAS/Shunts: No atrial level shunt detected by color flow Doppler. Agitated saline contrast was given intravenously to evaluate for intracardiac shunting. Agitated saline contrast bubble study was negative, with no evidence of any interatrial shunt.  LEFT VENTRICLE PLAX 2D LVIDd:         4.10 cm   Diastology LVIDs:         3.10 cm   LV e' medial:    6.09 cm/s LV PW:         1.50 cm   LV E/e' medial:  12.9 LV IVS:        1.30 cm   LV e' lateral:   8.16 cm/s LVOT diam:     2.00 cm   LV E/e' lateral: 9.6 LV SV:         42 LV SV Index:   21 LVOT Area:     3.14 cm  LEFT ATRIUM             Index LA Vol (A2C):   52.8 ml 26.00 ml/m LA Vol (A4C):   44.4 ml 21.86 ml/m LA Biplane Vol: 50.6 ml 24.92 ml/m  AORTIC VALVE AV Area (Vmax):    1.15 cm AV Area (Vmean):   1.13 cm AV Area (VTI):     1.22 cm AV Vmax:           210.00 cm/s AV Vmean:          149.750 cm/s AV VTI:            0.346 m AV Peak Grad:      17.6 mmHg AV Mean Grad:       10.8 mmHg LVOT Vmax:         77.00 cm/s LVOT Vmean:        53.900 cm/s LVOT VTI:          0.134 m LVOT/AV VTI ratio: 0.39  AORTA Ao Root diam: 4.30 cm Ao Asc diam:  3.70 cm MITRAL VALVE MV Area (PHT): 4.96 cm    SHUNTS MV Decel Time: 153 msec    Systemic VTI:  0.13 m MV E velocity: 78.40  cm/s  Systemic Diam: 2.00 cm Armanda Magic MD Electronically signed by Armanda Magic MD Signature Date/Time: 11/02/2023/12:00:20 PM    Final    MR BRAIN WO CONTRAST  Result Date: 11/02/2023 CLINICAL DATA:  Weakness and dizziness that started on Saturday. EXAM: MRI HEAD WITHOUT CONTRAST TECHNIQUE: Multiplanar, multiecho pulse sequences of the brain and surrounding structures were obtained without intravenous contrast. COMPARISON:  Head CT from yesterday FINDINGS: Brain: Small acute infarct in the right paramedian pons. Chronic small vessel ischemic gliosis in the periventricular white matter and to a lesser extent in the pons. Mild for age cerebral volume loss which is generalized. No acute hemorrhage, hydrocephalus, or masslike finding. Vascular: Major flow voids are preserved Skull and upper cervical spine: Normal marrow signal Sinuses/Orbits: Mucosal thickening and debris in the left maxillary sinus where there is atelectasis, chronic findings. Bilateral cataract resection. IMPRESSION: 1. Small acute infarct in the right paramedian pons. 2. Aging brain. Electronically Signed   By: Tiburcio Pea M.D.   On: 11/02/2023 07:53   DG Chest Portable 1 View  Result Date: 11/02/2023 CLINICAL DATA:  Tachypnea and relative hypoxia. EXAM: PORTABLE CHEST 1 VIEW COMPARISON:  03/19/2017 FINDINGS: Chronic interstitial coarsening. Chronic cardiomegaly. There has been prior CABG. No edema, effusion, or pneumothorax. Artifact from EKG leads. IMPRESSION: No acute finding when compared to 2018. Cardiomegaly. Electronically Signed   By: Tiburcio Pea M.D.   On: 11/02/2023 07:49   CT HEAD WO CONTRAST  Result Date: 11/01/2023 CLINICAL  DATA:  Transient ischemic attack (TIA). Dizziness and weakness. EXAM: CT HEAD WITHOUT CONTRAST TECHNIQUE: Contiguous axial images were obtained from the base of the skull through the vertex without intravenous contrast. RADIATION DOSE REDUCTION: This exam was performed according to the departmental dose-optimization program which includes automated exposure control, adjustment of the mA and/or kV according to patient size and/or use of iterative reconstruction technique. COMPARISON:  None Available. FINDINGS: Brain: There is no evidence of an acute infarct, intracranial hemorrhage, mass, midline shift, or extra-axial fluid collection. Cerebral white matter hypodensities are nonspecific but compatible with mild chronic small vessel ischemic disease. There is a chronic right basal ganglia lacunar infarct. Mild cerebral atrophy is within normal limits for age. Vascular: Calcified atherosclerosis at the skull base. No hyperdense vessel. Skull: No acute fracture or suspicious osseous lesion. Sinuses/Orbits: Subtotal opacification of the left maxillary sinus by partially calcified material with associated osteitis consistent with chronic sinusitis. Clear mastoid air cells. Bilateral cataract extraction. Other: None. IMPRESSION: 1. No evidence of acute intracranial abnormality. 2. Mild chronic small vessel ischemic disease. Electronically Signed   By: Sebastian Ache M.D.   On: 11/01/2023 20:08     Labs:   Basic Metabolic Panel: Recent Labs  Lab 11/01/23 1634 11/03/23 0427  NA 139 136  K 4.1 3.8  CL 104 101  CO2 23 24  GLUCOSE 123* 124*  BUN 25* 19  CREATININE 1.23 1.17  CALCIUM 9.1 9.2   GFR Estimated Creatinine Clearance: 49.5 mL/min (by C-G formula based on SCr of 1.17 mg/dL). Liver Function Tests: Recent Labs  Lab 11/01/23 1634  AST 26  ALT 20  ALKPHOS 115  BILITOT 0.6  PROT 6.6  ALBUMIN 3.3*   No results for input(s): "LIPASE", "AMYLASE" in the last 168 hours. No results for input(s):  "AMMONIA" in the last 168 hours. Coagulation profile Recent Labs  Lab 11/01/23 1634  INR 1.1    CBC: Recent Labs  Lab 11/01/23 1634 11/03/23 0427  WBC 9.7 11.5*  NEUTROABS  5.9  --   HGB 15.3 14.7  HCT 51.2 48.1  MCV 83.9 81.9  PLT 253 231   Cardiac Enzymes: No results for input(s): "CKTOTAL", "CKMB", "CKMBINDEX", "TROPONINI" in the last 168 hours. BNP: Invalid input(s): "POCBNP" CBG: Recent Labs  Lab 11/02/23 0404 11/02/23 1210 11/02/23 1843 11/02/23 2113 11/03/23 1143  GLUCAP 145* 136* 109* 136* 143*   D-Dimer No results for input(s): "DDIMER" in the last 72 hours. Hgb A1c Recent Labs    11/01/23 1634  HGBA1C 6.9*   Lipid Profile Recent Labs    11/03/23 0427  CHOL 89  HDL 26*  LDLCALC 26  TRIG 469*  CHOLHDL 3.4   Thyroid function studies Recent Labs    11/03/23 0427  TSH 1.866   Anemia work up No results for input(s): "VITAMINB12", "FOLATE", "FERRITIN", "TIBC", "IRON", "RETICCTPCT" in the last 72 hours. Microbiology No results found for this or any previous visit (from the past 240 hour(s)).  Time coordinating discharge: 45 minutes  Signed: Misael Mcgaha  [facility] 11/03/2023, 1:13 PM

## 2023-11-05 DIAGNOSIS — Z09 Encounter for follow-up examination after completed treatment for conditions other than malignant neoplasm: Secondary | ICD-10-CM | POA: Diagnosis not present

## 2023-11-05 DIAGNOSIS — Z8673 Personal history of transient ischemic attack (TIA), and cerebral infarction without residual deficits: Secondary | ICD-10-CM | POA: Diagnosis not present

## 2023-11-05 DIAGNOSIS — I480 Paroxysmal atrial fibrillation: Secondary | ICD-10-CM | POA: Diagnosis not present

## 2023-11-05 DIAGNOSIS — M542 Cervicalgia: Secondary | ICD-10-CM | POA: Diagnosis not present

## 2023-11-08 DIAGNOSIS — E1151 Type 2 diabetes mellitus with diabetic peripheral angiopathy without gangrene: Secondary | ICD-10-CM | POA: Diagnosis not present

## 2023-11-08 DIAGNOSIS — I69354 Hemiplegia and hemiparesis following cerebral infarction affecting left non-dominant side: Secondary | ICD-10-CM | POA: Diagnosis not present

## 2023-11-08 DIAGNOSIS — E785 Hyperlipidemia, unspecified: Secondary | ICD-10-CM | POA: Diagnosis not present

## 2023-11-08 DIAGNOSIS — I10 Essential (primary) hypertension: Secondary | ICD-10-CM | POA: Diagnosis not present

## 2023-11-08 DIAGNOSIS — I69322 Dysarthria following cerebral infarction: Secondary | ICD-10-CM | POA: Diagnosis not present

## 2023-11-10 DIAGNOSIS — R600 Localized edema: Secondary | ICD-10-CM | POA: Diagnosis not present

## 2023-11-10 DIAGNOSIS — I83812 Varicose veins of left lower extremities with pain: Secondary | ICD-10-CM | POA: Diagnosis not present

## 2023-11-10 DIAGNOSIS — R0989 Other specified symptoms and signs involving the circulatory and respiratory systems: Secondary | ICD-10-CM | POA: Diagnosis not present

## 2023-11-11 DIAGNOSIS — E1151 Type 2 diabetes mellitus with diabetic peripheral angiopathy without gangrene: Secondary | ICD-10-CM | POA: Diagnosis not present

## 2023-11-11 DIAGNOSIS — I69354 Hemiplegia and hemiparesis following cerebral infarction affecting left non-dominant side: Secondary | ICD-10-CM | POA: Diagnosis not present

## 2023-11-11 DIAGNOSIS — I69322 Dysarthria following cerebral infarction: Secondary | ICD-10-CM | POA: Diagnosis not present

## 2023-11-11 DIAGNOSIS — E785 Hyperlipidemia, unspecified: Secondary | ICD-10-CM | POA: Diagnosis not present

## 2023-11-11 DIAGNOSIS — I10 Essential (primary) hypertension: Secondary | ICD-10-CM | POA: Diagnosis not present

## 2023-11-12 ENCOUNTER — Encounter: Payer: Self-pay | Admitting: Physician Assistant

## 2023-11-12 ENCOUNTER — Ambulatory Visit: Payer: Medicare Other | Admitting: Physician Assistant

## 2023-11-12 VITALS — BP 100/60 | HR 77 | Resp 18 | Ht 67.0 in | Wt 186.0 lb

## 2023-11-12 DIAGNOSIS — I639 Cerebral infarction, unspecified: Secondary | ICD-10-CM

## 2023-11-12 DIAGNOSIS — G3184 Mild cognitive impairment, so stated: Secondary | ICD-10-CM | POA: Diagnosis not present

## 2023-11-12 MED ORDER — MEMANTINE HCL 5 MG PO TABS: ORAL_TABLET | ORAL | 3 refills | Status: DC

## 2023-11-12 NOTE — Patient Instructions (Signed)
It was a pleasure to see you today at our office.   Recommendations:  Continue  Memantine  5 mg  twice a day   Follow up in 6 months  Use the hearing aids  Use the CPAP!!! Continue Eliquis twice a day as prescribed. Follow with Cardiology  Whom to call:  Memory  decline, memory medications: Call our office [phone]   For psychiatric meds, mood meds: Please have your primary care physician manage these medications.    For assessment of decision of mental capacity and competency:  Call Dr. Erick Blinks, geriatric psychiatrist at [phone]       If you have any severe symptoms of a stroke, or other severe issues such as confusion,severe chills or fever, etc call 911 or go to the ER as you may need to be evaluated further      RECOMMENDATIONS FOR ALL PATIENTS WITH MEMORY PROBLEMS: 1. Continue to exercise (Recommend 30 minutes of walking everyday, or 3 hours every week) 2. Increase social interactions - continue going to [place] and enjoy social gatherings with friends and family 3. Eat healthy, avoid fried foods and eat more fruits and vegetables 4. Maintain adequate blood pressure, blood sugar, and blood cholesterol level. Reducing the risk of stroke and cardiovascular disease also helps promoting better memory. 5. Avoid stressful situations. Live a simple life and avoid aggravations. Organize your time and prepare for the next day in anticipation. 6. Sleep well, avoid any interruptions of sleep and avoid any distractions in the bedroom that may interfere with adequate sleep quality 7. Avoid sugar, avoid sweets as there is a strong link between excessive sugar intake, diabetes, and cognitive impairment We discussed the Mediterranean diet, which has been shown to help patients reduce the risk of progressive memory disorders and reduces cardiovascular risk. This includes eating fish, eat fruits and green leafy vegetables, nuts like almonds and hazelnuts, walnuts, and also use  olive oil. Avoid fast foods and fried foods as much as possible. Avoid sweets and sugar as sugar use has been linked to worsening of memory function.  There is always a concern of gradual progression of memory problems. If this is the case, then we may need to adjust level of care according to patient needs. Support, both to the patient and caregiver, should then be put into place.     FALL PRECAUTIONS: Be cautious when walking. Scan the area for obstacles that may increase the risk of trips and falls. When getting up in the mornings, sit up at the edge of the bed for a few minutes before getting out of bed. Consider elevating the bed at the head end to avoid drop of blood pressure when getting up. Walk always in a well-lit room (use night lights in the walls). Avoid area rugs or power cords from appliances in the middle of the walkways. Use a walker or a cane if necessary and consider physical therapy for balance exercise. Get your eyesight checked regularly.  FINANCIAL OVERSIGHT: Supervision, especially oversight when making financial decisions or transactions is also recommended.  HOME SAFETY: Consider the safety of the kitchen when operating appliances like stoves, microwave oven, and blender. Consider having supervision and share cooking responsibilities until no longer able to participate in those. Accidents with firearms and other hazards in the house should be identified and addressed as well.   ABILITY TO BE LEFT ALONE: If patient is unable to contact 911 operator, consider using LifeLine, or when the need is there, arrange for  someone to stay with patients. Smoking is a fire hazard, consider supervision or cessation. Risk of wandering should be assessed by caregiver and if detected at any point, supervision and safe proof recommendations should be instituted.  MEDICATION SUPERVISION: Inability to self-administer medication needs to be constantly addressed. Implement a mechanism to ensure  safe administration of the medications.   DRIVING: Regarding driving, in patients with progressive memory problems, driving will be impaired. We advise to have someone else do the driving if trouble finding directions or if minor accidents are reported. Independent driving assessment is available to determine safety of driving.   If you are interested in the driving assessment, you can contact the following:  [employer] in [place] [phone]  [facility] [phone]  [facility] [phone] or [phone]    Mediterranean Diet A Mediterranean diet refers to food and lifestyle choices that are based on the traditions of countries located on the [employer]. This way of eating has been shown to help prevent certain conditions and improve outcomes for people who have chronic diseases, like kidney disease and heart disease. What are tips for following this plan? Lifestyle  Cook and eat meals together with your family, when possible. Drink enough fluid to keep your urine clear or pale yellow. Be physically active every day. This includes: Aerobic exercise like running or swimming. Leisure activities like gardening, walking, or housework. Get 7-8 hours of sleep each night. If recommended by your health care provider, drink red wine in moderation. This means 1 glass a day for nonpregnant women and 2 glasses a day for men. A glass of wine equals 5 oz (150 mL). Reading food labels  Check the serving size of packaged foods. For foods such as rice and pasta, the serving size refers to the amount of cooked product, not dry. Check the total fat in packaged foods. Avoid foods that have saturated fat or trans fats. Check the ingredients list for added sugars, such as corn syrup. Shopping  At the grocery store, buy most of your food from the areas near the walls of the store. This includes: Fresh fruits and  vegetables (produce). Grains, beans, nuts, and seeds. Some of these may be available in unpackaged forms or large amounts (in bulk). Fresh seafood. Poultry and eggs. Low-fat dairy products. Buy whole ingredients instead of prepackaged foods. Buy fresh fruits and vegetables in-season from local farmers markets. Buy frozen fruits and vegetables in resealable bags. If you do not have access to quality fresh seafood, buy precooked frozen shrimp or canned fish, such as tuna, salmon, or sardines. Buy small amounts of raw or cooked vegetables, salads, or olives from the deli or salad bar at your store. Stock your pantry so you always have certain foods on hand, such as olive oil, canned tuna, canned tomatoes, rice, pasta, and beans. Cooking  Cook foods with extra-virgin olive oil instead of using butter or other vegetable oils. Have meat as a side dish, and have vegetables or grains as your main dish. This means having meat in small portions or adding small amounts of meat to foods like pasta or stew. Use beans or vegetables instead of meat in common dishes like chili or lasagna. Experiment with different cooking methods. Try roasting or broiling vegetables instead of steaming or sauteing them. Add frozen vegetables to soups, stews, pasta, or rice. Add nuts or seeds for added healthy fat at each meal. You can add these to yogurt, salads,  or vegetable dishes. Marinate fish or vegetables using olive oil, lemon juice, garlic, and fresh herbs. Meal planning  Plan to eat 1 vegetarian meal one day each week. Try to work up to 2 vegetarian meals, if possible. Eat seafood 2 or more times a week. Have healthy snacks readily available, such as: Vegetable sticks with hummus. Greek yogurt. Fruit and nut trail mix. Eat balanced meals throughout the week. This includes: Fruit: 2-3 servings a day Vegetables: 4-5 servings a day Low-fat dairy: 2 servings a day Fish, poultry, or lean meat: 1 serving a  day Beans and legumes: 2 or more servings a week Nuts and seeds: 1-2 servings a day Whole grains: 6-8 servings a day Extra-virgin olive oil: 3-4 servings a day Limit red meat and sweets to only a few servings a month What are my food choices? Mediterranean diet Recommended Grains: Whole-grain pasta. Brown rice. Bulgar wheat. Polenta. Couscous. Whole-wheat bread. Orpah Cobb. Vegetables: Artichokes. Beets. Broccoli. Cabbage. Carrots. Eggplant. Green beans. Chard. Kale. Spinach. Onions. Leeks. Peas. Squash. Tomatoes. Peppers. Radishes. Fruits: Apples. Apricots. Avocado. Berries. Bananas. Cherries. Dates. Figs. Grapes. Lemons. Melon. Oranges. Peaches. Plums. Pomegranate. Meats and other protein foods: Beans. Almonds. Sunflower seeds. Pine nuts. Peanuts. Cod. Salmon. Scallops. Shrimp. Tuna. Tilapia. Clams. Oysters. Eggs. Dairy: Low-fat milk. Cheese. Greek yogurt. Beverages: Water. Red wine. Herbal tea. Fats and oils: Extra virgin olive oil. Avocado oil. Grape seed oil. Sweets and desserts: [place] yogurt with honey. Baked apples. Poached pears. Trail mix. Seasoning and other foods: Basil. Cilantro. Coriander. Cumin. Mint. Parsley. Sage. Rosemary. Tarragon. Garlic. Oregano. Thyme. Pepper. Balsalmic vinegar. Tahini. Hummus. Tomato sauce. Olives. Mushrooms. Limit these Grains: Prepackaged pasta or rice dishes. Prepackaged cereal with added sugar. Vegetables: Deep fried potatoes (french fries). Fruits: Fruit canned in syrup. Meats and other protein foods: Beef. Pork. Lamb. Poultry with skin. Hot dogs. Tomasa Blase. Dairy: Ice cream. Sour cream. Whole milk. Beverages: Juice. Sugar-sweetened soft drinks. Beer. Liquor and spirits. Fats and oils: Butter. Canola oil. Vegetable oil. Beef fat (tallow). Lard. Sweets and desserts: Cookies. Cakes. Pies. Candy. Seasoning and other foods: Mayonnaise. Premade sauces and marinades. The items listed may not be a complete list. Talk with your dietitian about what  dietary choices are right for you. Summary The Mediterranean diet includes both food and lifestyle choices. Eat a variety of fresh fruits and vegetables, beans, nuts, seeds, and whole grains. Limit the amount of red meat and sweets that you eat. Talk with your health care provider about whether it is safe for you to drink red wine in moderation. This means 1 glass a day for nonpregnant women and 2 glasses a day for men. A glass of wine equals 5 oz (150 mL). This information is not intended to replace advice given to you by your health care provider. Make sure you discuss any questions you have with your health care provider. Document Released: 07/30/2016 Document Revised: 09/01/2016 Document Reviewed: 07/30/2016 Elsevier Interactive Patient Education  2017 [employer].   Your provider has requested that you have labwork completed today. Please go to [facility] ([id number]) on the second floor of this building before leaving the office today. You do not need to check in. If you are not called within 15 minutes please check with the front desk.

## 2023-11-12 NOTE — Progress Notes (Signed)
Assessment/Plan:   Mild Cognitive Impairment    Rick Melendez is a very pleasant 87 y.o. RH male with a history of hypertension, hyperlipidemia, DM2, osteoarthritis with multiple surgeries, mild anemia, hard of hearing, OSA on CPAP after weight loss, CAD, New onset of Afib, hypoproteinemia  presenting today in follow-up for evaluation of memory loss. Patient is on memantine 5 mg daily.  Since his last visit he had a CVA (see below), now on Eliquis 5 mg bid.    Recommendations:   Follow up in  6 months. Continue C PAP for OSA  Continue memantine 5 mg bid, side effects discussed  Recommend good control of cardiovascular risk factors. Follow with Cards  Use the hearing aids Continue to control mood as per PCP  Small R paramedian pons acute infarct  Patient presented to the ED on 11/01/23  from his PCP office. On 11/9 he had an episode of dizziness,vertigo and BLE  weakness, resulting in a fall. He had taken meclizine for vertigo with god results, but the weakness lasted till being seen, did not seek immediate medical attention. He had dysarthria as well which improved at the ER. MRI brain showed small R paramedian pons acute infarct. Neurology consult was obtained. Full workup proceeded. EKG with Afib. Carotid doppler did not show any significant stenosis.. Echocardiogram showed EF 60 to 65%, no intracardiac source of embolism. A1c 6.9, HDL 26, TG 183, LDL 26.  Home health PT recommended.Crestor 40 mg daily Given new onset A-fib, patient was switched from ASA And Plavix to Eliquis.   Continue Eliquis 5 mg bid Continue Home PT Continue secondary stroke prevention. Follow with Cardiology  Subjective:   This patient is accompanied in the office by his wife  who supplements the history. Previous records as well as any outside records available were reviewed prior to todays visit.   Patient was last seen on 07/16/23 with MoCA 27/30     Any changes in memory since last visit? " About the  same". "I don't see any memory or speech change".  Does not like any brain exercises. Enjoys going to [employer] oneself?  Endorsed Disoriented when walking into a room?  Patient denies   Misplacing objects?  Patient denies   Wandering behavior?   denies   Any personality changes since last visit?   denies   Any worsening depression?: denies   Hallucinations or paranoia?  denies   Seizures?   denies    Any sleep changes?  Does not sleep very well because he sleeps during the day, wife says. Denies vivid dreams, REM behavior or sleepwalking   Sleep apnea?  Endorsed, does not like wearing his CPAP  Any hygiene concerns?   denies   Independent of bathing and dressing?  Endorsed  Does the patient needs help with medications? Wife in charge   Who is in charge of the finances?  Wife has always been in charge   Any changes in appetite?  denies     Patient have trouble swallowing?  Denies.   Does the patient cook?  Any kitchen accidents such as leaving the stove on?   denies   Any headaches?    Denies.   Vision changes? Denies. Chronic pain?  Endorsed, chronic back pain due to advanced multilevel DJD and severe L spine stenosis, receives back injections Ambulates with difficulty?  Uses a walker now for stability    Recent falls or head injuries? As mentioned above,  on 11/9 he fell on  his chest, did not lose consciousness. No Head injury    Unilateral weakness, numbness or tingling?   denies   Any tremors?  denies   Any anosmia?    denies   Any incontinence of urine?  Endorsed, urge incontinence Any bowel dysfunction?  Denies.      Patient lives with wife  Does the patient drive? Yes, denies getting lost    Initial evaluation 10/02/2022  How long did patient have memory difficulties? "I didn't notice anything".  Wife "is not sure that he has significant memory changes, the doctor sent me here".  Disoriented when walking into a room?  Patient denies   Leaving objects in unusual places?   Patient denies   Patient lives wife Ambulates  with difficulty? Uses a walker and a cane for stability due to arthritis  Recent falls?  Fell at the end of July and uses a walker. ONnPT/OT weekly  Any head injuries?  Patient denies   History of seizures?   Patient denies   Wandering behavior?  Patient denies   Patient drives? No issues driving  Any mood changes ?  Denies  Any depression?:  Patient denies  "I like me too good!" Hallucinations?  Patient denies now, but when" I was on Ozempic he did, once off I was fine". Paranoia?  Patient denies   Patient reports that sleeps well without vivid dreams, REM behavior or sleepwalking    History of sleep apnea? Endorsed, but after 40 lb weight loss "I no longer need CPAP"" Any hygiene concerns?  Patient denies   Independent of bathing and dressing?  Endorsed  Does the patient needs help with medications? He is in charge  Who is in charge of the finances? Wife has always been is in charge   Any changes in appetite?  Patient denies   Patient have trouble swallowing? Patient denies   Does the patient cook?  Patient denies   Any kitchen accidents such as leaving the stove on? Patient denies   Any headaches?  Patient denies   Double vision? Patient denies   Any focal numbness or tingling?  Patient denies   Chronic back pain: Endorsed, s/p falls and several surgeries shoulder, C spine Unilateral weakness?  Patient denies   Any tremors?  Patient denies   Any history of anosmia?  Patient denies   Any incontinence of urine?  Patient denies   Any bowel dysfunction?   Patient denies   History of heavy alcohol intake?  Patient denies   History of heavy tobacco use?  Patient denies   Family history of dementia?   Denies    Retired plumber   MRI brain report 09/22/22 There is mild diffuse cerebral atrophy. No hippocampal atrophy.     Labs 10/02/2022 TSH 1.93 MRI brain personally reviewed 11/02/23 small acute R paramedian pons infarct, mild diffuse  cerebral atrophy   Past Medical History:  Diagnosis Date   Arthritis    Coronary artery disease 2007   cabg x3   Diabetes mellitus without complication (HCC)    Hyperlipidemia    Hypertension    Influenza B 03/19/2017   OSA on CPAP    Dr Tresa Endo - follows and treatmentof sleep apnea   PVD (peripheral vascular disease) (HCC) 2003   DR BERRY-  -LEFT LEG STENTING     Past Surgical History:  Procedure Laterality Date   CARDIAC CATHETERIZATION  0911/2007   3 vessel CAD WITH LEFT MAIN CAD SEVERE ,norm lLIMA,LV nom. ,evidence for very  mild aortic stenosis with gradient, mild stenosis distal abdnminal aotra and proximal left common iliac artery 30%   CORONARY ARTERY BYPASS GRAFT  08/31/2006   DR GERHARDT---LIMA to LAD, VEIN TO AN OBTUSE MARGINAL BRANCH AND  RIGHT CORONARY ARTERY   CPET  04/13/2012   normal   DOPPLER ECHOCARDIOGRAPHY  07/08/2011   EF =>55%   lower arterial doppler  07/01/2012   mildly abnormal lower extremity;ABIs >1 bilaterally with moderately high-grade right iliac stenosis that had not changed from proir study.   NM MYOCAR PERF WALL MOTION  7/182012   EF 59%, normal myocardial perfusio     PREVIOUS MEDICATIONS:   CURRENT MEDICATIONS:  Outpatient Encounter Medications as of 11/12/2023  Medication Sig   albuterol (PROVENTIL HFA) 108 (90 Base) MCG/ACT inhaler Inhale 2 puffs into the lungs every 4 (four) hours as needed for wheezing or shortness of breath.   apixaban (ELIQUIS) 5 MG TABS tablet Take 1 tablet (5 mg total) by mouth 2 (two) times daily.   Cyanocobalamin (B-12 PO) Take 1 tablet by mouth daily.   cyclobenzaprine (FLEXERIL) 5 MG tablet 1 tablet at bedtime as needed Orally Once a day for 30 days   ezetimibe (ZETIA) 10 MG tablet Take 10 mg by mouth daily.   famotidine (PEPCID) 20 MG tablet Take 20 mg by mouth 2 (two) times daily.   FARXIGA 10 MG TABS tablet Take 10 mg by mouth daily.   furosemide (LASIX) 20 MG tablet Take 20 mg by mouth daily.   ipratropium  (ATROVENT) 0.06 % nasal spray Place 1 spray into the nose 3 (three) times daily as needed for rhinitis.   KERENDIA 10 MG TABS Take 10 mg by mouth daily.   meclizine (ANTIVERT) 25 MG tablet Take 25 mg by mouth 3 (three) times daily as needed for dizziness.   metoprolol tartrate (LOPRESSOR) 25 MG tablet Take 12.5 mg by mouth daily.    OVER THE COUNTER MEDICATION USES CPAP NIGHTLY   rosuvastatin (CRESTOR) 40 MG tablet Take 40 mg by mouth daily.   traMADol (ULTRAM) 50 MG tablet Take 50-100 mg by mouth See admin instructions. Take 50mg  (1 tablet) by mouth two to three times a day.   [DISCONTINUED] memantine (NAMENDA) 5 MG tablet Take 1 tablet  twice a day   memantine (NAMENDA) 5 MG tablet Take 1 tablet  twice a day   No facility-administered encounter medications on file as of 11/12/2023.     Objective:     PHYSICAL EXAMINATION:    VITALS:   Vitals:   11/12/23 0856  BP: 100/60  Pulse: 77  Resp: 18  SpO2: 98%  Weight: 186 lb (84.4 kg)  Height: 5\' 7"  (1.702 m)    GEN:  The patient appears stated age and is in NAD. HEENT:  Normocephalic, atraumatic.   Neurological examination:  General: NAD, well-groomed, appears stated age. Orientation: The patient is alert. Oriented to person, place and date Cranial nerves: There is mild R facial droop .The speech is fluent and clear. No aphasia or dysarthria. Fund of knowledge is appropriate. Recent memory impaired and remote memory is normal.  Attention and concentration are normal.  Able to name objects and repeat phrases.  Hearing is decreased to conversational tone, forgot his hearing aids.   Delayed recall 2/3 Sensation: Sensation is intact to light touch throughout Motor: Strength is at least antigravity x4. DTR's 2/4 in UE/LE      10/02/2022   10:00 AM  Montreal Cognitive Assessment   Visuospatial/  Executive (0/5) 1  Naming (0/3) 3  Attention: Read list of digits (0/2) 2  Attention: Read list of letters (0/1) 1  Attention: Serial 7  subtraction starting at 100 (0/3) 3  Language: Repeat phrase (0/2) 1  Language : Fluency (0/1) 0  Abstraction (0/2) 0  Delayed Recall (0/5) 4  Orientation (0/6) 5  Total 20  Adjusted Score (based on education) 20       11/12/2023   12:00 PM 07/16/2023   11:00 AM  MMSE - Mini Mental State Exam  Orientation to time 5 4  Orientation to Place 5 5  Registration 3 3  Attention/ Calculation 5 5  Recall 2 2  Language- name 2 objects 2 2  Language- repeat 1 1  Language- follow 3 step command 3 3  Language- read & follow direction 1 1  Write a sentence 1 1  Copy design 1 0  Total score 29 27       Movement examination: Tone: There is normal tone in the UE/LE Abnormal movements:  no tremor.  No myoclonus.  No asterixis.   Coordination:  There is no decremation with RAM's. Normal finger to nose  Gait and Station: The patient has no difficulty arising out of a deep-seated chair without the use of the hands. The patient's stride length is good.  Gait is cautious and narrow.   Thank you for allowing [place] the opportunity to participate in the care of this nice patient. Please do not hesitate to contact us for any questions or concerns.   Total time spent on today's visit was 53 minutes dedicated to this patient today, preparing to see patient, examining the patient, ordering tests and/or medications and counseling the patient, documenting clinical information in the EHR or other health record, independently interpreting results and communicating results to the patient/family, discussing treatment and goals, answering patient's questions and coordinating care.  Cc:  Merri Brunette, MD  Marlowe Kays 11/12/2023 12:19 PM

## 2023-11-15 ENCOUNTER — Encounter: Payer: Self-pay | Admitting: Nurse Practitioner

## 2023-11-15 ENCOUNTER — Ambulatory Visit: Payer: Medicare Other | Attending: Nurse Practitioner | Admitting: Emergency Medicine

## 2023-11-15 VITALS — BP 110/60 | HR 83 | Ht 66.0 in | Wt 186.0 lb

## 2023-11-15 DIAGNOSIS — I4819 Other persistent atrial fibrillation: Secondary | ICD-10-CM

## 2023-11-15 DIAGNOSIS — I639 Cerebral infarction, unspecified: Secondary | ICD-10-CM | POA: Diagnosis not present

## 2023-11-15 DIAGNOSIS — I2510 Atherosclerotic heart disease of native coronary artery without angina pectoris: Secondary | ICD-10-CM

## 2023-11-15 DIAGNOSIS — E785 Hyperlipidemia, unspecified: Secondary | ICD-10-CM | POA: Diagnosis not present

## 2023-11-15 DIAGNOSIS — I69322 Dysarthria following cerebral infarction: Secondary | ICD-10-CM | POA: Diagnosis not present

## 2023-11-15 DIAGNOSIS — G4733 Obstructive sleep apnea (adult) (pediatric): Secondary | ICD-10-CM

## 2023-11-15 DIAGNOSIS — E1151 Type 2 diabetes mellitus with diabetic peripheral angiopathy without gangrene: Secondary | ICD-10-CM | POA: Diagnosis not present

## 2023-11-15 DIAGNOSIS — I10 Essential (primary) hypertension: Secondary | ICD-10-CM

## 2023-11-15 DIAGNOSIS — I69354 Hemiplegia and hemiparesis following cerebral infarction affecting left non-dominant side: Secondary | ICD-10-CM | POA: Diagnosis not present

## 2023-11-15 MED ORDER — METOPROLOL TARTRATE 25 MG PO TABS: 12.5000 mg | ORAL_TABLET | Freq: Two times a day (BID) | ORAL | 3 refills | Status: DC

## 2023-11-15 NOTE — Patient Instructions (Addendum)
Medication Instructions:  Metoprolol Tartrate 12.5 mg twice daily            *If you need a refill on your cardiac medications before your next appointment, please call your pharmacy*   Lab Work: NONE ordered at this time of appointment    Testing/Procedures: NONE ordered at this time of appointment   Follow-Up: At [facility], you and your health needs are our priority.  As part of our continuing mission to provide you with exceptional heart care, we have created designated Provider Care Teams.  These Care Teams include your primary Cardiologist (physician) and Advanced Practice Providers (APPs -  Physician Assistants and Nurse Practitioners) who all work together to provide you with the care you need, when you need it.  We recommend signing up for the patient portal called "MyChart".  Sign up information is provided on this After Visit Summary.  MyChart is used to connect with patients for Virtual Visits (Telemedicine).  Patients are able to view lab/test results, encounter notes, upcoming appointments, etc.  Non-urgent messages can be sent to your provider as well.   To learn more about what you can do with MyChart, go to [email or web address].    Your next appointment:   We will refer you to our Afib Clinic  3 months   Provider:   Rise Paganini NP or Any APP   Other Instructions

## 2023-11-15 NOTE — Progress Notes (Signed)
Cardiology Office Note:    Date:  11/16/2023  ID:  Rick Melendez, DOB [birth year], MRN [id number] PCP: Merri Brunette, MD  [facility] Providers Cardiologist:  Nanetta Batty, MD       Patient Profile:      Rick Melendez is a 87 year old with history of CAD s/p CABG 2007, PAD s/p left common iliac artery stent 2003, hypertension, aortic root dilation, hyperlipidemia, T2DM, OSA, PVD, CVA in 2024, atrial fibrillation.   Myoview performed 07/06/2013 and was nonischemic. In September 2017 he underwent coronary artery bypass grafting x2 with LIMA to his LAD, vein to obtuse marginal branch.  He had left common iliac artery stenting to his left leg in 2003, Doppler in July 2013 showed right iliac disease.  Most recent VAS [place] ABI on 07/05/2023 with R/L ankle brachial index within normal range.  Most recent VAS US aorta on 07/05/2023 showing patent IVC.  History of OSA but noncompliant with CPAP.  Patient most recently admitted to hospital on 11/01/2023, discharged 11/03/2023 with CVA.  He presented to ED on 11/11 with complaint of weakness, dizziness for several days.  On admission he had persistent left-sided weakness, dysarthria.  He noted starting to feel dizzy the morning of 11/9.  Initial CT head did not show any acute intracranial abnormalities, MRI brain showed small acute infarct in the right paramedian pons.  EKG showed new onset A-fib at rate of 72 bpm, as this was probable cause for his stroke.  Carotid duplex completed did not show any significant stenosis.  Echocardiogram showed EF 60 to 65%, no RWMA, moderate LVH, RV SF normal, severe mitral annular calcification, mild dilation of aortic root measuring 43 mm.  No intracardiac source of embolism.  He was instructed to stop his aspirin and Plavix and switch to Eliquis 5 mg twice daily.  He was continued on his same dose of metoprolol tartrate 12.5 mg once daily.      History of Present Illness:  Discussed the use of AI scribe software for  clinical note transcription with the patient, who gave verbal consent to proceed.  Rick Melendez is a 87 y.o. male who returns for hospital follow up for new onset atrial fibrillation and CVA.  Today, he notes that he is doing well overall.  His biggest complaints today is ongoing fatigue, malaise, and generalized weakness.  He noted this started several days prior to his admission.  He notes that he does not know when he is in A-fib because he does not experience any palpitations, irregular heartbeats, shortness of breath.  He notes that he used to be able to work out in his yard and cleaning his boat however due to his fatigue he is too wiped out to do much activity.  He does note recent noncompliance with his CPAP due to the machine not working appropriately.  He notes that he has been off of his CPAP for several months but plans to contact the distributor for a new/replacement CPAP this week.  He has been compliant with his new medication regimen including his Eliquis.  He denies any chest pain, exertional angina, DOE, orthopnea, syncope, lightheadedness, dizziness.          Review of Systems  Constitutional: Negative for weight gain and weight loss.  Cardiovascular:  Negative for chest pain, dyspnea on exertion, irregular heartbeat, leg swelling, near-syncope, orthopnea, palpitations, paroxysmal nocturnal dyspnea and syncope.  Respiratory:  Negative for cough, hemoptysis and shortness of breath.   Gastrointestinal:  Negative for  abdominal pain, hematochezia and melena.  Genitourinary:  Negative for hematuria.  Neurological:  Positive for weakness.     See HPI     Studies Reviewed:   EKG Interpretation Date/Time:  Monday November 15 2023 14:11:14 EST Ventricular Rate:  83 PR Interval:    QRS Duration:  108 QT Interval:  376 QTC Calculation: 441 R Axis:   60  Text Interpretation: Atrial fibrillation Confirmed by Rise Paganini [phone]) on 11/15/2023 3:37:08 PM    Echocardiogram  11/02/2023 1. Left ventricular ejection fraction, by estimation, is 60 to 65%. The  left ventricle has normal function. The left ventricle has no regional  wall motion abnormalities. There is moderate concentric left ventricular  hypertrophy. Left ventricular  diastolic function could not be evaluated.   2. Right ventricular systolic function is normal. The right ventricular  size is normal. Tricuspid regurgitation signal is inadequate for assessing  PA pressure.   3. The mitral valve is degenerative. No evidence of mitral valve  regurgitation. No evidence of mitral stenosis. Severe mitral annular  calcification.   4. The aortic valve was not well visualized. Aortic valve regurgitation  is not visualized. Aortic valve area, by VTI measures 1.22 cm. Aortic  valve mean gradient measures 10.8 mmHg. Aortic valve Vmax measures 2.10  m/s.   5. Aortic dilatation noted. There is mild dilatation of the aortic root,  measuring 43 mm.   6. The inferior vena cava is normal in size with greater than 50%  respiratory variability, suggesting right atrial pressure of 3 mmHg.   7. Agitated saline contrast bubble study was negative, with no evidence  of any interatrial shunt.   VAS US carotid 11/02/2023 Right Carotid: Velocities in the right ICA are consistent with a 1-39%  stenosis.  Left Carotid: Velocities in the left ICA are consistent with a 1-39%  stenosis.  Risk Assessment/Calculations:    CHA2DS2-VASc Score = 7   This indicates a 11.2% annual risk of stroke. The patient's score is based upon: CHF History: 0 HTN History: 1 Diabetes History: 1 Stroke History: 2 Vascular Disease History: 1 Age Score: 2 Gender Score: 0            Physical Exam:   VS:  BP 110/60   Pulse 83   Ht 5\' 6"  (1.676 m)   Wt 186 lb (84.4 kg)   SpO2 96%   BMI 30.02 kg/m    Wt Readings from Last 3 Encounters:  11/15/23 186 lb (84.4 kg)  11/12/23 186 lb (84.4 kg)  11/02/23 191 lb 9.3 oz (86.9 kg)     Constitutional:      Appearance: Normal and healthy appearance.  Neck:     Vascular: JVD normal.  Pulmonary:     Effort: Pulmonary effort is normal.     Breath sounds: Normal breath sounds.  Chest:     Chest wall: Not tender to palpatation.  Cardiovascular:     PMI at left midclavicular line. Normal rate. Irregularly irregular rhythm. Normal S1. Normal S2.      Murmurs: There is no murmur.     No gallop.  No click. No rub.  Pulses:    Intact distal pulses.  Edema:    Peripheral edema absent.  Musculoskeletal:     Cervical back: Neck supple. Skin:    General: Skin is warm.  Neurological:     General: No focal deficit present.     Mental Status: Oriented to person, place and time.  Psychiatric:  Behavior: Behavior is cooperative.        Assessment and Plan:  Persistent atrial fibrillation -CHA2DS2-VASc Score = 7, 11.2% annual risk of stroke -EKG today showing rate controlled atrial fibrillation -He notes ongoing fatigue, generalized weakness, malaise. A-fib could be likely contributor -We discussed his different options including cardioversion, antiarrhythmics, and chronic on-going atrial fibrillation -Plan will be to refer to A-fib clinic for for ongoing management per Dr. Allyson Sabal (addended 11/16/2023) -Continue Eliquis 5 mg twice daily does not meet dose reduction qualifications -Plan to switch metoprolol tartrate 12.5 mg once daily to twice daily  Coronary artery disease -S/p CABG x 2 in 2007 (LIMA-LAD, SVG-OM) -Low risk Myoview on in 2012, nonischemic Myoview performed 07/06/2013 -No chest pain, no exertional angina, no need for ischemic evaluation -Not on antiplatelet therapy given OAC -Continue ezetimibe 10 mg, metoprolol 12.5 mg twice daily, rosuvastatin 40 mg  Obstructive sleep apnea -No longer wearing CPAP due to malfunctioning machine -Discussed importance of compliance and reaching out to manufacturer for new machine -He is at high risk of recurrent A-fib  given uncontrolled OSA  Hypertension -BP 110/60, well-controlled -He will monitor at home due to switching metoprolol tartrate to 12.5 mg twice daily  CVA -Continue to follow with neurology                 Dispo:  Return in about 3 months (around 02/15/2024).  Signed, Denyce Robert, NP

## 2023-11-16 ENCOUNTER — Other Ambulatory Visit: Payer: Self-pay

## 2023-11-16 DIAGNOSIS — I4819 Other persistent atrial fibrillation: Secondary | ICD-10-CM

## 2023-11-16 NOTE — Addendum Note (Signed)
Addended by: Marlowe Kays E on: 11/16/2023 05:29 PM   Modules accepted: Level of Service

## 2023-11-17 DIAGNOSIS — I69322 Dysarthria following cerebral infarction: Secondary | ICD-10-CM | POA: Diagnosis not present

## 2023-11-17 DIAGNOSIS — E1151 Type 2 diabetes mellitus with diabetic peripheral angiopathy without gangrene: Secondary | ICD-10-CM | POA: Diagnosis not present

## 2023-11-17 DIAGNOSIS — E785 Hyperlipidemia, unspecified: Secondary | ICD-10-CM | POA: Diagnosis not present

## 2023-11-17 DIAGNOSIS — R0989 Other specified symptoms and signs involving the circulatory and respiratory systems: Secondary | ICD-10-CM | POA: Diagnosis not present

## 2023-11-17 DIAGNOSIS — I69354 Hemiplegia and hemiparesis following cerebral infarction affecting left non-dominant side: Secondary | ICD-10-CM | POA: Diagnosis not present

## 2023-11-17 DIAGNOSIS — I10 Essential (primary) hypertension: Secondary | ICD-10-CM | POA: Diagnosis not present

## 2023-11-24 DIAGNOSIS — I4891 Unspecified atrial fibrillation: Secondary | ICD-10-CM | POA: Diagnosis not present

## 2023-11-24 DIAGNOSIS — I10 Essential (primary) hypertension: Secondary | ICD-10-CM | POA: Diagnosis not present

## 2023-11-24 DIAGNOSIS — E7800 Pure hypercholesterolemia, unspecified: Secondary | ICD-10-CM | POA: Diagnosis not present

## 2023-11-24 DIAGNOSIS — N1831 Chronic kidney disease, stage 3a: Secondary | ICD-10-CM | POA: Diagnosis not present

## 2023-11-24 DIAGNOSIS — E1169 Type 2 diabetes mellitus with other specified complication: Secondary | ICD-10-CM | POA: Diagnosis not present

## 2023-11-25 DIAGNOSIS — I10 Essential (primary) hypertension: Secondary | ICD-10-CM | POA: Diagnosis not present

## 2023-11-25 DIAGNOSIS — E785 Hyperlipidemia, unspecified: Secondary | ICD-10-CM | POA: Diagnosis not present

## 2023-11-25 DIAGNOSIS — E1151 Type 2 diabetes mellitus with diabetic peripheral angiopathy without gangrene: Secondary | ICD-10-CM | POA: Diagnosis not present

## 2023-11-25 DIAGNOSIS — I69322 Dysarthria following cerebral infarction: Secondary | ICD-10-CM | POA: Diagnosis not present

## 2023-11-25 DIAGNOSIS — I69354 Hemiplegia and hemiparesis following cerebral infarction affecting left non-dominant side: Secondary | ICD-10-CM | POA: Diagnosis not present

## 2023-11-30 DIAGNOSIS — E1151 Type 2 diabetes mellitus with diabetic peripheral angiopathy without gangrene: Secondary | ICD-10-CM | POA: Diagnosis not present

## 2023-11-30 DIAGNOSIS — I69354 Hemiplegia and hemiparesis following cerebral infarction affecting left non-dominant side: Secondary | ICD-10-CM | POA: Diagnosis not present

## 2023-11-30 DIAGNOSIS — I69322 Dysarthria following cerebral infarction: Secondary | ICD-10-CM | POA: Diagnosis not present

## 2023-11-30 DIAGNOSIS — E785 Hyperlipidemia, unspecified: Secondary | ICD-10-CM | POA: Diagnosis not present

## 2023-11-30 DIAGNOSIS — I10 Essential (primary) hypertension: Secondary | ICD-10-CM | POA: Diagnosis not present

## 2023-12-02 ENCOUNTER — Ambulatory Visit (HOSPITAL_COMMUNITY)
Admission: RE | Admit: 2023-12-02 | Discharge: 2023-12-02 | Disposition: A | Discharge status: Home / Self Care | Payer: Medicare Other | Source: Ambulatory Visit | Attending: Physician Assistant | Admitting: Physician Assistant

## 2023-12-02 VITALS — BP 120/72 | HR 80 | Ht 66.0 in | Wt 189.6 lb

## 2023-12-02 DIAGNOSIS — Z951 Presence of aortocoronary bypass graft: Secondary | ICD-10-CM | POA: Diagnosis not present

## 2023-12-02 DIAGNOSIS — I4819 Other persistent atrial fibrillation: Secondary | ICD-10-CM | POA: Diagnosis not present

## 2023-12-02 DIAGNOSIS — I10 Essential (primary) hypertension: Secondary | ICD-10-CM | POA: Diagnosis not present

## 2023-12-02 DIAGNOSIS — I2510 Atherosclerotic heart disease of native coronary artery without angina pectoris: Secondary | ICD-10-CM | POA: Diagnosis not present

## 2023-12-02 DIAGNOSIS — E1151 Type 2 diabetes mellitus with diabetic peripheral angiopathy without gangrene: Secondary | ICD-10-CM | POA: Insufficient documentation

## 2023-12-02 DIAGNOSIS — Z7901 Long term (current) use of anticoagulants: Secondary | ICD-10-CM | POA: Insufficient documentation

## 2023-12-02 DIAGNOSIS — Z79899 Other long term (current) drug therapy: Secondary | ICD-10-CM | POA: Diagnosis not present

## 2023-12-02 DIAGNOSIS — G4733 Obstructive sleep apnea (adult) (pediatric): Secondary | ICD-10-CM | POA: Insufficient documentation

## 2023-12-02 DIAGNOSIS — Z7984 Long term (current) use of oral hypoglycemic drugs: Secondary | ICD-10-CM | POA: Diagnosis not present

## 2023-12-02 DIAGNOSIS — D6869 Other thrombophilia: Secondary | ICD-10-CM | POA: Diagnosis not present

## 2023-12-02 DIAGNOSIS — E785 Hyperlipidemia, unspecified: Secondary | ICD-10-CM | POA: Insufficient documentation

## 2023-12-02 LAB — BASIC METABOLIC PANEL
Anion gap: 7 (ref 5–15)
BUN: 23 mg/dL (ref 8–23)
CO2: 26 mmol/L (ref 22–32)
Calcium: 9 mg/dL (ref 8.9–10.3)
Chloride: 103 mmol/L (ref 98–111)
Creatinine, Ser: 1.4 mg/dL — ABNORMAL HIGH (ref 0.61–1.24)
GFR, Estimated: 49 mL/min — ABNORMAL LOW (ref >60)
Glucose, Bld: 109 mg/dL — ABNORMAL HIGH (ref 70–99)
Potassium: 5.1 mmol/L (ref 3.5–5.1)
Sodium: 136 mmol/L (ref 135–145)

## 2023-12-02 LAB — CBC
HCT: 47.9 % (ref 39.0–52.0)
Hemoglobin: 14.1 g/dL (ref 13.0–17.0)
MCH: 24.6 pg — ABNORMAL LOW (ref 26.0–34.0)
MCHC: 29.4 g/dL — ABNORMAL LOW (ref 30.0–36.0)
MCV: 83.6 fL (ref 80.0–100.0)
Platelets: 315 10*3/uL (ref 150–400)
RBC: 5.73 MIL/uL (ref 4.22–5.81)
RDW: 16 % — ABNORMAL HIGH (ref 11.5–15.5)
WBC: 10.2 10*3/uL (ref 4.0–10.5)
nRBC: 0 % (ref 0.0–0.2)

## 2023-12-02 NOTE — Patient Instructions (Signed)
Cardioversion scheduled for: Monday, December 16th   - Arrive at the [facility] and go to admitting at 830am   - Do not eat or drink anything after midnight the night prior to your procedure.   - Take all your morning medication (except diabetic medications) with a sip of water prior to arrival.  - You will not be able to drive home after your procedure.    - Do NOT miss any doses of your blood thinner - if you should miss a dose please notify our office immediately.   - If you feel as if you go back into normal rhythm prior to scheduled cardioversion, please notify our office immediately.   If your procedure is canceled in the cardioversion suite you will be charged a cancellation fee.    Hold below medications 72 hours prior to scheduled procedure/anesthesia. Restart medication on the following day after scheduled procedure/anesthesia  Dapagliflozin Marcelline Deist)    For those patients who have a scheduled procedure/anesthesia on the same day of the week as their dose, hold the medication on the day of surgery.  They can take their scheduled dose the week before.  **Patients on the above medications scheduled for elective procedures that have not held the medication for the appropriate amount of time are at risk of cancellation or change in the anesthetic plan.

## 2023-12-02 NOTE — Progress Notes (Signed)
Primary Care Physician: Merri Brunette, MD Primary Cardiologist: Nanetta Batty, MD Electrophysiologist: None  Referring Physician: Rise Paganini NP/Dr Irving Burton Rick Melendez is a 87 y.o. male with a history of DM2, HTN, HLD, OSA on CPAP, CAD s/p CABG 2007, PAD s/p left leg stenting, osteoarthritis, CVA, atrial fibrillation who presents for follow up in the [facility].  The patient was initially diagnosed with atrial fibrillation 11/02/23 after presenting to the ED with symptoms of dizziness and weakness. Brain MRI showed a small acute infarct in the right paramedian pons. ECG showed rate controlled afib. Patient was started on Eliquis for a CHADS2VASC score of 7. Seen by Rise Paganini 11/15/23 and remained in afib at that time.  On follow up today, patient reports that he feels well but his energy level is not back to his baseline. He has no cardiac awareness of his arrhythmia. No bleeding issues on anticoagulation.   Today, he denies symptoms of palpitations, chest pain, shortness of breath, orthopnea, PND, lower extremity edema, dizziness, presyncope, syncope, snoring, daytime somnolence, bleeding, or neurologic sequela. The patient is tolerating medications without difficulties and is otherwise without complaint today.    Atrial Fibrillation Risk Factors:  he does have symptoms or diagnosis of sleep apnea. he does not have a history of rheumatic fever. The patient does not have a history of early familial atrial fibrillation or other arrhythmias.  Atrial Fibrillation Management history:  Previous antiarrhythmic drugs: none Previous cardioversions: none Previous ablations: none Anticoagulation history: Eliquis  ROS- All systems are reviewed and negative except as per the HPI above.  Past Medical History:  Diagnosis Date   Arthritis    Coronary artery disease 2007   cabg x3   Diabetes mellitus without complication (HCC)    Hyperlipidemia     Hypertension    Influenza B 03/19/2017   OSA on CPAP    Dr Tresa Endo - follows and treatmentof sleep apnea   PVD (peripheral vascular disease) (HCC) 2003   DR BERRY-  -LEFT LEG STENTING    Current Outpatient Medications  Medication Sig Dispense Refill   albuterol (PROVENTIL HFA) 108 (90 Base) MCG/ACT inhaler Inhale 2 puffs into the lungs every 4 (four) hours as needed for wheezing or shortness of breath.     apixaban (ELIQUIS) 5 MG TABS tablet Take 1 tablet (5 mg total) by mouth 2 (two) times daily. 180 tablet 0   Cyanocobalamin (B-12 PO) Take 1 tablet by mouth daily.     cyclobenzaprine (FLEXERIL) 5 MG tablet 1 tablet at bedtime as needed Orally Once a day for 30 days     ezetimibe (ZETIA) 10 MG tablet Take 10 mg by mouth daily.     famotidine (PEPCID) 20 MG tablet Take 20 mg by mouth 2 (two) times daily.     FARXIGA 10 MG TABS tablet Take 10 mg by mouth daily.     furosemide (LASIX) 20 MG tablet Take 20 mg by mouth daily.  5   ipratropium (ATROVENT) 0.06 % nasal spray Place 1 spray into the nose 3 (three) times daily as needed for rhinitis.     KERENDIA 10 MG TABS Take 10 mg by mouth daily.     meclizine (ANTIVERT) 25 MG tablet Take 25 mg by mouth 3 (three) times daily as needed for dizziness.     memantine (NAMENDA) 5 MG tablet Take 1 tablet  twice a day 180 tablet 3   metoprolol tartrate (LOPRESSOR) 25 MG tablet Take  0.5 tablets (12.5 mg total) by mouth 2 (two) times daily. 90 tablet 3   OVER THE COUNTER MEDICATION USES CPAP NIGHTLY     rosuvastatin (CRESTOR) 40 MG tablet Take 40 mg by mouth daily.     traMADol (ULTRAM) 50 MG tablet Take 50-100 mg by mouth See admin instructions. Take 50mg  (1 tablet) by mouth two to three times a day.     No current facility-administered medications for this encounter.    Physical Exam: BP 120/72   Pulse 80   Ht 5\' 6"  (1.676 m)   Wt 86 kg   BMI 30.60 kg/m   GEN: Well nourished, well developed in no acute distress NECK: No JVD; No carotid  bruits CARDIAC: Irregularly irregular rate and rhythm, no murmurs, rubs, gallops RESPIRATORY:  Clear to auscultation without rales, wheezing or rhonchi  ABDOMEN: Soft, non-tender, non-distended EXTREMITIES:  No edema; No deformity   Wt Readings from Last 3 Encounters:  12/02/23 86 kg  11/15/23 84.4 kg  11/12/23 84.4 kg     EKG today demonstrates  Afib Vent. rate 80 BPM PR interval * ms QRS duration 106 ms QT/QTcB 396/456 ms   Echo 11/02/23 demonstrated   1. Left ventricular ejection fraction, by estimation, is 60 to 65%. The  left ventricle has normal function. The left ventricle has no regional  wall motion abnormalities. There is moderate concentric left ventricular  hypertrophy. Left ventricular diastolic function could not be evaluated.   2. Right ventricular systolic function is normal. The right ventricular  size is normal. Tricuspid regurgitation signal is inadequate for assessing  PA pressure.   3. The mitral valve is degenerative. No evidence of mitral valve  regurgitation. No evidence of mitral stenosis. Severe mitral annular  calcification.   4. The aortic valve was not well visualized. Aortic valve regurgitation  is not visualized. Aortic valve area, by VTI measures 1.22 cm. Aortic  valve mean gradient measures 10.8 mmHg. Aortic valve Vmax measures 2.10  m/s.   5. Aortic dilatation noted. There is mild dilatation of the aortic root,  measuring 43 mm.   6. The inferior vena cava is normal in size with greater than 50%  respiratory variability, suggesting right atrial pressure of 3 mmHg.   7. Agitated saline contrast bubble study was negative, with no evidence  of any interatrial shunt.   Conclusion(s)/Recommendation(s): No intracardiac source of embolism  detected on this transthoracic study. Consider a transesophageal  echocardiogram to exclude cardiac source of embolism if clinically  indicated.    CHA2DS2-VASc Score = 7  The patient's score is based  upon: CHF History: 0 HTN History: 1 Diabetes History: 1 Stroke History: 2 Vascular Disease History: 1 Age Score: 2 Gender Score: 0       ASSESSMENT AND PLAN: Persistent Atrial Fibrillation (ICD10:  I48.19) The patient's CHA2DS2-VASc score is 7, indicating a 11.2% annual risk of stroke.   Patient remains in rate controlled afib. We discussed rate vs rhythm control. Patient would like a trial of SR to see if his fatigue improves which I think is reasonable. Will arrange for DCCV, he is 4 weeks post Eliquis start. Check cbc/bmet today Continue Eliquis 5 mg BID Continue Lopressor 12.5 mg BID  Secondary Hypercoagulable State (ICD10:  D68.69) The patient is at significant risk for stroke/thromboembolism based upon his CHA2DS2-VASc Score of 7.  Continue Apixaban (Eliquis).   HTN Stable on current regimen  OSA  Encouraged nightly CPAP The importance of adequate treatment of sleep apnea was  discussed today in order to improve our ability to maintain sinus rhythm long term.  CAD S/p CABG 2007 No anginal symptoms  On statin   Follow up in the AF clinic post DCCV.   Informed Consent   Shared Decision Making/Informed Consent The risks (stroke, cardiac arrhythmias rarely resulting in the need for a temporary or permanent pacemaker, skin irritation or burns and complications associated with conscious sedation including aspiration, arrhythmia, respiratory failure and death), benefits (restoration of normal sinus rhythm) and alternatives of a direct current cardioversion were explained in detail to Mr. Schooler and he agrees to proceed.         Jorja Loa PA-C Afib Clinic [facility] [address] [place], [place] [address] [phone]

## 2023-12-03 NOTE — Progress Notes (Signed)
Spoke to pt and instructed them to come at 0815 and to be NPO after 0000.  Confirmed no missed doses of AC and instructed to take in AM with a small sip of water.  Confirmed that pt will have a ride home and someone to stay with them for 24 hours after the procedure. Instructed patient to not wear any jewelry or lotion.

## 2023-12-06 ENCOUNTER — Other Ambulatory Visit: Payer: Self-pay

## 2023-12-06 ENCOUNTER — Ambulatory Visit: Payer: Medicare Other | Admitting: Adult Health

## 2023-12-06 ENCOUNTER — Encounter (HOSPITAL_COMMUNITY)
Admission: RE | Disposition: A | Discharge status: Home / Self Care | Payer: Self-pay | Source: Home / Self Care | Attending: Cardiology

## 2023-12-06 ENCOUNTER — Ambulatory Visit (HOSPITAL_COMMUNITY): Payer: Medicare Other

## 2023-12-06 ENCOUNTER — Ambulatory Visit (HOSPITAL_COMMUNITY)
Admission: RE | Admit: 2023-12-06 | Discharge: 2023-12-06 | Disposition: A | Discharge status: Home / Self Care | Payer: Medicare Other | Attending: Cardiology | Admitting: Cardiology

## 2023-12-06 ENCOUNTER — Encounter (HOSPITAL_COMMUNITY): Payer: Self-pay | Admitting: Cardiology

## 2023-12-06 DIAGNOSIS — E1151 Type 2 diabetes mellitus with diabetic peripheral angiopathy without gangrene: Secondary | ICD-10-CM | POA: Insufficient documentation

## 2023-12-06 DIAGNOSIS — E785 Hyperlipidemia, unspecified: Secondary | ICD-10-CM | POA: Insufficient documentation

## 2023-12-06 DIAGNOSIS — I4891 Unspecified atrial fibrillation: Secondary | ICD-10-CM

## 2023-12-06 DIAGNOSIS — G4733 Obstructive sleep apnea (adult) (pediatric): Secondary | ICD-10-CM | POA: Diagnosis not present

## 2023-12-06 DIAGNOSIS — I4819 Other persistent atrial fibrillation: Secondary | ICD-10-CM | POA: Insufficient documentation

## 2023-12-06 DIAGNOSIS — I2510 Atherosclerotic heart disease of native coronary artery without angina pectoris: Secondary | ICD-10-CM | POA: Insufficient documentation

## 2023-12-06 DIAGNOSIS — I10 Essential (primary) hypertension: Secondary | ICD-10-CM | POA: Insufficient documentation

## 2023-12-06 DIAGNOSIS — Z8673 Personal history of transient ischemic attack (TIA), and cerebral infarction without residual deficits: Secondary | ICD-10-CM | POA: Insufficient documentation

## 2023-12-06 DIAGNOSIS — D6869 Other thrombophilia: Secondary | ICD-10-CM | POA: Diagnosis not present

## 2023-12-06 DIAGNOSIS — Z951 Presence of aortocoronary bypass graft: Secondary | ICD-10-CM | POA: Diagnosis not present

## 2023-12-06 DIAGNOSIS — Z79899 Other long term (current) drug therapy: Secondary | ICD-10-CM | POA: Diagnosis not present

## 2023-12-06 DIAGNOSIS — Z7901 Long term (current) use of anticoagulants: Secondary | ICD-10-CM | POA: Diagnosis not present

## 2023-12-06 DIAGNOSIS — E119 Type 2 diabetes mellitus without complications: Secondary | ICD-10-CM | POA: Diagnosis not present

## 2023-12-06 HISTORY — PX: CARDIOVERSION: EP1203

## 2023-12-06 LAB — GLUCOSE, CAPILLARY: Glucose-Capillary: 127 mg/dL — ABNORMAL HIGH (ref 70–99)

## 2023-12-06 SURGERY — CARDIOVERSION (CATH LAB)
Anesthesia: Monitor Anesthesia Care

## 2023-12-06 MED ORDER — PROPOFOL 10 MG/ML IV BOLUS
INTRAVENOUS | Status: DC | PRN
  Administered 2023-12-06: 50 mg via INTRAVENOUS

## 2023-12-06 MED ORDER — SODIUM CHLORIDE 0.9 % IV SOLN: INTRAVENOUS | Status: DC | PRN

## 2023-12-06 SURGICAL SUPPLY — 1 items: PAD DEFIB RADIO PHYSIO CONN (PAD) ×1 IMPLANT

## 2023-12-06 NOTE — Research (Unsigned)
[facility] Cardioversion Informed Consent   Subject Name: Rick Melendez  Subject met inclusion and exclusion criteria.  The informed consent form, study requirements and expectations were reviewed with the subject and questions and concerns were addressed prior to the signing of the consent form.  The subject verbalized understanding of the trial requirements.  The subject agreed to participate in the [facility] Cardioversion trial and signed the informed consent at 0820 on 06 Dec 2023.  The informed consent was obtained prior to performance of any protocol-specific procedures for the subject.  A copy of the signed informed consent was given to the subject and a copy was placed in the subject's medical record.   Kloee Ballew A Roxan Hockey   16/Dec/2024

## 2023-12-06 NOTE — Transfer of Care (Signed)
Immediate Anesthesia Transfer of Care Note  Patient: Rick Melendez  Procedure(s) Performed: CARDIOVERSION  Patient Location: Cath Lab  Anesthesia Type:MAC  Level of Consciousness: awake, sedated, and drowsy  Airway & Oxygen Therapy: Patient Spontanous Breathing and Patient connected to nasal cannula oxygen  Post-op Assessment: Report given to RN and Post -op Vital signs reviewed and stable  Post vital signs: Reviewed and stable  Last Vitals:  Vitals Value Taken Time  BP 115/74   Temp 97.5   Pulse 65 12/06/23 0913  Resp 27 12/06/23 0913  SpO2 95 % 12/06/23 0913  Vitals shown include unfiled device data.  Last Pain:  Vitals:   12/06/23 0759  TempSrc: Temporal         Complications: No notable events documented.

## 2023-12-06 NOTE — Anesthesia Preprocedure Evaluation (Addendum)
Anesthesia Evaluation    Reviewed: Allergy & Precautions, Patient's Chart, lab work & pertinent test results  Airway Mallampati: II  TM Distance: >3 FB Neck ROM: Full    Dental  (+) Upper Dentures, Lower Dentures   Pulmonary sleep apnea and Continuous Positive Airway Pressure Ventilation , former smoker   Pulmonary exam normal        Cardiovascular hypertension, Pt. on home beta blockers + CAD, + CABG and + Peripheral Vascular Disease  Normal cardiovascular exam+ dysrhythmias Atrial Fibrillation      Neuro/Psych negative neurological ROS     GI/Hepatic negative GI ROS, Neg liver ROS,,,  Endo/Other  diabetes    Renal/GU Renal disease     Musculoskeletal negative musculoskeletal ROS (+)    Abdominal   Peds  Hematology  (+) Blood dyscrasia (Eliquis)   Anesthesia Other Findings A-FIB  Reproductive/Obstetrics                             Anesthesia Physical Anesthesia Plan  ASA: 3  Anesthesia Plan: MAC   Post-op Pain Management:    Induction:   PONV Risk Score and Plan: 1 and Propofol infusion and Treatment may vary due to age or medical condition  Airway Management Planned: Nasal Cannula  Additional Equipment:   Intra-op Plan:   Post-operative Plan:   Informed Consent: I have reviewed the patients History and Physical, chart, labs and discussed the procedure including the risks, benefits and alternatives for the proposed anesthesia with the patient or authorized representative who has indicated his/her understanding and acceptance.     Dental advisory given  Plan Discussed with: CRNA  Anesthesia Plan Comments:        Anesthesia Quick Evaluation

## 2023-12-06 NOTE — Anesthesia Postprocedure Evaluation (Signed)
Anesthesia Post Note  Patient: Rick Melendez  Procedure(s) Performed: CARDIOVERSION     Patient location during evaluation: Cath Lab Anesthesia Type: MAC Level of consciousness: awake Pain management: pain level controlled Vital Signs Assessment: post-procedure vital signs reviewed and stable Respiratory status: spontaneous breathing, nonlabored ventilation and respiratory function stable Cardiovascular status: blood pressure returned to baseline and stable Postop Assessment: no apparent nausea or vomiting Anesthetic complications: no   No notable events documented.  Last Vitals:  Vitals:   12/06/23 1000 12/06/23 1006  BP: 118/63   Pulse: 65 66  Resp: (!) 21 (!) 24  Temp:    SpO2: 98% 98%    Last Pain:  Vitals:   12/06/23 0934  TempSrc: Temporal  PainSc: 0-No pain                 Ceciley Buist P Elsi Stelzer

## 2023-12-06 NOTE — Interval H&P Note (Signed)
History and Physical Interval Note:  12/06/2023 8:20 AM  Rick Melendez  has presented today for surgery, with the diagnosis of AFIB.  The various methods of treatment have been discussed with the patient and family. After consideration of risks, benefits and other options for treatment, the patient has consented to  Procedure(s): CARDIOVERSION (N/A) as a surgical intervention.  The patient's history has been reviewed, patient examined, no change in status, stable for surgery.  I have reviewed the patient's chart and labs.  Questions were answered to the patient's satisfaction.     [name]

## 2023-12-06 NOTE — Procedures (Signed)
Electrical Cardioversion Procedure Note AKILI ANDUJO [id number] 1936-08-15  Procedure: Electrical Cardioversion Indications:  Atrial Fibrillation  Procedure Details Consent: Risks of procedure as well as the alternatives and risks of each were explained to the (patient/caregiver).  Consent for procedure obtained. Time Out: Verified patient identification, verified procedure, site/side was marked, verified correct patient position, special equipment/implants available, medications/allergies/relevent history reviewed, required imaging and test results available.  Performed  Patient placed on cardiac monitor, pulse oximetry, supplemental oxygen as necessary.  Sedation given:  Pt sedated by anesthesia with diprovan 50 mg IV. Pacer pads placed anterior and posterior chest.  Cardioverted 1 time(s).  Cardioverted at 200J.  Evaluation Findings: Post procedure EKG shows: NSR with PVCs. Complications: None Patient did tolerate procedure well.   [name] 12/06/2023, 8:18 AM

## 2023-12-06 NOTE — H&P (Signed)
ATRIAL FIB NEW PATIENT 12/02/2023 [facility] at [facility]    [place], [place], [place] [facility] Persistent atrial fibrillation [facility]) +1 more Dx Referred by Merri Brunette, MD Reason for Visit   Additional Documentation  Vitals: BP 120/72   Pulse 80   Ht 5\' 6"  (1.676 m)   Wt 86 kg   BMI 30.60 kg/m   BSA 2 m      More Vitals  Flowsheets: Anthropometrics,   NEWS,   MEWS Score,   Vital Signs,   Method of Visit  Encounter Info: Billing Info,   History,   Allergies,   Detailed Report   All Notes   Progress Notes by Danice Goltz, PA at 12/02/2023 11:30 AM  Author: Danice Goltz, PA Author Type: Physician Assistant Filed: 12/02/2023 12:04 PM  Note Status: Signed Cosign: Cosign Not Required Date of Service: 12/02/2023 11:30 AM  Editor: Danice Goltz, PA (Physician Assistant)             Expand All Collapse All      Primary Care Physician: Merri Brunette, MD Primary Cardiologist: Nanetta Batty, MD Electrophysiologist: None  Referring Physician: Rise Paganini NP/Dr Irving Burton Rick Melendez is a 87 y.o. male with a history of DM2, HTN, HLD, OSA on CPAP, CAD s/p CABG 2007, PAD s/p left leg stenting, osteoarthritis, CVA, atrial fibrillation who presents for follow up in the [facility].  The patient was initially diagnosed with atrial fibrillation 11/02/23 after presenting to the ED with symptoms of dizziness and weakness. Brain MRI showed a small acute infarct in the right paramedian pons. ECG showed rate controlled afib. Patient was started on Eliquis for a CHADS2VASC score of 7. Seen by Rise Paganini 11/15/23 and remained in afib at that time.   On follow up today, patient reports that he feels well but his energy level is not back to his baseline. He has no cardiac awareness of his arrhythmia. No bleeding issues on anticoagulation.    Today, he denies symptoms of palpitations, chest  pain, shortness of breath, orthopnea, PND, lower extremity edema, dizziness, presyncope, syncope, snoring, daytime somnolence, bleeding, or neurologic sequela. The patient is tolerating medications without difficulties and is otherwise without complaint today.      Atrial Fibrillation Risk Factors:   he does have symptoms or diagnosis of sleep apnea. he does not have a history of rheumatic fever. The patient does not have a history of early familial atrial fibrillation or other arrhythmias.   Atrial Fibrillation Management history:   Previous antiarrhythmic drugs: none Previous cardioversions: none Previous ablations: none Anticoagulation history: Eliquis   ROS- All systems are reviewed and negative except as per the HPI above.       Past Medical History:  Diagnosis Date   Arthritis     Coronary artery disease 2007    cabg x3   Diabetes mellitus without complication (HCC)     Hyperlipidemia     Hypertension     Influenza B 03/19/2017   OSA on CPAP      Dr Tresa Endo - follows and treatmentof sleep apnea   PVD (peripheral vascular disease) (HCC) 2003    DR BERRY-  -LEFT LEG STENTING                Current Outpatient Medications  Medication Sig Dispense Refill   albuterol (PROVENTIL HFA) 108 (90 Base) MCG/ACT inhaler Inhale 2 puffs into the lungs every 4 (  four) hours as needed for wheezing or shortness of breath.       apixaban (ELIQUIS) 5 MG TABS tablet Take 1 tablet (5 mg total) by mouth 2 (two) times daily. 180 tablet 0   Cyanocobalamin (B-12 PO) Take 1 tablet by mouth daily.       cyclobenzaprine (FLEXERIL) 5 MG tablet 1 tablet at bedtime as needed Orally Once a day for 30 days       ezetimibe (ZETIA) 10 MG tablet Take 10 mg by mouth daily.       famotidine (PEPCID) 20 MG tablet Take 20 mg by mouth 2 (two) times daily.       FARXIGA 10 MG TABS tablet Take 10 mg by mouth daily.       furosemide (LASIX) 20 MG tablet Take 20 mg by mouth daily.   5   ipratropium (ATROVENT)  0.06 % nasal spray Place 1 spray into the nose 3 (three) times daily as needed for rhinitis.       KERENDIA 10 MG TABS Take 10 mg by mouth daily.       meclizine (ANTIVERT) 25 MG tablet Take 25 mg by mouth 3 (three) times daily as needed for dizziness.       memantine (NAMENDA) 5 MG tablet Take 1 tablet  twice a day 180 tablet 3   metoprolol tartrate (LOPRESSOR) 25 MG tablet Take 0.5 tablets (12.5 mg total) by mouth 2 (two) times daily. 90 tablet 3   OVER THE COUNTER MEDICATION USES CPAP NIGHTLY       rosuvastatin (CRESTOR) 40 MG tablet Take 40 mg by mouth daily.       traMADol (ULTRAM) 50 MG tablet Take 50-100 mg by mouth See admin instructions. Take 50mg  (1 tablet) by mouth two to three times a day.          No current facility-administered medications for this encounter.        Physical Exam: BP 120/72   Pulse 80   Ht 5\' 6"  (1.676 m)   Wt 86 kg   BMI 30.60 kg/m    GEN: Well nourished, well developed in no acute distress NECK: No JVD; No carotid bruits CARDIAC: Irregularly irregular rate and rhythm, no murmurs, rubs, gallops RESPIRATORY:  Clear to auscultation without rales, wheezing or rhonchi  ABDOMEN: Soft, non-tender, non-distended EXTREMITIES:  No edema; No deformity       Wt Readings from Last 3 Encounters:  12/02/23 86 kg  11/15/23 84.4 kg  11/12/23 84.4 kg      EKG today demonstrates  Afib Vent. rate 80 BPM PR interval * ms QRS duration 106 ms QT/QTcB 396/456 ms     Echo 11/02/23 demonstrated   1. Left ventricular ejection fraction, by estimation, is 60 to 65%. The  left ventricle has normal function. The left ventricle has no regional  wall motion abnormalities. There is moderate concentric left ventricular  hypertrophy. Left ventricular diastolic function could not be evaluated.   2. Right ventricular systolic function is normal. The right ventricular  size is normal. Tricuspid regurgitation signal is inadequate for assessing  PA pressure.   3. The  mitral valve is degenerative. No evidence of mitral valve  regurgitation. No evidence of mitral stenosis. Severe mitral annular  calcification.   4. The aortic valve was not well visualized. Aortic valve regurgitation  is not visualized. Aortic valve area, by VTI measures 1.22 cm. Aortic  valve mean gradient measures 10.8 mmHg. Aortic valve Vmax measures 2.10  m/s.   5. Aortic dilatation noted. There is mild dilatation of the aortic root,  measuring 43 mm.   6. The inferior vena cava is normal in size with greater than 50%  respiratory variability, suggesting right atrial pressure of 3 mmHg.   7. Agitated saline contrast bubble study was negative, with no evidence  of any interatrial shunt.   Conclusion(s)/Recommendation(s): No intracardiac source of embolism  detected on this transthoracic study. Consider a transesophageal  echocardiogram to exclude cardiac source of embolism if clinically  indicated.      CHA2DS2-VASc Score = 7  The patient's score is based upon: CHF History: 0 HTN History: 1 Diabetes History: 1 Stroke History: 2 Vascular Disease History: 1 Age Score: 2 Gender Score: 0         ASSESSMENT AND PLAN: Persistent Atrial Fibrillation (ICD10:  I48.19) The patient's CHA2DS2-VASc score is 7, indicating a 11.2% annual risk of stroke.   Patient remains in rate controlled afib. We discussed rate vs rhythm control. Patient would like a trial of SR to see if his fatigue improves which I think is reasonable. Will arrange for DCCV, he is 4 weeks post Eliquis start. Check cbc/bmet today Continue Eliquis 5 mg BID Continue Lopressor 12.5 mg BID   Secondary Hypercoagulable State (ICD10:  D68.69) The patient is at significant risk for stroke/thromboembolism based upon his CHA2DS2-VASc Score of 7.  Continue Apixaban (Eliquis).    HTN Stable on current regimen   OSA  Encouraged nightly CPAP The importance of adequate treatment of sleep apnea was discussed today in order  to improve our ability to maintain sinus rhythm long term.   CAD S/p CABG 2007 No anginal symptoms  On statin     Follow up in the AF clinic post DCCV.    Informed Consent Shared Decision Making/Informed Consent The risks (stroke, cardiac arrhythmias rarely resulting in the need for a temporary or permanent pacemaker, skin irritation or burns and complications associated with conscious sedation including aspiration, arrhythmia, respiratory failure and death), benefits (restoration of normal sinus rhythm) and alternatives of a direct current cardioversion were explained in detail to Rick Melendez and he agrees to proceed.             Jorja Loa PA-C Afib Clinic [facility] [address] [place], [place] [address] [phone]      For DCCV; compliant with apixaban; no changes. [name]

## 2023-12-07 ENCOUNTER — Ambulatory Visit (INDEPENDENT_AMBULATORY_CARE_PROVIDER_SITE_OTHER): Payer: Medicare Other | Admitting: Podiatry

## 2023-12-07 ENCOUNTER — Encounter (HOSPITAL_COMMUNITY): Payer: Self-pay | Admitting: Cardiology

## 2023-12-07 DIAGNOSIS — M79674 Pain in right toe(s): Secondary | ICD-10-CM | POA: Diagnosis not present

## 2023-12-07 DIAGNOSIS — L84 Corns and callosities: Secondary | ICD-10-CM

## 2023-12-07 DIAGNOSIS — B351 Tinea unguium: Secondary | ICD-10-CM | POA: Diagnosis not present

## 2023-12-07 DIAGNOSIS — Z7901 Long term (current) use of anticoagulants: Secondary | ICD-10-CM | POA: Diagnosis not present

## 2023-12-07 DIAGNOSIS — M79675 Pain in left toe(s): Secondary | ICD-10-CM | POA: Diagnosis not present

## 2023-12-07 NOTE — Progress Notes (Signed)
Subjective: Chief Complaint  Patient presents with   [facility]    RM#[id number] DFC     87 year old male presents the office today for concerns of thick, elongated nails not able to trim himself.  No open lesions.  No injuries.  He is on Eliquis now  Objective: AAO x3, NAD DP/PT pulses palpable bilaterally, CRT less than 3 seconds Nails are hypertrophic, dystrophic, brittle, discolored, elongated 10. No surrounding redness or drainage. Tenderness nails 1-5 bilaterally. No open lesions or pre-ulcerative lesions are identified today. Hyperkeratotic lesion b/l sub 5 without any underlying ulceration, drainage or signs of infection No pain with calf compression, swelling, warmth, erythema  Assessment: Symptomatic onychomycosis, hyperkeratotic lesions x 2, on Eliquis   Plan: -All treatment options discussed with the patient including all alternatives, risks, complications.  -Toenails sharply debrided x 10 without any complications or bleeding. -Sharply debrided hyperkeratotic lesion  x 2 without any complications.  Moisturizer. -Patient encouraged to call the office with any questions, concerns, change in symptoms.   Return in about 3 months (around 03/06/2024) for routine care.  Vivi Barrack DPM

## 2023-12-13 DIAGNOSIS — E1151 Type 2 diabetes mellitus with diabetic peripheral angiopathy without gangrene: Secondary | ICD-10-CM | POA: Diagnosis not present

## 2023-12-13 DIAGNOSIS — I10 Essential (primary) hypertension: Secondary | ICD-10-CM | POA: Diagnosis not present

## 2023-12-13 DIAGNOSIS — I69322 Dysarthria following cerebral infarction: Secondary | ICD-10-CM | POA: Diagnosis not present

## 2023-12-13 DIAGNOSIS — I69354 Hemiplegia and hemiparesis following cerebral infarction affecting left non-dominant side: Secondary | ICD-10-CM | POA: Diagnosis not present

## 2023-12-23 ENCOUNTER — Ambulatory Visit (HOSPITAL_COMMUNITY)
Admission: RE | Admit: 2023-12-23 | Discharge: 2023-12-23 | Disposition: A | Discharge status: Home / Self Care | Payer: Medicare Other | Source: Ambulatory Visit | Attending: Physician Assistant | Admitting: Physician Assistant

## 2023-12-23 VITALS — BP 118/56 | HR 53 | Ht 66.0 in | Wt 191.2 lb

## 2023-12-23 DIAGNOSIS — E1151 Type 2 diabetes mellitus with diabetic peripheral angiopathy without gangrene: Secondary | ICD-10-CM | POA: Diagnosis not present

## 2023-12-23 DIAGNOSIS — Z7901 Long term (current) use of anticoagulants: Secondary | ICD-10-CM | POA: Insufficient documentation

## 2023-12-23 DIAGNOSIS — Z8673 Personal history of transient ischemic attack (TIA), and cerebral infarction without residual deficits: Secondary | ICD-10-CM | POA: Insufficient documentation

## 2023-12-23 DIAGNOSIS — I10 Essential (primary) hypertension: Secondary | ICD-10-CM | POA: Diagnosis not present

## 2023-12-23 DIAGNOSIS — Z79899 Other long term (current) drug therapy: Secondary | ICD-10-CM | POA: Insufficient documentation

## 2023-12-23 DIAGNOSIS — Z951 Presence of aortocoronary bypass graft: Secondary | ICD-10-CM | POA: Insufficient documentation

## 2023-12-23 DIAGNOSIS — D6869 Other thrombophilia: Secondary | ICD-10-CM | POA: Diagnosis not present

## 2023-12-23 DIAGNOSIS — I4819 Other persistent atrial fibrillation: Secondary | ICD-10-CM | POA: Insufficient documentation

## 2023-12-23 DIAGNOSIS — I2510 Atherosclerotic heart disease of native coronary artery without angina pectoris: Secondary | ICD-10-CM | POA: Diagnosis not present

## 2023-12-23 DIAGNOSIS — R001 Bradycardia, unspecified: Secondary | ICD-10-CM | POA: Diagnosis not present

## 2023-12-23 DIAGNOSIS — G4733 Obstructive sleep apnea (adult) (pediatric): Secondary | ICD-10-CM | POA: Diagnosis not present

## 2023-12-23 NOTE — Progress Notes (Signed)
 Primary Care Physician: Clarice Nottingham, MD Primary Cardiologist: Dorn Lesches, MD Electrophysiologist: None  Referring Physician: Lum Louis NP/Dr Lesches Lin Rick Melendez is a 88 y.o. male with a history of DM2, HTN, HLD, OSA on CPAP, CAD s/p CABG 2007, PAD s/p left leg stenting, osteoarthritis, CVA, atrial fibrillation who presents for follow up in the [facility].  The patient was initially diagnosed with atrial fibrillation 11/02/23 after presenting to the ED with symptoms of dizziness and weakness. Brain MRI showed a small acute infarct in the right paramedian pons. ECG showed rate controlled afib. Patient was started on Eliquis  for a CHADS2VASC score of 7. Seen by Lum Louis 11/15/23 and remained in afib at that time.  On follow up today, patient is s/p DCCV on 12/06/23. He remains in SR. He reports that he does not feel any different compared to when he was in afib. No bleeding issues on anticoagulation.   Today, he denies symptoms of palpitations, chest pain, shortness of breath, orthopnea, PND, lower extremity edema, dizziness, presyncope, syncope, snoring, daytime somnolence, bleeding, or neurologic sequela. The patient is tolerating medications without difficulties and is otherwise without complaint today.    Atrial Fibrillation Risk Factors:  he does have symptoms or diagnosis of sleep apnea. he does not have a history of rheumatic fever. The patient does not have a history of early familial atrial fibrillation or other arrhythmias.  Atrial Fibrillation Management history:  Previous antiarrhythmic drugs: none Previous cardioversions: 12/02/23 Previous ablations: none Anticoagulation history: Eliquis   ROS- All systems are reviewed and negative except as per the HPI above.  Past Medical History:  Diagnosis Date   Arthritis    Coronary artery disease 2007   cabg x3   Diabetes mellitus without complication (HCC)    Hyperlipidemia     Hypertension    Influenza B 03/19/2017   OSA on CPAP    Dr BURNARD - follows and treatmentof sleep apnea   PVD (peripheral vascular disease) (HCC) 2003   DR BERRY-  -LEFT LEG STENTING    Current Outpatient Medications  Medication Sig Dispense Refill   albuterol  (PROVENTIL  HFA) 108 (90 Base) MCG/ACT inhaler Inhale 2 puffs into the lungs every 4 (four) hours as needed for wheezing or shortness of breath.     apixaban  (ELIQUIS ) 5 MG TABS tablet Take 1 tablet (5 mg total) by mouth 2 (two) times daily. 180 tablet 0   Cyanocobalamin (B-12 PO) Take 1,000 mcg by mouth daily.     cyclobenzaprine (FLEXERIL) 5 MG tablet Take 5 mg by mouth daily as needed for muscle spasms.     ezetimibe  (ZETIA ) 10 MG tablet Take 10 mg by mouth daily.     famotidine (PEPCID) 20 MG tablet Take 20 mg by mouth 2 (two) times daily.     FARXIGA 10 MG TABS tablet Take 10 mg by mouth daily.     furosemide (LASIX) 20 MG tablet Take 20 mg by mouth daily.  5   ipratropium (ATROVENT) 0.06 % nasal spray Place 1 spray into the nose 3 (three) times daily as needed for rhinitis.     KERENDIA 10 MG TABS Take 10 mg by mouth daily.     meclizine (ANTIVERT) 25 MG tablet Take 25 mg by mouth 3 (three) times daily as needed for dizziness.     memantine  (NAMENDA ) 5 MG tablet Take 1 tablet  twice a day 180 tablet 3   metoprolol  tartrate (LOPRESSOR ) 25 MG tablet Take  0.5 tablets (12.5 mg total) by mouth 2 (two) times daily. 90 tablet 3   OVER THE COUNTER MEDICATION USES CPAP NIGHTLY     rosuvastatin  (CRESTOR ) 40 MG tablet Take 40 mg by mouth daily.     traMADol (ULTRAM) 50 MG tablet Take 50-100 mg by mouth See admin instructions. Take 50mg  (1 tablet) by mouth two to three times a day.     No current facility-administered medications for this encounter.    Physical Exam: BP (!) 118/56   Pulse (!) 53   Ht 5' 6 (1.676 m)   Wt 86.7 kg   BMI 30.86 kg/m   GEN: Well nourished, well developed in no acute distress NECK: No JVD; No  carotid bruits CARDIAC: Regular rate and rhythm, no murmurs, rubs, gallops RESPIRATORY:  Clear to auscultation without rales, wheezing or rhonchi  ABDOMEN: Soft, non-tender, non-distended EXTREMITIES:  No edema; No deformity    Wt Readings from Last 3 Encounters:  12/23/23 86.7 kg  12/02/23 86 kg  11/15/23 84.4 kg     EKG today demonstrates  SB, 1st degree AV block Vent. rate 53 BPM PR interval 218 ms QRS duration 104 ms QT/QTcB 442/414 ms   Echo 11/02/23 demonstrated   1. Left ventricular ejection fraction, by estimation, is 60 to 65%. The  left ventricle has normal function. The left ventricle has no regional  wall motion abnormalities. There is moderate concentric left ventricular  hypertrophy. Left ventricular diastolic function could not be evaluated.   2. Right ventricular systolic function is normal. The right ventricular  size is normal. Tricuspid regurgitation signal is inadequate for assessing  PA pressure.   3. The mitral valve is degenerative. No evidence of mitral valve  regurgitation. No evidence of mitral stenosis. Severe mitral annular  calcification.   4. The aortic valve was not well visualized. Aortic valve regurgitation  is not visualized. Aortic valve area, by VTI measures 1.22 cm. Aortic  valve mean gradient measures 10.8 mmHg. Aortic valve Vmax measures 2.10  m/s.   5. Aortic dilatation noted. There is mild dilatation of the aortic root,  measuring 43 mm.   6. The inferior vena cava is normal in size with greater than 50%  respiratory variability, suggesting right atrial pressure of 3 mmHg.   7. Agitated saline contrast bubble study was negative, with no evidence  of any interatrial shunt.   Conclusion(s)/Recommendation(s): No intracardiac source of embolism  detected on this transthoracic study. Consider a transesophageal  echocardiogram to exclude cardiac source of embolism if clinically  indicated.    CHA2DS2-VASc Score = 7  The patient's  score is based upon: CHF History: 0 HTN History: 1 Diabetes History: 1 Stroke History: 2 Vascular Disease History: 1 Age Score: 2 Gender Score: 0       ASSESSMENT AND PLAN: Persistent Atrial Fibrillation (ICD10:  I48.19) The patient's CHA2DS2-VASc score is 7, indicating a 11.2% annual risk of stroke.   S/p DCCV on 12/06/23 Patient appears to be maintaining SR. Given his advanced age and paucity of symptoms, would recommend conservative approach to his afib moving forward. Patient and family in agreement.  Continue Eliquis  5 mg BID Continue Lopressor  12.5 mg BID  Secondary Hypercoagulable State (ICD10:  D68.69) The patient is at significant risk for stroke/thromboembolism based upon his CHA2DS2-VASc Score of 7.  Continue Apixaban  (Eliquis ).   HTN Stable on current regimen  OSA  Encouraged nightly CPAP  CAD S/p CABG 2007 On statin No anginal symptoms   Follow up  with Lamarr Satterfield as scheduled. AF clinic as needed.     Daril Kicks PA-C Afib Clinic [facility] [address] [place], [place] [address] [phone]

## 2024-01-10 DIAGNOSIS — Z961 Presence of intraocular lens: Secondary | ICD-10-CM | POA: Diagnosis not present

## 2024-01-10 DIAGNOSIS — E119 Type 2 diabetes mellitus without complications: Secondary | ICD-10-CM | POA: Diagnosis not present

## 2024-01-10 DIAGNOSIS — H4720 Unspecified optic atrophy: Secondary | ICD-10-CM | POA: Diagnosis not present

## 2024-01-12 DIAGNOSIS — L97909 Non-pressure chronic ulcer of unspecified part of unspecified lower leg with unspecified severity: Secondary | ICD-10-CM | POA: Diagnosis not present

## 2024-01-12 DIAGNOSIS — E11622 Type 2 diabetes mellitus with other skin ulcer: Secondary | ICD-10-CM | POA: Diagnosis not present

## 2024-01-17 ENCOUNTER — Ambulatory Visit: Payer: Medicare Other | Admitting: Orthopedic Surgery

## 2024-01-17 ENCOUNTER — Telehealth: Payer: Self-pay | Admitting: Physician Assistant

## 2024-01-17 ENCOUNTER — Ambulatory Visit: Payer: Medicare Other | Admitting: Physician Assistant

## 2024-01-17 NOTE — Telephone Encounter (Signed)
Not ready to send rx in , [facility] patient is to call in one month

## 2024-01-17 NOTE — Telephone Encounter (Signed)
Called the house number no answer.

## 2024-01-17 NOTE — Telephone Encounter (Signed)
Pt's wife called in and left a message with the access nurse. Caller states husbands medication comes from [facility] through the mail and is a zero copay, she just confirming this has been renewed for 2025? Name of medication is memantine

## 2024-01-20 ENCOUNTER — Ambulatory Visit: Payer: Medicare Other | Admitting: Orthopedic Surgery

## 2024-01-27 ENCOUNTER — Ambulatory Visit: Payer: Medicare Other | Admitting: Orthopedic Surgery

## 2024-01-27 ENCOUNTER — Telehealth: Payer: Self-pay | Admitting: Physical Medicine and Rehabilitation

## 2024-01-27 DIAGNOSIS — M170 Bilateral primary osteoarthritis of knee: Secondary | ICD-10-CM | POA: Diagnosis not present

## 2024-01-27 NOTE — Telephone Encounter (Signed)
 Patient's wife was here. He would like an appointment with Dr. Daisey Dryer

## 2024-01-28 ENCOUNTER — Telehealth: Payer: Self-pay

## 2024-01-28 DIAGNOSIS — M48062 Spinal stenosis, lumbar region with neurogenic claudication: Secondary | ICD-10-CM

## 2024-01-28 DIAGNOSIS — M5416 Radiculopathy, lumbar region: Secondary | ICD-10-CM

## 2024-01-28 NOTE — Telephone Encounter (Signed)
 Wife call an stated that Rick Melendez, had a mini stroke in Nov. The last injection was in Oct. It worked well for him until he had the stroke.He received 75% relief. He fell on the floor the day he had the stroke. He came home with a walker and when to PT. His back has been hurting she said his pain score is around a 5/6 but cane be a 10. He has difficulty walking bc of the pain. OV or injection?

## 2024-01-29 NOTE — Addendum Note (Signed)
 Addended by: Milas Alias on: 01/29/2024 09:41 AM   Modules accepted: Orders

## 2024-01-29 NOTE — Telephone Encounter (Signed)
 I put in referral for repeat L4 transforaminal injection.  Would probably evaluate him when he comes in for inj.  Make it a 30-minute appointment.

## 2024-01-31 ENCOUNTER — Encounter: Payer: Self-pay | Admitting: Orthopedic Surgery

## 2024-01-31 DIAGNOSIS — M170 Bilateral primary osteoarthritis of knee: Secondary | ICD-10-CM | POA: Diagnosis not present

## 2024-01-31 MED ORDER — LIDOCAINE HCL 1 % IJ SOLN
5.0000 mL | INTRAMUSCULAR | Status: AC | PRN
  Administered 2024-01-31: 5 mL

## 2024-01-31 MED ORDER — LIDOCAINE HCL 1 % IJ SOLN
5.0000 mL | INTRAMUSCULAR | Status: AC | PRN
Start: 2024-01-31 — End: 2024-01-31
  Administered 2024-01-31: 5 mL

## 2024-01-31 MED ORDER — METHYLPREDNISOLONE ACETATE 40 MG/ML IJ SUSP
40.0000 mg | INTRAMUSCULAR | Status: AC | PRN
  Administered 2024-01-31: 40 mg via INTRA_ARTICULAR

## 2024-01-31 NOTE — Progress Notes (Signed)
 Office Visit Note   Patient: Rick Melendez           Date of Birth: 08-06-1936           MRN: [id number] Visit Date: 01/27/2024              Requested by: Clarice Nottingham, MD [address] [place],  [place] [address] PCP: Clarice Nottingham, MD  Chief Complaint  Patient presents with   Right Knee - Follow-up   Left Knee - Follow-up      HPI: Patient is an 88 year old gentleman who presents with osteoarthritis bilateral knees pain.  Patient states he is status post a stroke he is getting epidural steroid injections.  Assessment & Plan: Visit Diagnoses:  1. Bilateral primary osteoarthritis of knee     Plan: Both knees were injected he tolerated this well.  Follow-Up Instructions: Return if symptoms worsen or fail to improve.   Ortho Exam  Patient is alert, oriented, no adenopathy, well-dressed, normal affect, normal respiratory effort. Examination patient uses a cane for ambulation he has varus alignment of both knees.  He has crepitation range of motion of both knees there is no cellulitis bilaterally.  Collaterals and cruciates are stable.  Imaging: No results found. No images are attached to the encounter.  Labs: Lab Results  Component Value Date   HGBA1C 6.9 (H) 11/01/2023   REPTSTATUS 03/24/2017 FINAL 03/19/2017   CULT NO GROWTH 5 DAYS 03/19/2017     Lab Results  Component Value Date   ALBUMIN 3.3 (L) 11/01/2023    Lab Results  Component Value Date   MG 1.5 (L) 03/19/2017   No results found for: VD25OH  No results found for: PREALBUMIN    Latest Ref Rng & Units 12/02/2023   12:51 PM 11/03/2023    4:27 AM 11/01/2023    4:34 PM  CBC EXTENDED  WBC 4.0 - 10.5 K/uL 10.2  11.5  9.7   RBC 4.22 - 5.81 MIL/uL 5.73  5.87  6.10   Hemoglobin 13.0 - 17.0 g/dL 85.8  85.2  84.6   HCT 39.0 - 52.0 % 47.9  48.1  51.2   Platelets 150 - 400 K/uL 315  231  253   NEUT# 1.7 - 7.7 K/uL   5.9   Lymph# 0.7 - 4.0 K/uL   2.7      There is no height or  weight on file to calculate BMI.  Orders:  No orders of the defined types were placed in this encounter.  No orders of the defined types were placed in this encounter.    Procedures: Large Joint Inj: bilateral knee on 01/31/2024 1:07 PM Indications: pain and diagnostic evaluation Details: 22 G 1.5 in needle, anteromedial approach  Arthrogram: No  Medications (Right): 5 mL lidocaine  1 %; 40 mg methylPREDNISolone  acetate 40 MG/ML Medications (Left): 5 mL lidocaine  1 %; 40 mg methylPREDNISolone  acetate 40 MG/ML Outcome: tolerated well, no immediate complications Procedure, treatment alternatives, risks and benefits explained, specific risks discussed. Consent was given by the patient. Immediately prior to procedure a time out was called to verify the correct patient, procedure, equipment, support staff and site/side marked as required. Patient was prepped and draped in the usual sterile fashion.      Clinical Data: No additional findings.  ROS:  All other systems negative, except as noted in the HPI. Review of Systems  Objective: Vital Signs: There were no vitals taken for this visit.  Specialty Comments:  EXAM: MRI  LUMBAR SPINE WITHOUT CONTRAST   TECHNIQUE: Multiplanar, multisequence MR imaging of the lumbar spine was performed. No intravenous contrast was administered.   COMPARISON:  Lumbar MRI 05/27/2014   FINDINGS: Segmentation:  Normal.  Lowest disc space L5-S1   Alignment: Mild anterolisthesis T12-L1. 4 mm retrolisthesis L1-2. Slight retrolisthesis L3-4. 6 mm anterolisthesis L4-5. Mild retrolisthesis L5-S1.   Vertebrae:  Normal bone marrow.  Negative for fracture or mass.   Conus medullaris and cauda equina: Conus extends to the L1-2 level. Conus and cauda equina appear normal.   Paraspinal and other soft tissues: Negative for mass or adenopathy. No soft tissue edema or fluid collection.   Disc levels:   T12-L1: Advanced disc degeneration left greater  than right. Prominent left-sided spurring. Bilateral facet degeneration. Moderate spinal stenosis. Severe subarticular and foraminal stenosis on the left. Mild subarticular stenosis on the right. Mild progression of stenosis on the left since the prior study   L1-2: Advanced disc degeneration with diffuse endplate spurring and marked disc space narrowing. Mild facet degeneration. Mild to moderate spinal stenosis. Moderate subarticular stenosis bilaterally. No interval change   L2-3: Disc degeneration with diffuse disc bulging and endplate spurring. Mild facet degeneration. Mild subarticular stenosis bilaterally with mild progression   L3-4: Disc degeneration with diffuse disc bulging and spurring, right greater than left. Mild facet degeneration. Mild spinal stenosis. Moderate to severe right subarticular stenosis. Moderate left subarticular stenosis. No significant change   L4-5: 6 mm anterolisthesis with severe facet degeneration and diffuse bulging of the disc. Severe spinal stenosis and severe subarticular stenosis bilaterally have progressed since the prior study. In addition, there is severe right foraminal encroachment with right L4 nerve root impingement and moderate left foraminal encroachment.   L5-S1: Left paracentral disc protrusion with compression of the left S1 nerve root similar to the prior study. Mild facet degeneration bilaterally.   IMPRESSION: Advanced multilevel degenerative change throughout the lumbar spine. There has been progression of degenerative changes stenosis at multiple levels as described above   6 mm anterolisthesis L4-5. Severe spinal and severe subarticular stenosis bilaterally have progressed in the interval. Severe right foraminal encroachment.     Electronically Signed   By: Carlin Gaskins M.D.   On: 08/22/2020 21:23  PMFS History: Patient Active Problem List   Diagnosis Date Noted   Persistent atrial fibrillation (HCC)  12/02/2023   Hypercoagulable state due to persistent atrial fibrillation (HCC) 12/02/2023   Acute stroke due to ischemia (HCC) 11/02/2023   DOE (dyspnea on exertion) 08/11/2023   Upper airway cough syndrome 08/11/2023   Slow heart rate 04/14/2022   Influenza B    AKI (acute kidney injury) (HCC)    CAP (community acquired pneumonia) 03/19/2017   Bilateral primary osteoarthritis of knee 01/28/2017   Impingement syndrome of right shoulder 01/28/2017   CAD - CABG X 2 9/07 LIMA-LAD, SVG-OM. Low risk Myoview 7/12 09/07/2013   Essential hypertension 09/07/2013   Dyslipidemia 09/07/2013   Diabetes mellitus (HCC) 09/07/2013   Chronic renal insufficiency, stage III (moderate)- SCr 1.6 (Aug 2013) 09/07/2013   Sleep apnea- compliant with C-pap 09/07/2013   PVD (peripheral vascular disease)- Rt iliac disease by doppler 7/13 09/07/2013   Past Medical History:  Diagnosis Date   Arthritis    Coronary artery disease 2007   cabg x3   Diabetes mellitus without complication (HCC)    Hyperlipidemia    Hypertension    Influenza B 03/19/2017   OSA on CPAP    Dr BURNARD - follows and  treatmentof sleep apnea   PVD (peripheral vascular disease) (HCC) 2003   DR BERRY-  -LEFT LEG STENTING    Family History  Problem Relation Age of Onset   Cancer Father    Cancer Brother     Past Surgical History:  Procedure Laterality Date   CARDIAC CATHETERIZATION  0911/2007   3 vessel CAD WITH LEFT MAIN CAD SEVERE ,norm lLIMA,LV nom. ,evidence for very mild aortic stenosis with gradient, mild stenosis distal abdnminal aotra and proximal left common iliac artery 30%   CARDIOVERSION N/A 12/06/2023   Procedure: CARDIOVERSION;  Surgeon: Pietro Redell RAMAN, MD;  Location: [facility];  Service: Cardiovascular;  Laterality: N/A;   CORONARY ARTERY BYPASS GRAFT  08/31/2006   DR DIANNIA to LAD, VEIN TO AN OBTUSE MARGINAL BRANCH AND  RIGHT CORONARY ARTERY   CPET  04/13/2012   normal   DOPPLER ECHOCARDIOGRAPHY   07/08/2011   EF =>55%   lower arterial doppler  07/01/2012   mildly abnormal lower extremity;ABIs >1 bilaterally with moderately high-grade right iliac stenosis that had not changed from proir study.   NM MYOCAR PERF WALL MOTION  7/182012   EF 59%, normal myocardial perfusio   Social History   Occupational History   Not on file  Tobacco Use   Smoking status: Former    Current packs/day: 0.00    Types: Cigarettes    Quit date: 09/03/1981    Years since quitting: 42.4   Smokeless tobacco: Current    Types: Chew  Substance and Sexual Activity   Alcohol use: Yes   Drug use: No   Sexual activity: Not on file

## 2024-02-08 DIAGNOSIS — M25572 Pain in left ankle and joints of left foot: Secondary | ICD-10-CM | POA: Diagnosis not present

## 2024-02-09 ENCOUNTER — Encounter: Payer: Medicare Other | Admitting: Physical Medicine and Rehabilitation

## 2024-02-10 ENCOUNTER — Ambulatory Visit (INDEPENDENT_AMBULATORY_CARE_PROVIDER_SITE_OTHER): Payer: Medicare Other

## 2024-02-10 ENCOUNTER — Ambulatory Visit: Payer: Medicare Other | Admitting: Podiatry

## 2024-02-10 DIAGNOSIS — M7752 Other enthesopathy of left foot: Secondary | ICD-10-CM | POA: Diagnosis not present

## 2024-02-10 DIAGNOSIS — M25572 Pain in left ankle and joints of left foot: Secondary | ICD-10-CM

## 2024-02-10 DIAGNOSIS — M10072 Idiopathic gout, left ankle and foot: Secondary | ICD-10-CM | POA: Diagnosis not present

## 2024-02-10 MED ORDER — COLCHICINE 0.6 MG PO TABS: 0.6000 mg | ORAL_TABLET | Freq: Every day | ORAL | 0 refills | Status: AC

## 2024-02-10 NOTE — Progress Notes (Signed)
 Subjective: Chief Complaint  Patient presents with   Ankle Pain    RM#13 Left ankle pain started two weeks ago no injury. Swelling to ankle and sore to the touch minor redness.   88 year old male presents the office today for 2-week history of left ankle pain, swelling to the lateral ankle.  He is not sure if he were to hit it but is able to walk normally.  No injuries that he reports.  He did see his PCP and was referred.  No other treatment.  He has no history of gout.  Objective: AAO x3, NAD DP/PT pulses palpable bilaterally, CRT less than 3 seconds There is localized edema and erythema to the lateral aspect of the ankle with tenderness palpation of the sinus tarsi, peroneal tendons.  Ankle, subtalar range of motion intact.  No areas of fluctuation or crepitation.  There is no open lesions.  There is no pain on the medial ankle.  No pain Achilles tendon.  No other areas of discomfort. No pain with calf compression, swelling, warmth, erythema  Assessment: Capsulitis left ankle, possible gout  Plan: -All treatment options discussed with the patient including all alternatives, risks, complications.  -X-rays obtained reviewed left ankle.  3 views were obtained.  Not able to appreciate any evidence of acute fracture.  Posterior calcaneal spurring present.  Ankle, subtalar joint space maintained.  -Discussed different etiologies including possible gout.  No injuries that he reports.  There is no open lesions or other signs of infection.  Will check CBC, sed rate, CRP, BMP, uric acid -Will start colchicine 0.6 mg daily -Discussed immobilization in cam boot likely this will wrap the ankle he is able to walk without pain currently so I deferred on this. -Patient encouraged to call the office with any questions, concerns, change in symptoms.   Return in about 2 weeks (around 02/24/2024) for ankle pain.  Vivi Barrack DPM

## 2024-02-11 LAB — BASIC METABOLIC PANEL
BUN: 24 mg/dL (ref 7–25)
CO2: 26 mmol/L (ref 20–32)
Calcium: 9.4 mg/dL (ref 8.6–10.3)
Chloride: 107 mmol/L (ref 98–110)
Creat: 1.13 mg/dL (ref 0.70–1.22)
Glucose, Bld: 122 mg/dL (ref 65–139)
Potassium: 5.2 mmol/L (ref 3.5–5.3)
Sodium: 142 mmol/L (ref 135–146)

## 2024-02-11 LAB — CBC WITH DIFFERENTIAL/PLATELET
Absolute Lymphocytes: 3305 {cells}/uL (ref 850–3900)
Absolute Monocytes: 799 {cells}/uL (ref 200–950)
Basophils Absolute: 65 {cells}/uL (ref 0–200)
Basophils Relative: 0.6 %
Eosinophils Absolute: 140 {cells}/uL (ref 15–500)
Eosinophils Relative: 1.3 %
HCT: 46.2 % (ref 38.5–50.0)
Hemoglobin: 14 g/dL (ref 13.2–17.1)
MCH: 25.2 pg — ABNORMAL LOW (ref 27.0–33.0)
MCHC: 30.3 g/dL — ABNORMAL LOW (ref 32.0–36.0)
MCV: 83.2 fL (ref 80.0–100.0)
MPV: 10.5 fL (ref 7.5–12.5)
Monocytes Relative: 7.4 %
Neutro Abs: 6491 {cells}/uL (ref 1500–7800)
Neutrophils Relative %: 60.1 %
Platelets: 230 10*3/uL (ref 140–400)
RBC: 5.55 10*6/uL (ref 4.20–5.80)
RDW: 16.5 % — ABNORMAL HIGH (ref 11.0–15.0)
Total Lymphocyte: 30.6 %
WBC: 10.8 10*3/uL (ref 3.8–10.8)

## 2024-02-11 LAB — URIC ACID: Uric Acid, Serum: 5.2 mg/dL (ref 4.0–8.0)

## 2024-02-11 LAB — C-REACTIVE PROTEIN: CRP: <3 mg/L (ref <8.0)

## 2024-02-11 LAB — SEDIMENTATION RATE: Sed Rate: 6 mm/h (ref 0–20)

## 2024-02-16 ENCOUNTER — Other Ambulatory Visit: Payer: Self-pay

## 2024-02-16 ENCOUNTER — Ambulatory Visit: Payer: Medicare Other | Admitting: Physical Medicine and Rehabilitation

## 2024-02-16 VITALS — BP 125/73 | HR 75

## 2024-02-16 DIAGNOSIS — M5416 Radiculopathy, lumbar region: Secondary | ICD-10-CM

## 2024-02-16 MED ORDER — METHYLPREDNISOLONE ACETATE 40 MG/ML IJ SUSP
40.0000 mg | Freq: Once | INTRAMUSCULAR | Status: AC
  Administered 2024-02-16: 40 mg

## 2024-02-16 NOTE — Progress Notes (Signed)
 Pain Score----5 Patient takes Eliquis No Allergies to Contrast Dye

## 2024-02-16 NOTE — Procedures (Signed)
 Lumbosacral Transforaminal Epidural Steroid Injection - Sub-Pedicular Approach with Fluoroscopic Guidance  Patient: Rick Melendez      Date of Birth: 06-Sep-1936 MRN: [id number] PCP: Merri Brunette, MD      Visit Date: 02/16/2024   Universal Protocol:    Date/Time: 02/16/2024  Consent Given By: the patient  Position: PRONE  Additional Comments: Vital signs were monitored before and after the procedure. Patient was prepped and draped in the usual sterile fashion. The correct patient, procedure, and site was verified.   Injection Procedure Details:   Procedure diagnoses: Lumbar radiculopathy [M54.16]    Meds Administered:  Meds ordered this encounter  Medications   methylPREDNISolone acetate (DEPO-MEDROL) injection 40 mg    Laterality: Bilateral  Location/Site: L4  Needle:5.0 in., 22 ga.  Short bevel or Quincke spinal needle  Needle Placement: Transforaminal  Findings:    -Comments: Excellent flow of contrast along the nerve, nerve root and into the epidural space. Injections went well but with medication delivery on each side he did experience cramping sensation in each leg respectively, right more than left.  Procedure Details: After squaring off the end-plates to get a true AP view, the C-arm was positioned so that an oblique view of the foramen as noted above was visualized. The target area is just inferior to the "nose of the scotty dog" or sub pedicular. The soft tissues overlying this structure were infiltrated with 2-3 ml. of 1% Lidocaine without Epinephrine.  The spinal needle was inserted toward the target using a "trajectory" view along the fluoroscope beam.  Under AP and lateral visualization, the needle was advanced so it did not puncture dura and was located close the 6 O'Clock position of the pedical in AP tracterory. Biplanar projections were used to confirm position. Aspiration was confirmed to be negative for CSF and/or blood. A 1-2 ml. volume of Isovue-250  was injected and flow of contrast was noted at each level. Radiographs were obtained for documentation purposes.   After attaining the desired flow of contrast documented above, a 0.5 to 1.0 ml test dose of 0.25% Marcaine was injected into each respective transforaminal space.  The patient was observed for 90 seconds post injection.  After no sensory deficits were reported, and normal lower extremity motor function was noted,   the above injectate was administered so that equal amounts of the injectate were placed at each foramen (level) into the transforaminal epidural space.   Additional Comments:  No complications occurred Dressing: 2 x 2 sterile gauze and Band-Aid    Post-procedure details: Patient was observed during the procedure. Post-procedure instructions were reviewed.  Patient left the clinic in stable condition.

## 2024-02-16 NOTE — Progress Notes (Signed)
 Rick Melendez - 87 y.o. male MRN [id number]  Date of birth: [birth year]  Office Visit Note: Visit Date: 02/16/2024 PCP: Merri Brunette, MD Referred by: Merri Brunette, MD  Subjective: Chief Complaint  Patient presents with   Lower Back - Pain   HPI:  Rick Melendez is a 88 y.o. male who comes in today for planned repeat Bilateral L4-5  Lumbar Transforaminal epidural steroid injection with fluoroscopic guidance.  The patient has failed conservative care including home exercise, medications, time and activity modification.  This injection will be diagnostic and hopefully therapeutic.  Please see requesting physician notes for further details and justification. Patient received more than 50% pain relief from prior injection. He does have severe stenosis. With his age and heart and stroke history he is not a surgical candidate.  Referring: Ellin Goodie, FNP   ROS Otherwise per HPI.  Assessment & Plan: Visit Diagnoses:    ICD-10-CM   1. Lumbar radiculopathy  M54.16 XR C-ARM NO REPORT    Epidural Steroid injection    methylPREDNISolone acetate (DEPO-MEDROL) injection 40 mg      Plan: No additional findings.   Meds & Orders:  Meds ordered this encounter  Medications   methylPREDNISolone acetate (DEPO-MEDROL) injection 40 mg    Orders Placed This Encounter  Procedures   XR C-ARM NO REPORT   Epidural Steroid injection    Follow-up: Return for visit to requesting provider as needed.   Procedures: No procedures performed  Lumbosacral Transforaminal Epidural Steroid Injection - Sub-Pedicular Approach with Fluoroscopic Guidance  Patient: Rick Melendez      Date of Birth: [birth year] MRN: [id number] PCP: Merri Brunette, MD      Visit Date: 02/16/2024   Universal Protocol:    Date/Time: 02/16/2024  Consent Given By: the patient  Position: PRONE  Additional Comments: Vital signs were monitored before and after the procedure. Patient was prepped and draped in the usual  sterile fashion. The correct patient, procedure, and site was verified.   Injection Procedure Details:   Procedure diagnoses: Lumbar radiculopathy [M54.16]    Meds Administered:  Meds ordered this encounter  Medications   methylPREDNISolone acetate (DEPO-MEDROL) injection 40 mg    Laterality: Bilateral  Location/Site: L4  Needle:5.0 in., 22 ga.  Short bevel or Quincke spinal needle  Needle Placement: Transforaminal  Findings:    -Comments: Excellent flow of contrast along the nerve, nerve root and into the epidural space. Injections went well but with medication delivery on each side he did experience cramping sensation in each leg respectively, right more than left.  Procedure Details: After squaring off the end-plates to get a true AP view, the C-arm was positioned so that an oblique view of the foramen as noted above was visualized. The target area is just inferior to the "nose of the scotty dog" or sub pedicular. The soft tissues overlying this structure were infiltrated with 2-3 ml. of 1% Lidocaine without Epinephrine.  The spinal needle was inserted toward the target using a "trajectory" view along the fluoroscope beam.  Under AP and lateral visualization, the needle was advanced so it did not puncture dura and was located close the 6 O'Clock position of the pedical in AP tracterory. Biplanar projections were used to confirm position. Aspiration was confirmed to be negative for CSF and/or blood. A 1-2 ml. volume of Isovue-250 was injected and flow of contrast was noted at each level. Radiographs were obtained for documentation purposes.   After attaining the desired flow of contrast  documented above, a 0.5 to 1.0 ml test dose of 0.25% Marcaine was injected into each respective transforaminal space.  The patient was observed for 90 seconds post injection.  After no sensory deficits were reported, and normal lower extremity motor function was noted,   the above injectate was  administered so that equal amounts of the injectate were placed at each foramen (level) into the transforaminal epidural space.   Additional Comments:  No complications occurred Dressing: 2 x 2 sterile gauze and Band-Aid    Post-procedure details: Patient was observed during the procedure. Post-procedure instructions were reviewed.  Patient left the clinic in stable condition.    Clinical History: EXAM: MRI LUMBAR SPINE WITHOUT CONTRAST   TECHNIQUE: Multiplanar, multisequence MR imaging of the lumbar spine was performed. No intravenous contrast was administered.   COMPARISON:  Lumbar MRI 05/27/2014   FINDINGS: Segmentation:  Normal.  Lowest disc space L5-S1   Alignment: Mild anterolisthesis T12-L1. 4 mm retrolisthesis L1-2. Slight retrolisthesis L3-4. 6 mm anterolisthesis L4-5. Mild retrolisthesis L5-S1.   Vertebrae:  Normal bone marrow.  Negative for fracture or mass.   Conus medullaris and cauda equina: Conus extends to the L1-2 level. Conus and cauda equina appear normal.   Paraspinal and other soft tissues: Negative for mass or adenopathy. No soft Melendez edema or fluid collection.   Disc levels:   T12-L1: Advanced disc degeneration left greater than right. Prominent left-sided spurring. Bilateral facet degeneration. Moderate spinal stenosis. Severe subarticular and foraminal stenosis on the left. Mild subarticular stenosis on the right. Mild progression of stenosis on the left since the prior study   L1-2: Advanced disc degeneration with diffuse endplate spurring and marked disc space narrowing. Mild facet degeneration. Mild to moderate spinal stenosis. Moderate subarticular stenosis bilaterally. No interval change   L2-3: Disc degeneration with diffuse disc bulging and endplate spurring. Mild facet degeneration. Mild subarticular stenosis bilaterally with mild progression   L3-4: Disc degeneration with diffuse disc bulging and spurring, right greater  than left. Mild facet degeneration. Mild spinal stenosis. Moderate to severe right subarticular stenosis. Moderate left subarticular stenosis. No significant change   L4-5: 6 mm anterolisthesis with severe facet degeneration and diffuse bulging of the disc. Severe spinal stenosis and severe subarticular stenosis bilaterally have progressed since the prior study. In addition, there is severe right foraminal encroachment with right L4 nerve root impingement and moderate left foraminal encroachment.   L5-S1: Left paracentral disc protrusion with compression of the left S1 nerve root similar to the prior study. Mild facet degeneration bilaterally.   IMPRESSION: Advanced multilevel degenerative change throughout the lumbar spine. There has been progression of degenerative changes stenosis at multiple levels as described above   6 mm anterolisthesis L4-5. Severe spinal and severe subarticular stenosis bilaterally have progressed in the interval. Severe right foraminal encroachment.     Electronically Signed   By: Marlan Palau M.D.   On: 08/22/2020 21:23     Objective:  VS:  HT:    WT:   BMI:     BP:125/73  HR:75bpm  TEMP: ( )  RESP:  Physical Exam Vitals and nursing note reviewed.  Constitutional:      General: He is not in acute distress.    Appearance: Normal appearance. He is not ill-appearing.  HENT:     Head: Normocephalic and atraumatic.     Right Ear: External ear normal.     Left Ear: External ear normal.     Nose: No congestion.  Eyes:  Extraocular Movements: Extraocular movements intact.  Cardiovascular:     Rate and Rhythm: Normal rate.     Pulses: Normal pulses.  Pulmonary:     Effort: Pulmonary effort is normal. No respiratory distress.  Abdominal:     General: There is no distension.     Palpations: Abdomen is soft.  Musculoskeletal:        General: No tenderness or signs of injury.     Cervical back: Neck supple.     Right lower leg: No  edema.     Left lower leg: No edema.     Comments: Patient has good distal strength without clonus.  Skin:    Findings: No erythema or rash.  Neurological:     General: No focal deficit present.     Mental Status: He is alert and oriented to person, place, and time.     Sensory: No sensory deficit.     Motor: No weakness or abnormal muscle tone.     Coordination: Coordination normal.  Psychiatric:        Mood and Affect: Mood normal.        Behavior: Behavior normal.      Imaging: XR C-ARM NO REPORT Result Date: 02/16/2024 Please see Notes tab for imaging impression.

## 2024-02-16 NOTE — Progress Notes (Unsigned)
 Cardiology Office Note    Date:  02/18/2024  ID:  Irineo, Gaulin 04/28/1936, MRN [id number] PCP:  Merri Brunette, MD  Cardiologist:  Nanetta Batty, MD  Electrophysiologist:  None   Chief Complaint: Follow up for CAD and afib   History of Present Illness: .    [place] CURRENT is a 88 y.o. male with visit-pertinent history of CAD s/p CABG in 2007, PAD s/p left common iliac artery stent in 2003, hypertension, aortic root dilation, hyperlipidemia, type 2 diabetes mellitus, OSA, PVD, CVA in 2024 and atrial fibrillation.  In 2014 he underwent Myoview that was nonischemic.  In September 2017 he underwent coronary artery bypass grafting x 2 with LIMA to his LAD, vein to obtuse marginal branch.  He had left common iliac artery stenting to his left leg in 2003, Doppler in July 2013 showed right iliac disease.  Most recent vascular ultrasound ABI on 07/05/2023 with right/left ABI within normal range.  Most recent vascular ultrasound of aorta on 07/05/2023 showed patent IVC.  History of OSA with noncompliance with CPAP.  Patient recently on admitted from November 11 through 13, 2024 with CVA.  He presented to the ED with complaints of weakness, dizziness for several days.  On admission he had persistent left-sided weakness, dysarthria.  He noted started to feel dizzy the morning of 11/9.  Initial CT head did not show any acute intracranial abnormalities, MRI brain showed small acute infarct in the right paramedian pons.  EKG showed new onset A-fib at a rate of 72 bpm, felt to be probable cause for his stroke.  Carotid duplex completed did not show any significant stenosis.  Echo showed EF 60 to 65%, no RWMA, moderate LVH, RV SF normal, severe mitral annular calcification, mild dilation of the aortic root measuring 43 mm.  No intracardiac source of embolism.  His aspirin and Plavix were discontinued and he was started on Eliquis 5 mg twice daily.  Patient was seen in clinic on 11/15/2023, he reported that he  was doing well, he noted ongoing fatigue and generalized weakness.  At office visit he was in rate controlled atrial fibrillation, it was recommended by Dr. Allyson Sabal that he be referred to A-fib clinic for ongoing management.  He was started on metoprolol tartrate 12.5 mg twice daily.  Patient was seen in A-fib clinic on 12/02/2023, patient remained in rate controlled A-fib.  On 12/06/2023 he underwent DCCV.  On follow-up with A-fib clinic on 12/23/2023 he was in sinus rhythm, patient reported that he did not feel any different compared to when he was in A-fib.  He was continued on metoprolol to tartrate 12.5 mg twice daily.  Today he presents for follow-up with his wife.  He reports that he is doing very well overall. He has no concerns or complaints today.  He denies chest pain, shortness of breath, lower extremity edema, orthopnea or PND.  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  He denies any feelings of palpitations or increased heart rate, he denies any dizziness, lightheadedness presyncope or syncope.  He denies any bleeding problems on Eliquis.  Labwork independently reviewed: 02/10/2024: Sodium 142, potassium 5.2, creatinine 1.13, hemoglobin 14, hematocrit 46.2 ROS: .   Today he denies chest pain, shortness of breath, lower extremity edema, fatigue, palpitations, melena, hematuria, hemoptysis, diaphoresis, weakness, presyncope, syncope, orthopnea, and PND.  All other systems are reviewed and otherwise negative. Studies Reviewed: [name]    EKG:  EKG is not ordered today.  CV Studies:  Cardiac Studies & Procedures   ______________________________________________________________________________________________ CARDIAC CATHETERIZATION  CARDIAC CATHETERIZATION 08/31/2006   STRESS TESTS  MYOCARDIAL PERFUSION IMAGING 07/08/2011   ECHOCARDIOGRAM  ECHOCARDIOGRAM COMPLETE BUBBLE STUDY 11/02/2023  Narrative ECHOCARDIOGRAM REPORT    Patient Name:   KYSHAUN BARNETTE Seiden Date of Exam:  11/02/2023 Medical Rec #:  [id number]      Height:       67.0 in Accession #:    [id number]     Weight:       202.0 lb Date of Birth:  1936-12-01      BSA:          2.031 m Patient Age:    87 years       BP:           140/70 mmHg Patient Gender: M              HR:           96 bpm. Exam Location:  Inpatient  Procedure: 2D Echo, Cardiac Doppler, Color Doppler, Saline Contrast Bubble Study and Intracardiac Opacification Agent  Indications:    Stroke  History:        Patient has no prior history of Echocardiogram examinations. CAD; Risk Factors:Hypertension and Diabetes.  Sonographer:    Darlys Gales Referring Phys: [id number] BINAYA DAHAL  IMPRESSIONS   1. Left ventricular ejection fraction, by estimation, is 60 to 65%. The left ventricle has normal function. The left ventricle has no regional wall motion abnormalities. There is moderate concentric left ventricular hypertrophy. Left ventricular diastolic function could not be evaluated. 2. Right ventricular systolic function is normal. The right ventricular size is normal. Tricuspid regurgitation signal is inadequate for assessing PA pressure. 3. The mitral valve is degenerative. No evidence of mitral valve regurgitation. No evidence of mitral stenosis. Severe mitral annular calcification. 4. The aortic valve was not well visualized. Aortic valve regurgitation is not visualized. Aortic valve area, by VTI measures 1.22 cm. Aortic valve mean gradient measures 10.8 mmHg. Aortic valve Vmax measures 2.10 m/s. 5. Aortic dilatation noted. There is mild dilatation of the aortic root, measuring 43 mm. 6. The inferior vena cava is normal in size with greater than 50% respiratory variability, suggesting right atrial pressure of 3 mmHg. 7. Agitated saline contrast bubble study was negative, with no evidence of any interatrial shunt.  Conclusion(s)/Recommendation(s): No intracardiac source of embolism detected on this transthoracic study. Consider a  transesophageal echocardiogram to exclude cardiac source of embolism if clinically indicated.  FINDINGS Left Ventricle: Left ventricular ejection fraction, by estimation, is 60 to 65%. The left ventricle has normal function. The left ventricle has no regional wall motion abnormalities. Definity contrast agent was given IV to delineate the left ventricular endocardial borders. The left ventricular internal cavity size was normal in size. There is moderate concentric left ventricular hypertrophy. Left ventricular diastolic function could not be evaluated due to atrial fibrillation. Left ventricular diastolic function could not be evaluated.  Right Ventricle: The right ventricular size is normal. No increase in right ventricular wall thickness. Right ventricular systolic function is normal. Tricuspid regurgitation signal is inadequate for assessing PA pressure.  Left Atrium: Left atrial size was normal in size.  Right Atrium: Right atrial size was normal in size.  Pericardium: There is no evidence of pericardial effusion.  Mitral Valve: The mitral valve is degenerative in appearance. Severe mitral annular calcification. No evidence of mitral valve regurgitation. No evidence of mitral valve stenosis.  Tricuspid Valve: The tricuspid valve  is normal in structure. Tricuspid valve regurgitation is not demonstrated. No evidence of tricuspid stenosis.  Aortic Valve: The aortic valve was not well visualized. Aortic valve regurgitation is not visualized. Aortic valve mean gradient measures 10.8 mmHg. Aortic valve peak gradient measures 17.6 mmHg. Aortic valve area, by VTI measures 1.22 cm.  Pulmonic Valve: The pulmonic valve was normal in structure. Pulmonic valve regurgitation is not visualized. No evidence of pulmonic stenosis.  Aorta: Aortic dilatation noted. There is mild dilatation of the aortic root, measuring 43 mm.  Venous: The inferior vena cava is normal in size with greater than 50%  respiratory variability, suggesting right atrial pressure of 3 mmHg.  IAS/Shunts: No atrial level shunt detected by color flow Doppler. Agitated saline contrast was given intravenously to evaluate for intracardiac shunting. Agitated saline contrast bubble study was negative, with no evidence of any interatrial shunt.   LEFT VENTRICLE PLAX 2D LVIDd:         4.10 cm   Diastology LVIDs:         3.10 cm   LV e' medial:    6.09 cm/s LV PW:         1.50 cm   LV E/e' medial:  12.9 LV IVS:        1.30 cm   LV e' lateral:   8.16 cm/s LVOT diam:     2.00 cm   LV E/e' lateral: 9.6 LV SV:         42 LV SV Index:   21 LVOT Area:     3.14 cm   LEFT ATRIUM             Index LA Vol (A2C):   52.8 ml 26.00 ml/m LA Vol (A4C):   44.4 ml 21.86 ml/m LA Biplane Vol: 50.6 ml 24.92 ml/m AORTIC VALVE AV Area (Vmax):    1.15 cm AV Area (Vmean):   1.13 cm AV Area (VTI):     1.22 cm AV Vmax:           210.00 cm/s AV Vmean:          149.750 cm/s AV VTI:            0.346 m AV Peak Grad:      17.6 mmHg AV Mean Grad:      10.8 mmHg LVOT Vmax:         77.00 cm/s LVOT Vmean:        53.900 cm/s LVOT VTI:          0.134 m LVOT/AV VTI ratio: 0.39  AORTA Ao Root diam: 4.30 cm Ao Asc diam:  3.70 cm  MITRAL VALVE MV Area (PHT): 4.96 cm    SHUNTS MV Decel Time: 153 msec    Systemic VTI:  0.13 m MV E velocity: 78.40 cm/s  Systemic Diam: 2.00 cm  Armanda Magic MD Electronically signed by Armanda Magic MD Signature Date/Time: 11/02/2023/12:00:20 PM    Final          ______________________________________________________________________________________________        Current Reported Medications:.    Current Meds  Medication Sig   albuterol (PROVENTIL HFA) 108 (90 Base) MCG/ACT inhaler Inhale 2 puffs into the lungs every 4 (four) hours as needed for wheezing or shortness of breath.   colchicine 0.6 MG tablet Take 1 tablet (0.6 mg total) by mouth daily.   Cyanocobalamin (B-12 PO) Take 1,000  mcg by mouth daily.   cyclobenzaprine (FLEXERIL) 5 MG tablet Take 5 mg by mouth  daily as needed for muscle spasms.   ezetimibe (ZETIA) 10 MG tablet Take 10 mg by mouth daily.   famotidine (PEPCID) 20 MG tablet Take 20 mg by mouth 2 (two) times daily.   FARXIGA 10 MG TABS tablet Take 10 mg by mouth daily.   furosemide (LASIX) 20 MG tablet Take 20 mg by mouth daily.   ipratropium (ATROVENT) 0.06 % nasal spray Place 1 spray into the nose 3 (three) times daily as needed for rhinitis.   KERENDIA 10 MG TABS Take 10 mg by mouth daily.   meclizine (ANTIVERT) 25 MG tablet Take 25 mg by mouth 3 (three) times daily as needed for dizziness.   memantine (NAMENDA) 5 MG tablet Take 1 tablet  twice a day   metoprolol tartrate (LOPRESSOR) 25 MG tablet Take 0.5 tablets (12.5 mg total) by mouth 2 (two) times daily.   OVER THE COUNTER MEDICATION USES CPAP NIGHTLY   rosuvastatin (CRESTOR) 40 MG tablet Take 40 mg by mouth daily.   traMADol (ULTRAM) 50 MG tablet Take 50-100 mg by mouth See admin instructions. Take 50mg  (1 tablet) by mouth two to three times a day.   [DISCONTINUED] apixaban (ELIQUIS) 5 MG TABS tablet Take 1 tablet (5 mg total) by mouth 2 (two) times daily.   Physical Exam:    VS:  BP 124/60   Pulse (!) 52   Ht 5\' 6"  (1.676 m)   Wt 196 lb (88.9 kg)   SpO2 96%   BMI 31.64 kg/m    Wt Readings from Last 3 Encounters:  02/18/24 196 lb (88.9 kg)  12/23/23 191 lb 3.2 oz (86.7 kg)  12/02/23 189 lb 9.6 oz (86 kg)    GEN: Well nourished, well developed in no acute distress NECK: No JVD; No carotid bruits CARDIAC: RRR, no murmurs, rubs, gallops RESPIRATORY:  Clear to auscultation without rales, wheezing or rhonchi  ABDOMEN: Soft, non-tender, non-distended EXTREMITIES:  No edema; No acute deformity   Asessement and Plan:.    Persistent atrial fibrillation: Patient found to be in atrial fibrillation in 10/2023 in setting of acute CVA.  Started on Eliquis 5 mg twice daily.  He was referred to A-fib  clinic and underwent DCCV on 12/06/2023. Rate and rhythm is regular on auscultation today.  He denies any feelings of palpitations or increased heart rate, he denies any dizziness, lightheadedness, presyncope or syncope.  Patient reports that he feels very well.  He denies any bleeding problems on Eliquis.  Continue Eliquis 5 mg twice daily given CHA2DS2-VASc of at least 7 and metoprolol tartrate 12.5 mg twice daily.  CAD: S/p CABG x 2 in 2007 with LIMA to LAD, SVG to OM.  Low risk Myoview in 2012, nonischemic Myoview in 2014. Stable with no anginal symptoms. No indication for ischemic evaluation. Heart healthy diet and regular cardiovascular exercise encouraged.  Reviewed ED precautions. No longer on aspirin or Plavix given need for Eliquis.  Continue ezetimibe 10 mg daily, metoprolol tartrate 12.5 mg twice daily, rosuvastatin 40 mg daily.  OSA: Patient reports that he does not typically wear his CPAP at night. Encouraged to start wearing.   Hypertension: Blood pressure today 128/54, on recheck was 124/60. Continue current antihypertensive regimen.   CVA: Following with neurology.  Patient denies any residual effects.  HLD: Last lipid profile on 11/03/23 indicated total cholesterol 89, HDL 26, triglycerides 183 and LDL 26.  Continue rosuvastatin 40 mg daily.    Disposition: F/u with Dr. Allyson Sabal in three months or sooner if  needed.   Signed, Rip Harbour, NP

## 2024-02-16 NOTE — Patient Instructions (Signed)

## 2024-02-17 ENCOUNTER — Ambulatory Visit: Payer: Medicare Other | Admitting: Adult Health

## 2024-02-18 ENCOUNTER — Encounter: Payer: Self-pay | Admitting: Cardiology

## 2024-02-18 ENCOUNTER — Ambulatory Visit: Payer: Medicare Other | Attending: Cardiology | Admitting: Cardiology

## 2024-02-18 VITALS — BP 124/60 | HR 52 | Ht 66.0 in | Wt 196.0 lb

## 2024-02-18 DIAGNOSIS — I10 Essential (primary) hypertension: Secondary | ICD-10-CM

## 2024-02-18 DIAGNOSIS — I639 Cerebral infarction, unspecified: Secondary | ICD-10-CM | POA: Diagnosis not present

## 2024-02-18 DIAGNOSIS — I4819 Other persistent atrial fibrillation: Secondary | ICD-10-CM | POA: Diagnosis not present

## 2024-02-18 DIAGNOSIS — E782 Mixed hyperlipidemia: Secondary | ICD-10-CM

## 2024-02-18 DIAGNOSIS — G4733 Obstructive sleep apnea (adult) (pediatric): Secondary | ICD-10-CM

## 2024-02-18 DIAGNOSIS — I2510 Atherosclerotic heart disease of native coronary artery without angina pectoris: Secondary | ICD-10-CM

## 2024-02-18 DIAGNOSIS — D6869 Other thrombophilia: Secondary | ICD-10-CM

## 2024-02-18 MED ORDER — APIXABAN 5 MG PO TABS: 5.0000 mg | ORAL_TABLET | Freq: Two times a day (BID) | ORAL | 3 refills | Status: AC

## 2024-02-18 NOTE — Patient Instructions (Signed)
 Medication Instructions:  No changes *If you need a refill on your cardiac medications before your next appointment, please call your pharmacy*  Lab Work: No labs If you have labs (blood work) drawn today and your tests are completely normal, you will receive your results only by: MyChart Message (if you have MyChart) OR A paper copy in the mail If you have any lab test that is abnormal or we need to change your treatment, we will call you to review the results.  Testing/Procedures: No testing  Follow-Up: At [facility], you and your health needs are our priority.  As part of our continuing mission to provide you with exceptional heart care, we have created designated Provider Care Teams.  These Care Teams include your primary Cardiologist (physician) and Advanced Practice Providers (APPs -  Physician Assistants and Nurse Practitioners) who all work together to provide you with the care you need, when you need it.  We recommend signing up for the patient portal called "MyChart".  Sign up information is provided on this After Visit Summary.  MyChart is used to connect with patients for Virtual Visits (Telemedicine).  Patients are able to view lab/test results, encounter notes, upcoming appointments, etc.  Non-urgent messages can be sent to your provider as well.   To learn more about what you can do with MyChart, go to [email or web address].    Your next appointment:   3 month(s)  Provider:   Nanetta Batty, MD

## 2024-02-24 ENCOUNTER — Ambulatory Visit: Payer: Medicare Other | Admitting: Podiatry

## 2024-02-24 ENCOUNTER — Encounter: Payer: Self-pay | Admitting: Podiatry

## 2024-02-24 DIAGNOSIS — M7752 Other enthesopathy of left foot: Secondary | ICD-10-CM | POA: Diagnosis not present

## 2024-02-24 MED ORDER — TRIAMCINOLONE ACETONIDE 10 MG/ML IJ SUSP
5.0000 mg | Freq: Once | INTRAMUSCULAR | Status: AC
  Administered 2024-02-24: 5 mg via INTRAMUSCULAR

## 2024-02-28 NOTE — Progress Notes (Signed)
 Subjective: Chief Complaint  Patient presents with   Ankle Pain    RM#13 Follow up on left ankle pain patient states is better but not what it should be. Pain scale 2 today resting the foot keeps pain down.    88 year old male presents the office today for 2-week history of left ankle pain, swelling to the lateral ankle.  States he is doing better for swelling some discomfort.  No recent injury or changes otherwise.  He is on colchicine without any side effects.   Objective: AAO x3, NAD DP/PT pulses palpable bilaterally, CRT less than 3 seconds There is localized edema and erythema to the lateral aspect of the ankle with tenderness palpation of mostly along sinus tarsi but somewhat in the peroneal tendon.  Overall the edema is improved and the erythema seems to have resolved.  There is no area pinpoint tenderness.  Ankle, subtalar joint range of motion intact.  MMT 5/5. No pain with calf compression, swelling, warmth, erythema  Assessment: Capsulitis left ankle  Plan: -All treatment options discussed with the patient including all alternatives, risks, complications.  -Reviewed blood work. -Steroid injection performed today with sinus tarsi.  Cleaning skin with Betadine, alcohol.  Mixture 1 cc Kenalog 10, 0.5 cc of Marcaine plain, 0.5 cc of lidocaine plain was infiltrated into the sinus tarsi without complications.  Postinjection care discussed.  Tolerated well.  No follow-ups on file.  Vivi Barrack DPM

## 2024-03-01 DIAGNOSIS — D0461 Carcinoma in situ of skin of right upper limb, including shoulder: Secondary | ICD-10-CM | POA: Diagnosis not present

## 2024-03-01 DIAGNOSIS — Z85828 Personal history of other malignant neoplasm of skin: Secondary | ICD-10-CM | POA: Diagnosis not present

## 2024-03-01 DIAGNOSIS — D044 Carcinoma in situ of skin of scalp and neck: Secondary | ICD-10-CM | POA: Diagnosis not present

## 2024-03-01 DIAGNOSIS — L570 Actinic keratosis: Secondary | ICD-10-CM | POA: Diagnosis not present

## 2024-03-01 DIAGNOSIS — L821 Other seborrheic keratosis: Secondary | ICD-10-CM | POA: Diagnosis not present

## 2024-03-01 DIAGNOSIS — D692 Other nonthrombocytopenic purpura: Secondary | ICD-10-CM | POA: Diagnosis not present

## 2024-03-07 DIAGNOSIS — E7800 Pure hypercholesterolemia, unspecified: Secondary | ICD-10-CM | POA: Diagnosis not present

## 2024-03-07 DIAGNOSIS — N1831 Chronic kidney disease, stage 3a: Secondary | ICD-10-CM | POA: Diagnosis not present

## 2024-03-07 DIAGNOSIS — I4891 Unspecified atrial fibrillation: Secondary | ICD-10-CM | POA: Diagnosis not present

## 2024-03-07 DIAGNOSIS — E1169 Type 2 diabetes mellitus with other specified complication: Secondary | ICD-10-CM | POA: Diagnosis not present

## 2024-03-07 DIAGNOSIS — I10 Essential (primary) hypertension: Secondary | ICD-10-CM | POA: Diagnosis not present

## 2024-03-09 ENCOUNTER — Ambulatory Visit: Payer: Medicare Other | Admitting: Podiatry

## 2024-03-09 ENCOUNTER — Encounter: Payer: Self-pay | Admitting: Podiatry

## 2024-03-09 DIAGNOSIS — B351 Tinea unguium: Secondary | ICD-10-CM | POA: Diagnosis not present

## 2024-03-09 DIAGNOSIS — Z7901 Long term (current) use of anticoagulants: Secondary | ICD-10-CM

## 2024-03-09 DIAGNOSIS — M79674 Pain in right toe(s): Secondary | ICD-10-CM | POA: Diagnosis not present

## 2024-03-09 DIAGNOSIS — M79675 Pain in left toe(s): Secondary | ICD-10-CM

## 2024-03-09 DIAGNOSIS — L84 Corns and callosities: Secondary | ICD-10-CM | POA: Diagnosis not present

## 2024-03-12 NOTE — Progress Notes (Signed)
 Subjective: Chief Complaint  Patient presents with   [facility]    RM#64 DFC     88 year old male presents the office today for concerns of thick, elongated nails not able to trim himself.  No open lesions.  No injuries.  The ankle is doing better.  He only gets discomfort if he were to bump it.  He did well with the injection and that was helpful.  He is on Eliquis   Objective: AAO x3, NAD DP/PT pulses palpable bilaterally, CRT less than 3 seconds Nails are hypertrophic, dystrophic, brittle, discolored, elongated 10. No surrounding redness or drainage. Tenderness nails 1-5 bilaterally. No open lesions or pre-ulcerative lesions are identified today. Hyperkeratotic lesion b/l sub 5 without any underlying ulceration, drainage or signs of infection. There is slight tenderness palpation along sinus tarsi, lateral aspect ankle but there is no area pinpoint tenderness on left side.  There is decreased edema.  No erythema or warmth.  No open lesions. No pain with calf compression, swelling, warmth, erythema  Assessment: Symptomatic onychomycosis, hyperkeratotic lesions x 2, on Eliquis; ankle pain  Plan: -All treatment options discussed with the patient including all alternatives, risks, complications.  -Toenails sharply debrided x 10 without any complications or bleeding. -Sharply debrided hyperkeratotic lesion  x 2 without any complications.  Moisturizer. -Ankle is doing better.  Discussed repeat steroid injection if needed.  Continue icing as well as topical creams to help with discomfort.  Continue supportive shoe gear. -Patient encouraged to call the office with any questions, concerns, change in symptoms.   Return in about 3 months (around 06/09/2024).  Vivi Barrack DPM

## 2024-03-15 DIAGNOSIS — R059 Cough, unspecified: Secondary | ICD-10-CM | POA: Diagnosis not present

## 2024-04-10 ENCOUNTER — Telehealth: Payer: Self-pay | Admitting: Podiatry

## 2024-04-10 NOTE — Telephone Encounter (Signed)
 Will pay $25.18 bill on next appt 6/20.

## 2024-04-27 ENCOUNTER — Ambulatory Visit: Payer: Medicare Other | Admitting: Orthopedic Surgery

## 2024-04-27 ENCOUNTER — Encounter: Payer: Self-pay | Admitting: Orthopedic Surgery

## 2024-04-27 DIAGNOSIS — M170 Bilateral primary osteoarthritis of knee: Secondary | ICD-10-CM | POA: Diagnosis not present

## 2024-04-27 DIAGNOSIS — I87331 Chronic venous hypertension (idiopathic) with ulcer and inflammation of right lower extremity: Secondary | ICD-10-CM

## 2024-04-27 MED ORDER — LIDOCAINE HCL 1 % IJ SOLN
5.0000 mL | INTRAMUSCULAR | Status: AC | PRN
Start: 2024-04-27 — End: 2024-04-27
  Administered 2024-04-27: 5 mL

## 2024-04-27 MED ORDER — METHYLPREDNISOLONE ACETATE 40 MG/ML IJ SUSP
40.0000 mg | INTRAMUSCULAR | Status: AC | PRN
Start: 2024-04-27 — End: 2024-04-27
  Administered 2024-04-27: 40 mg via INTRA_ARTICULAR

## 2024-04-27 MED ORDER — METHYLPREDNISOLONE ACETATE 40 MG/ML IJ SUSP
40.0000 mg | INTRAMUSCULAR | Status: AC | PRN
  Administered 2024-04-27: 40 mg via INTRA_ARTICULAR

## 2024-04-27 MED ORDER — LIDOCAINE HCL 1 % IJ SOLN
5.0000 mL | INTRAMUSCULAR | Status: AC | PRN
  Administered 2024-04-27: 5 mL

## 2024-04-27 NOTE — Progress Notes (Signed)
 Office Visit Note   Patient: Rick Melendez           Date of Birth: 1936-02-07           MRN: [id number] Visit Date: 04/27/2024              Requested by: Imelda Man, MD [address] [place],  [place] [address] PCP: Imelda Man, MD  Chief Complaint  Patient presents with   Right Knee - Follow-up   Left Knee - Follow-up      HPI: Patient is an 88 year old gentleman who presents with 2 separate issues.  #1 osteoarthritis both knees.  Patient has had good relief with an injection 3 months ago.  He states he started having recurrent pain in both knees.  Patient states he also recently fell out of bed sustaining a traumatic venous stasis ulcer to the right medial calf.  Assessment & Plan: Visit Diagnoses:  1. Bilateral primary osteoarthritis of knee   2. Chronic venous hypertension (idiopathic) with ulcer and inflammation of right lower extremity (HCC)     Plan: Both knees were injected he tolerated this well.  Patient will proceed with Dial soap cleansing and dry dressing changes daily.  Discussed that if we have further wound dehiscence we would need to proceed with surgical or office-based tissue graft.  Follow-Up Instructions: Return in about 1 week (around 05/04/2024).   Ortho Exam  Patient is alert, oriented, no adenopathy, well-dressed, normal affect, normal respiratory effort. Examination patient has brawny skin color changes in the right leg.  He has a traumatic venous ulcer of the medial aspect with hematoma.  The traumatic wound is 3 x 6 cm.  The skin is grossly intact at this time.  Patient is on Eliquis .  Examination of both knees patient has crepitation with range of motion there is no effusion.  Collaterals and cruciates are stable.  Imaging: No results found. No images are attached to the encounter.  Labs: Lab Results  Component Value Date   HGBA1C 6.9 (H) 11/01/2023   ESRSEDRATE 6 02/10/2024   CRP <3.0 02/10/2024   LABURIC 5.2 02/10/2024    REPTSTATUS 03/24/2017 FINAL 03/19/2017   CULT NO GROWTH 5 DAYS 03/19/2017     Lab Results  Component Value Date   ALBUMIN 3.3 (L) 11/01/2023    Lab Results  Component Value Date   MG 1.5 (L) 03/19/2017   No results found for: "VD25OH"  No results found for: "PREALBUMIN"    Latest Ref Rng & Units 02/10/2024    1:48 PM 12/02/2023   12:51 PM 11/03/2023    4:27 AM  CBC EXTENDED  WBC 3.8 - 10.8 Thousand/uL 10.8  10.2  11.5   RBC 4.20 - 5.80 Million/uL 5.55  5.73  5.87   Hemoglobin 13.2 - 17.1 g/dL 19.1  47.8  29.5   HCT 38.5 - 50.0 % 46.2  47.9  48.1   Platelets 140 - 400 Thousand/uL 230  315  231   NEUT# 1,500 - 7,800 cells/uL 6,491        There is no height or weight on file to calculate BMI.  Orders:  No orders of the defined types were placed in this encounter.  No orders of the defined types were placed in this encounter.    Procedures: Large Joint Inj: bilateral knee on 04/27/2024 11:51 AM Indications: pain and diagnostic evaluation Details: 22 G 1.5 in needle, anteromedial approach  Arthrogram: No  Medications (Right): 5 mL lidocaine   1 %; 40 mg methylPREDNISolone  acetate 40 MG/ML Medications (Left): 5 mL lidocaine  1 %; 40 mg methylPREDNISolone  acetate 40 MG/ML Outcome: tolerated well, no immediate complications Procedure, treatment alternatives, risks and benefits explained, specific risks discussed. Consent was given by the patient. Immediately prior to procedure a time out was called to verify the correct patient, procedure, equipment, support staff and site/side marked as required. Patient was prepped and draped in the usual sterile fashion.      Clinical Data: No additional findings.  ROS:  All other systems negative, except as noted in the HPI. Review of Systems  Objective: Vital Signs: There were no vitals taken for this visit.  Specialty Comments:  EXAM: MRI LUMBAR SPINE WITHOUT CONTRAST   TECHNIQUE: Multiplanar, multisequence MR imaging  of the lumbar spine was performed. No intravenous contrast was administered.   COMPARISON:  Lumbar MRI 05/27/2014   FINDINGS: Segmentation:  Normal.  Lowest disc space L5-S1   Alignment: Mild anterolisthesis T12-L1. 4 mm retrolisthesis L1-2. Slight retrolisthesis L3-4. 6 mm anterolisthesis L4-5. Mild retrolisthesis L5-S1.   Vertebrae:  Normal bone marrow.  Negative for fracture or mass.   Conus medullaris and cauda equina: Conus extends to the L1-2 level. Conus and cauda equina appear normal.   Paraspinal and other soft tissues: Negative for mass or adenopathy. No soft tissue edema or fluid collection.   Disc levels:   T12-L1: Advanced disc degeneration left greater than right. Prominent left-sided spurring. Bilateral facet degeneration. Moderate spinal stenosis. Severe subarticular and foraminal stenosis on the left. Mild subarticular stenosis on the right. Mild progression of stenosis on the left since the prior study   L1-2: Advanced disc degeneration with diffuse endplate spurring and marked disc space narrowing. Mild facet degeneration. Mild to moderate spinal stenosis. Moderate subarticular stenosis bilaterally. No interval change   L2-3: Disc degeneration with diffuse disc bulging and endplate spurring. Mild facet degeneration. Mild subarticular stenosis bilaterally with mild progression   L3-4: Disc degeneration with diffuse disc bulging and spurring, right greater than left. Mild facet degeneration. Mild spinal stenosis. Moderate to severe right subarticular stenosis. Moderate left subarticular stenosis. No significant change   L4-5: 6 mm anterolisthesis with severe facet degeneration and diffuse bulging of the disc. Severe spinal stenosis and severe subarticular stenosis bilaterally have progressed since the prior study. In addition, there is severe right foraminal encroachment with right L4 nerve root impingement and moderate left foraminal encroachment.    L5-S1: Left paracentral disc protrusion with compression of the left S1 nerve root similar to the prior study. Mild facet degeneration bilaterally.   IMPRESSION: Advanced multilevel degenerative change throughout the lumbar spine. There has been progression of degenerative changes stenosis at multiple levels as described above   6 mm anterolisthesis L4-5. Severe spinal and severe subarticular stenosis bilaterally have progressed in the interval. Severe right foraminal encroachment.     Electronically Signed   By: Anastasio Balsam M.D.   On: 08/22/2020 21:23  PMFS History: Patient Active Problem List   Diagnosis Date Noted   Persistent atrial fibrillation (HCC) 12/02/2023   Hypercoagulable state due to persistent atrial fibrillation (HCC) 12/02/2023   Acute stroke due to ischemia (HCC) 11/02/2023   DOE (dyspnea on exertion) 08/11/2023   Upper airway cough syndrome 08/11/2023   Slow heart rate 04/14/2022   Influenza B    AKI (acute kidney injury) (HCC)    CAP (community acquired pneumonia) 03/19/2017   Bilateral primary osteoarthritis of knee 01/28/2017   Impingement syndrome of right shoulder 01/28/2017  CAD - CABG X 2 9/07 LIMA-LAD, SVG-OM. Low risk Myoview 7/12 09/07/2013   Essential hypertension 09/07/2013   Dyslipidemia 09/07/2013   Diabetes mellitus (HCC) 09/07/2013   Chronic renal insufficiency, stage III (moderate)- SCr 1.6 (Aug 2013) 09/07/2013   Sleep apnea- compliant with C-pap 09/07/2013   PVD (peripheral vascular disease)- Rt iliac disease by doppler 7/13 09/07/2013   Past Medical History:  Diagnosis Date   Arthritis    Coronary artery disease 2007   cabg x3   Diabetes mellitus without complication (HCC)    Hyperlipidemia    Hypertension    Influenza B 03/19/2017   OSA on CPAP    Dr Loetta Ringer - follows and treatmentof sleep apnea   PVD (peripheral vascular disease) (HCC) 2003   DR BERRY-  -LEFT LEG STENTING    Family History  Problem Relation Age of  Onset   Cancer Father    Cancer Brother     Past Surgical History:  Procedure Laterality Date   CARDIAC CATHETERIZATION  0911/2007   3 vessel CAD WITH LEFT MAIN CAD SEVERE ,norm lLIMA,LV nom. ,evidence for very mild aortic stenosis with gradient, mild stenosis distal abdnminal aotra and proximal left common iliac artery 30%   CARDIOVERSION N/A 12/06/2023   Procedure: CARDIOVERSION;  Surgeon: Lenise Quince, MD;  Location: [facility];  Service: Cardiovascular;  Laterality: N/A;   CORONARY ARTERY BYPASS GRAFT  08/31/2006   DR Felice Hora to LAD, VEIN TO AN OBTUSE MARGINAL BRANCH AND  RIGHT CORONARY ARTERY   CPET  04/13/2012   normal   DOPPLER ECHOCARDIOGRAPHY  07/08/2011   EF =>55%   lower arterial doppler  07/01/2012   mildly abnormal lower extremity;ABIs >1 bilaterally with moderately high-grade right iliac stenosis that had not changed from proir study.   NM MYOCAR PERF WALL MOTION  7/182012   EF 59%, normal myocardial perfusio   Social History   Occupational History   Not on file  Tobacco Use   Smoking status: Former    Current packs/day: 0.00    Types: Cigarettes    Quit date: 09/03/1981    Years since quitting: 42.6   Smokeless tobacco: Current    Types: Chew  Substance and Sexual Activity   Alcohol use: Yes   Drug use: No   Sexual activity: Not on file

## 2024-05-04 ENCOUNTER — Other Ambulatory Visit (INDEPENDENT_AMBULATORY_CARE_PROVIDER_SITE_OTHER): Payer: Self-pay

## 2024-05-04 ENCOUNTER — Ambulatory Visit: Payer: Self-pay | Admitting: Orthopedic Surgery

## 2024-05-04 DIAGNOSIS — S8012XA Contusion of left lower leg, initial encounter: Secondary | ICD-10-CM | POA: Diagnosis not present

## 2024-05-04 DIAGNOSIS — M79661 Pain in right lower leg: Secondary | ICD-10-CM

## 2024-05-05 ENCOUNTER — Encounter: Payer: Self-pay | Admitting: Orthopedic Surgery

## 2024-05-05 NOTE — Progress Notes (Signed)
 Office Visit Note   Patient: Rick Melendez           Date of Birth: November 24, 1983           MRN: [id number] Visit Date: 05/04/2024              Requested by: Imelda Man, MD [address] [place],  [place] [address] PCP: Imelda Man, MD  Chief Complaint  Patient presents with   Right Leg - Follow-up   Left Knee - Follow-up    S/p injection 04/27/2024 bilateral knees    Right Knee - Follow-up      HPI: Patient is an 88 year old gentleman who is on Eliquis  for A-fib.  Patient states he fell out of bed and hit his leg on the nightstand on May 7.  Patient complains of pain redness and swelling.  Assessment & Plan: Visit Diagnoses:  1. Pain in right lower leg   2. Traumatic hematoma of left lower leg, initial encounter     Plan: Will apply Dynaflex compression wrap reevaluate in 1 week.  Discussed risk of skin breakdown, ulceration, and need for surgical debridement.  Follow-Up Instructions: Return in about 1 week (around 05/11/2024).   Ortho Exam  Patient is alert, oriented, no adenopathy, well-dressed, normal affect, normal respiratory effort. Examination patient has a biphasic anterior tibial and posterior tibial pulse.  He is tender to palpation over the tibial crest radiograph shows no fractures.  He has a large hematoma and ulceration medial to the left calf.  There is no drainage no cellulitis.  Imaging: No results found.   Labs: Lab Results  Component Value Date   HGBA1C 6.9 (H) 11/01/2023   ESRSEDRATE 6 02/10/2024   CRP <3.0 02/10/2024   LABURIC 5.2 02/10/2024   REPTSTATUS 03/24/2017 FINAL 03/19/2017   CULT NO GROWTH 5 DAYS 03/19/2017     Lab Results  Component Value Date   ALBUMIN 3.3 (L) 11/01/2023    Lab Results  Component Value Date   MG 1.5 (L) 03/19/2017   No results found for: "VD25OH"  No results found for: "PREALBUMIN"    Latest Ref Rng & Units 02/10/2024    1:48 PM 12/02/2023   12:51 PM 11/03/2023    4:27 AM  CBC  EXTENDED  WBC 3.8 - 10.8 Thousand/uL 10.8  10.2  11.5   RBC 4.20 - 5.80 Million/uL 5.55  5.73  5.87   Hemoglobin 13.2 - 17.1 g/dL 95.6  21.3  08.6   HCT 38.5 - 50.0 % 46.2  47.9  48.1   Platelets 140 - 400 Thousand/uL 230  315  231   NEUT# 1,500 - 7,800 cells/uL 6,491        There is no height or weight on file to calculate BMI.  Orders:  Orders Placed This Encounter  Procedures   XR Tibia/Fibula Right   No orders of the defined types were placed in this encounter.    Procedures: No procedures performed  Clinical Data: No additional findings.  ROS:  All other systems negative, except as noted in the HPI. Review of Systems  Objective: Vital Signs: There were no vitals taken for this visit.  Specialty Comments:  EXAM: MRI LUMBAR SPINE WITHOUT CONTRAST   TECHNIQUE: Multiplanar, multisequence MR imaging of the lumbar spine was performed. No intravenous contrast was administered.   COMPARISON:  Lumbar MRI 05/27/2014   FINDINGS: Segmentation:  Normal.  Lowest disc space L5-S1   Alignment: Mild anterolisthesis T12-L1. 4 mm retrolisthesis L1-2.  Slight retrolisthesis L3-4. 6 mm anterolisthesis L4-5. Mild retrolisthesis L5-S1.   Vertebrae:  Normal bone marrow.  Negative for fracture or mass.   Conus medullaris and cauda equina: Conus extends to the L1-2 level. Conus and cauda equina appear normal.   Paraspinal and other soft tissues: Negative for mass or adenopathy. No soft tissue edema or fluid collection.   Disc levels:   T12-L1: Advanced disc degeneration left greater than right. Prominent left-sided spurring. Bilateral facet degeneration. Moderate spinal stenosis. Severe subarticular and foraminal stenosis on the left. Mild subarticular stenosis on the right. Mild progression of stenosis on the left since the prior study   L1-2: Advanced disc degeneration with diffuse endplate spurring and marked disc space narrowing. Mild facet degeneration. Mild  to moderate spinal stenosis. Moderate subarticular stenosis bilaterally. No interval change   L2-3: Disc degeneration with diffuse disc bulging and endplate spurring. Mild facet degeneration. Mild subarticular stenosis bilaterally with mild progression   L3-4: Disc degeneration with diffuse disc bulging and spurring, right greater than left. Mild facet degeneration. Mild spinal stenosis. Moderate to severe right subarticular stenosis. Moderate left subarticular stenosis. No significant change   L4-5: 6 mm anterolisthesis with severe facet degeneration and diffuse bulging of the disc. Severe spinal stenosis and severe subarticular stenosis bilaterally have progressed since the prior study. In addition, there is severe right foraminal encroachment with right L4 nerve root impingement and moderate left foraminal encroachment.   L5-S1: Left paracentral disc protrusion with compression of the left S1 nerve root similar to the prior study. Mild facet degeneration bilaterally.   IMPRESSION: Advanced multilevel degenerative change throughout the lumbar spine. There has been progression of degenerative changes stenosis at multiple levels as described above   6 mm anterolisthesis L4-5. Severe spinal and severe subarticular stenosis bilaterally have progressed in the interval. Severe right foraminal encroachment.     Electronically Signed   By: Anastasio Balsam M.D.   On: 08/22/2020 21:23  PMFS History: Patient Active Problem List   Diagnosis Date Noted   Persistent atrial fibrillation (HCC) 12/02/2023   Hypercoagulable state due to persistent atrial fibrillation (HCC) 12/02/2023   Acute stroke due to ischemia (HCC) 11/02/2023   DOE (dyspnea on exertion) 08/11/2023   Upper airway cough syndrome 08/11/2023   Slow heart rate 04/14/2022   Influenza B    AKI (acute kidney injury) (HCC)    CAP (community acquired pneumonia) 03/19/2017   Bilateral primary osteoarthritis of knee  01/28/2017   Impingement syndrome of right shoulder 01/28/2017   CAD - CABG X 2 9/07 LIMA-LAD, SVG-OM. Low risk Myoview 7/12 09/07/2013   Essential hypertension 09/07/2013   Dyslipidemia 09/07/2013   Diabetes mellitus (HCC) 09/07/2013   Chronic renal insufficiency, stage III (moderate)- SCr 1.6 (Aug 2013) 09/07/2013   Sleep apnea- compliant with C-pap 09/07/2013   PVD (peripheral vascular disease)- Rt iliac disease by doppler 7/13 09/07/2013   Past Medical History:  Diagnosis Date   Arthritis    Coronary artery disease 2007   cabg x3   Diabetes mellitus without complication (HCC)    Hyperlipidemia    Hypertension    Influenza B 03/19/2017   OSA on CPAP    Dr Loetta Ringer - follows and treatmentof sleep apnea   PVD (peripheral vascular disease) (HCC) 2003   DR BERRY-  -LEFT LEG STENTING    Family History  Problem Relation Age of Onset   Cancer Father    Cancer Brother     Past Surgical History:  Procedure Laterality Date  CARDIAC CATHETERIZATION  0911/2007   3 vessel CAD WITH LEFT MAIN CAD SEVERE ,norm lLIMA,LV nom. ,evidence for very mild aortic stenosis with gradient, mild stenosis distal abdnminal aotra and proximal left common iliac artery 30%   CARDIOVERSION N/A 12/06/2023   Procedure: CARDIOVERSION;  Surgeon: Lenise Quince, MD;  Location: [facility];  Service: Cardiovascular;  Laterality: N/A;   CORONARY ARTERY BYPASS GRAFT  08/31/2006   DR Felice Hora to LAD, VEIN TO AN OBTUSE MARGINAL BRANCH AND  RIGHT CORONARY ARTERY   CPET  04/13/2012   normal   DOPPLER ECHOCARDIOGRAPHY  07/08/2011   EF =>55%   lower arterial doppler  07/01/2012   mildly abnormal lower extremity;ABIs >1 bilaterally with moderately high-grade right iliac stenosis that had not changed from proir study.   NM MYOCAR PERF WALL MOTION  7/182012   EF 59%, normal myocardial perfusio   Social History   Occupational History   Not on file  Tobacco Use   Smoking status: Former    Current  packs/day: 0.00    Types: Cigarettes    Quit date: 09/03/1981    Years since quitting: 42.6   Smokeless tobacco: Current    Types: Chew  Substance and Sexual Activity   Alcohol use: Yes   Drug use: No   Sexual activity: Not on file

## 2024-05-08 ENCOUNTER — Ambulatory Visit: Admitting: Physician Assistant

## 2024-05-08 NOTE — Progress Notes (Incomplete)
 Assessment/Plan:    Mild cognitive impairment   Rick Melendez is a very pleasant 88 y.o. RH male with a history of hypertension, hyperlipidemia, DM2, osteoarthritis with multiple surgeries, mild anemia, hard of hearing, OSA on CPAP after weight loss, CAD, Afib status post cardioversion now on sinus rhythm, small right paramedian pons subacute infarct November 2024 on Eliquis  5 mg twice daily, hypoproteinemia related diagnosis of mild cognitive impairment (**presenting today in follow-up for evaluation of memory loss. Patient is on memantine  5 mg twice daily, tolerating well.   ***.     Recommendations:   Follow up in   months. Continue CPAP for OSA Continue memantine  5 mg twice daily, side effects discussed Recommend good control of cardiovascular risk factors, secondary stroke prevention follow-up with cardiology.  Continue Eliquis  5 mg twice daily  Recommend using hearing aids in an effort to improve comprehension Continue to control mood as per PCP Continue home PT      Subjective:   This patient is accompanied in the office by his wife***  who supplements the history. Previous records as well as any outside records available were reviewed prior to todays visit.   Patient was last seen on 11/12/2023, last MoCA on July 2024 was 27/30***.    Any changes in memory since last visit? ".  He does not like doing any brain exercises.  He enjoys going to church repeats oneself?  Endorsed Disoriented when walking into a room?  Patient denies ***  Misplacing objects?  Patient denies   Wandering behavior?   Denies. Any personality changes since last visit? Denies.   Any worsening depression?: denies.   Hallucinations or paranoia?  Denies.   Seizures?   Denies.    Any sleep changes? Sleeps well***. Does not sleep very well***.   Denies vivid dreams, REM behavior or sleepwalking   Sleep apnea?   denies ***  Any hygiene concerns?   Denies.   Independent of bathing and dressing?   Endorsed  Does the patient needs help with medications?  Wife is in charge *** Who is in charge of the finances?  Wife has always been in charge   *** Any changes in appetite?  denies ***   Patient have trouble swallowing?  Denies.   Does the patient cook?  Any kitchen accidents such as leaving the stove on?   Denies.   Any headaches?    Denies.   Vision changes? Denies. Chronic pain?  Endorsed, she has chronic back pain due to advanced multilevel DJD with severe L-spine stenosis, receives back injections. Ambulates with difficulty?  Uses a walker for stability.***  Recent falls or head injuries?    Denies.      Unilateral weakness, numbness or tingling?  Denies.   Any tremors?  Denies.   Any anosmia?    Denies.   Any incontinence of urine?  Yes he reports incontinence. Any bowel dysfunction?  Denies.      Patient lives with his wife.*** Does the patient drive?  Yes, he denies getting lost or any other issues***   Initial evaluation 10/02/2022  How long did patient have memory difficulties? "I didn't notice anything".  Wife "is not sure that he has significant memory changes, the doctor sent me here".  Disoriented when walking into a room?  Patient denies   Leaving objects in unusual places?  Patient denies   Patient lives wife Ambulates  with difficulty? Uses a walker and a cane for stability due to arthritis  Recent  falls?  Fell at the end of July and uses a walker. ONnPT/OT weekly  Any head injuries?  Patient denies   History of seizures?   Patient denies   Wandering behavior?  Patient denies   Patient drives? No issues driving  Any mood changes ?  Denies  Any depression?:  Patient denies  "I like me too good!" Hallucinations?  Patient denies now, but when" I was on Ozempic he did, once off I was fine". Paranoia?  Patient denies   Patient reports that sleeps well without vivid dreams, REM behavior or sleepwalking    History of sleep apnea? Endorsed, but after 40 lb weight loss  "I no longer need CPAP"" Any hygiene concerns?  Patient denies   Independent of bathing and dressing?  Endorsed  Does the patient needs help with medications? He is in charge  Who is in charge of the finances? Wife has always been is in charge   Any changes in appetite?  Patient denies   Patient have trouble swallowing? Patient denies   Does the patient cook?  Patient denies   Any kitchen accidents such as leaving the stove on? Patient denies   Any headaches?  Patient denies   Double vision? Patient denies   Any focal numbness or tingling?  Patient denies   Chronic back pain: Endorsed, s/p falls and several surgeries shoulder, C spine Unilateral weakness?  Patient denies   Any tremors?  Patient denies   Any history of anosmia?  Patient denies   Any incontinence of urine?  Patient denies   Any bowel dysfunction?   Patient denies   History of heavy alcohol intake?  Patient denies   History of heavy tobacco use?  Patient denies   Family history of dementia?   Denies     Retired plumber   MRI brain report 09/22/22 There is mild diffuse cerebral atrophy. No hippocampal atrophy.     MRI brain personally reviewed 11/02/23 small acute R paramedian pons infarct, mild diffuse cerebral atrophy   Past Medical History:  Diagnosis Date   Arthritis    Coronary artery disease 2007   cabg x3   Diabetes mellitus without complication (HCC)    Hyperlipidemia    Hypertension    Influenza B 03/19/2017   OSA on CPAP    Dr Loetta Ringer - follows and treatmentof sleep apnea   PVD (peripheral vascular disease) (HCC) 2003   DR BERRY-  -LEFT LEG STENTING     Past Surgical History:  Procedure Laterality Date   CARDIAC CATHETERIZATION  0911/2007   3 vessel CAD WITH LEFT MAIN CAD SEVERE ,norm lLIMA,LV nom. ,evidence for very mild aortic stenosis with gradient, mild stenosis distal abdnminal aotra and proximal left common iliac artery 30%   CARDIOVERSION N/A 12/06/2023   Procedure: CARDIOVERSION;  Surgeon:  Lenise Quince, MD;  Location: [facility];  Service: Cardiovascular;  Laterality: N/A;   CORONARY ARTERY BYPASS GRAFT  08/31/2006   DR Felice Hora to LAD, VEIN TO AN OBTUSE MARGINAL BRANCH AND  RIGHT CORONARY ARTERY   CPET  04/13/2012   normal   DOPPLER ECHOCARDIOGRAPHY  07/08/2011   EF =>55%   lower arterial doppler  07/01/2012   mildly abnormal lower extremity;ABIs >1 bilaterally with moderately high-grade right iliac stenosis that had not changed from proir study.   NM MYOCAR PERF WALL MOTION  7/182012   EF 59%, normal myocardial perfusio     PREVIOUS MEDICATIONS:   CURRENT MEDICATIONS:  Outpatient Encounter Medications as  of 05/08/2024  Medication Sig   albuterol  (PROVENTIL  HFA) 108 (90 Base) MCG/ACT inhaler Inhale 2 puffs into the lungs every 4 (four) hours as needed for wheezing or shortness of breath.   apixaban  (ELIQUIS ) 5 MG TABS tablet Take 1 tablet (5 mg total) by mouth 2 (two) times daily.   colchicine  0.6 MG tablet Take 1 tablet (0.6 mg total) by mouth daily.   Cyanocobalamin (B-12 PO) Take 1,000 mcg by mouth daily.   cyclobenzaprine (FLEXERIL) 5 MG tablet Take 5 mg by mouth daily as needed for muscle spasms.   ezetimibe  (ZETIA ) 10 MG tablet Take 10 mg by mouth daily.   famotidine (PEPCID) 20 MG tablet Take 20 mg by mouth 2 (two) times daily.   FARXIGA 10 MG TABS tablet Take 10 mg by mouth daily.   furosemide (LASIX) 20 MG tablet Take 20 mg by mouth daily.   ipratropium (ATROVENT) 0.06 % nasal spray Place 1 spray into the nose 3 (three) times daily as needed for rhinitis.   KERENDIA 10 MG TABS Take 10 mg by mouth daily.   meclizine (ANTIVERT) 25 MG tablet Take 25 mg by mouth 3 (three) times daily as needed for dizziness.   memantine  (NAMENDA ) 5 MG tablet Take 1 tablet  twice a day   metoprolol  tartrate (LOPRESSOR ) 25 MG tablet Take 0.5 tablets (12.5 mg total) by mouth 2 (two) times daily.   OVER THE COUNTER MEDICATION USES CPAP NIGHTLY   rosuvastatin  (CRESTOR )  40 MG tablet Take 40 mg by mouth daily.   traMADol (ULTRAM) 50 MG tablet Take 50-100 mg by mouth See admin instructions. Take 50mg  (1 tablet) by mouth two to three times a day.   No facility-administered encounter medications on file as of 05/08/2024.     Objective:     PHYSICAL EXAMINATION:    VITALS:  There were no vitals filed for this visit.  GEN:  The patient appears stated age and is in NAD. HEENT:  Normocephalic, atraumatic.   Neurological examination:  General: NAD, well-groomed, appears stated age. Orientation: The patient is alert. Oriented to person, place and not to date.*** Cranial nerves: There is chronic mild right facial droop.The speech is fluent and clear. No aphasia or dysarthria. Fund of knowledge is appropriate. Recent memory impaired and remote memory is normal.  Attention and concentration are normal.  Able to name objects and repeat phrases.  Hearing is intact to conversational tone ***.   Delayed recall *** Sensation: Sensation is intact to light touch throughout Motor: Strength is at least antigravity x4. DTR's 2/4 in UE/LE      10/02/2022   10:00 AM  Montreal Cognitive Assessment   Visuospatial/ Executive (0/5) 1  Naming (0/3) 3  Attention: Read list of digits (0/2) 2  Attention: Read list of letters (0/1) 1  Attention: Serial 7 subtraction starting at 100 (0/3) 3  Language: Repeat phrase (0/2) 1  Language : Fluency (0/1) 0  Abstraction (0/2) 0  Delayed Recall (0/5) 4  Orientation (0/6) 5  Total 20  Adjusted Score (based on education) 20       11/12/2023   12:00 PM 07/16/2023   11:00 AM  MMSE - Mini Mental State Exam  Orientation to time 5 4  Orientation to Place 5 5  Registration 3 3  Attention/ Calculation 5 5  Recall 2 2  Language- name 2 objects 2 2  Language- repeat 1 1  Language- follow 3 step command 3 3  Language- read & follow  direction 1 1  Write a sentence 1 1  Copy design 1 0  Total score 29 27       Movement  examination: Tone: There is normal tone in the UE/LE Abnormal movements:  no tremor.  No myoclonus.  No asterixis.   Coordination:  There is no decremation with RAM's. Normal finger to nose  Gait and Station: The patient has no difficulty arising out of a deep-seated chair without the use of the hands. The patient's stride length is good.  Gait is cautious and narrow.   Thank you for allowing us  the opportunity to participate in the care of this nice patient. Please do not hesitate to contact us  for any questions or concerns.   Total time spent on today's visit was *** minutes dedicated to this patient today, preparing to see patient, examining the patient, ordering tests and/or medications and counseling the patient, documenting clinical information in the EHR or other health record, independently interpreting results and communicating results to the patient/family, discussing treatment and goals, answering patient's questions and coordinating care.  Cc:  Imelda Man, MD  Tex Filbert 05/08/2024 6:48 AM

## 2024-05-10 ENCOUNTER — Ambulatory Visit: Payer: Medicare Other | Admitting: Cardiovascular Disease

## 2024-05-11 ENCOUNTER — Ambulatory Visit: Payer: Medicare Other | Admitting: Physician Assistant

## 2024-05-11 ENCOUNTER — Ambulatory Visit: Admitting: Orthopedic Surgery

## 2024-05-11 ENCOUNTER — Encounter: Payer: Self-pay | Admitting: Orthopedic Surgery

## 2024-05-11 DIAGNOSIS — M79661 Pain in right lower leg: Secondary | ICD-10-CM | POA: Diagnosis not present

## 2024-05-11 DIAGNOSIS — S8012XA Contusion of left lower leg, initial encounter: Secondary | ICD-10-CM

## 2024-05-11 NOTE — Progress Notes (Signed)
 Office Visit Note   Patient: Rick Melendez           Date of Birth: January 02, 1936           MRN: [id number] Visit Date: 05/11/2024              Requested by: Imelda Man, MD [address] [place],  [place] [address] PCP: Imelda Man, MD  Chief Complaint  Patient presents with   Right Leg - Follow-up      HPI: Patient is an 88 year old gentleman who is seen in follow-up for the right lower extremity.  Patient is status post traumatic hematoma  Assessment & Plan: Visit Diagnoses:  1. Pain in right lower leg   2. Traumatic hematoma of left lower leg, initial encounter     Plan: Patient will advance to a medical compression stocking.  Follow-Up Instructions: Return in about 4 weeks (around 06/08/2024).   Ortho Exam  Patient is alert, oriented, no adenopathy, well-dressed, normal affect, normal respiratory effort. Examination the Profore dressing is removed there is decreased swelling there is wrinkling of the skin the ulcer over the medial calf measures 0.5 x 1 cm with healthy granulation tissue.  Calf is 36 cm in circumference.  Imaging: No results found. No images are attached to the encounter.  Labs: Lab Results  Component Value Date   HGBA1C 6.9 (H) 11/01/2023   ESRSEDRATE 6 02/10/2024   CRP <3.0 02/10/2024   LABURIC 5.2 02/10/2024   REPTSTATUS 03/24/2017 FINAL 03/19/2017   CULT NO GROWTH 5 DAYS 03/19/2017     Lab Results  Component Value Date   ALBUMIN 3.3 (L) 11/01/2023    Lab Results  Component Value Date   MG 1.5 (L) 03/19/2017   No results found for: "VD25OH"  No results found for: "PREALBUMIN"    Latest Ref Rng & Units 02/10/2024    1:48 PM 12/02/2023   12:51 PM 11/03/2023    4:27 AM  CBC EXTENDED  WBC 3.8 - 10.8 Thousand/uL 10.8  10.2  11.5   RBC 4.20 - 5.80 Million/uL 5.55  5.73  5.87   Hemoglobin 13.2 - 17.1 g/dL 19.1  47.8  29.5   HCT 38.5 - 50.0 % 46.2  47.9  48.1   Platelets 140 - 400 Thousand/uL 230  315  231    NEUT# 1,500 - 7,800 cells/uL 6,491        There is no height or weight on file to calculate BMI.  Orders:  No orders of the defined types were placed in this encounter.  No orders of the defined types were placed in this encounter.    Procedures: No procedures performed  Clinical Data: No additional findings.  ROS:  All other systems negative, except as noted in the HPI. Review of Systems  Objective: Vital Signs: There were no vitals taken for this visit.  Specialty Comments:  EXAM: MRI LUMBAR SPINE WITHOUT CONTRAST   TECHNIQUE: Multiplanar, multisequence MR imaging of the lumbar spine was performed. No intravenous contrast was administered.   COMPARISON:  Lumbar MRI 05/27/2014   FINDINGS: Segmentation:  Normal.  Lowest disc space L5-S1   Alignment: Mild anterolisthesis T12-L1. 4 mm retrolisthesis L1-2. Slight retrolisthesis L3-4. 6 mm anterolisthesis L4-5. Mild retrolisthesis L5-S1.   Vertebrae:  Normal bone marrow.  Negative for fracture or mass.   Conus medullaris and cauda equina: Conus extends to the L1-2 level. Conus and cauda equina appear normal.   Paraspinal and other soft tissues: Negative for  mass or adenopathy. No soft tissue edema or fluid collection.   Disc levels:   T12-L1: Advanced disc degeneration left greater than right. Prominent left-sided spurring. Bilateral facet degeneration. Moderate spinal stenosis. Severe subarticular and foraminal stenosis on the left. Mild subarticular stenosis on the right. Mild progression of stenosis on the left since the prior study   L1-2: Advanced disc degeneration with diffuse endplate spurring and marked disc space narrowing. Mild facet degeneration. Mild to moderate spinal stenosis. Moderate subarticular stenosis bilaterally. No interval change   L2-3: Disc degeneration with diffuse disc bulging and endplate spurring. Mild facet degeneration. Mild subarticular stenosis bilaterally with mild  progression   L3-4: Disc degeneration with diffuse disc bulging and spurring, right greater than left. Mild facet degeneration. Mild spinal stenosis. Moderate to severe right subarticular stenosis. Moderate left subarticular stenosis. No significant change   L4-5: 6 mm anterolisthesis with severe facet degeneration and diffuse bulging of the disc. Severe spinal stenosis and severe subarticular stenosis bilaterally have progressed since the prior study. In addition, there is severe right foraminal encroachment with right L4 nerve root impingement and moderate left foraminal encroachment.   L5-S1: Left paracentral disc protrusion with compression of the left S1 nerve root similar to the prior study. Mild facet degeneration bilaterally.   IMPRESSION: Advanced multilevel degenerative change throughout the lumbar spine. There has been progression of degenerative changes stenosis at multiple levels as described above   6 mm anterolisthesis L4-5. Severe spinal and severe subarticular stenosis bilaterally have progressed in the interval. Severe right foraminal encroachment.     Electronically Signed   By: Anastasio Balsam M.D.   On: 08/22/2020 21:23  PMFS History: Patient Active Problem List   Diagnosis Date Noted   Persistent atrial fibrillation (HCC) 12/02/2023   Hypercoagulable state due to persistent atrial fibrillation (HCC) 12/02/2023   Acute stroke due to ischemia (HCC) 11/02/2023   DOE (dyspnea on exertion) 08/11/2023   Upper airway cough syndrome 08/11/2023   Slow heart rate 04/14/2022   Influenza B    AKI (acute kidney injury) (HCC)    CAP (community acquired pneumonia) 03/19/2017   Bilateral primary osteoarthritis of knee 01/28/2017   Impingement syndrome of right shoulder 01/28/2017   CAD - CABG X 2 9/07 LIMA-LAD, SVG-OM. Low risk Myoview 7/12 09/07/2013   Essential hypertension 09/07/2013   Dyslipidemia 09/07/2013   Diabetes mellitus (HCC) 09/07/2013   Chronic  renal insufficiency, stage III (moderate)- SCr 1.6 (Aug 2013) 09/07/2013   Sleep apnea- compliant with C-pap 09/07/2013   PVD (peripheral vascular disease)- Rt iliac disease by doppler 7/13 09/07/2013   Past Medical History:  Diagnosis Date   Arthritis    Coronary artery disease 2007   cabg x3   Diabetes mellitus without complication (HCC)    Hyperlipidemia    Hypertension    Influenza B 03/19/2017   OSA on CPAP    Dr Loetta Ringer - follows and treatmentof sleep apnea   PVD (peripheral vascular disease) (HCC) 2003   DR BERRY-  -LEFT LEG STENTING    Family History  Problem Relation Age of Onset   Cancer Father    Cancer Brother     Past Surgical History:  Procedure Laterality Date   CARDIAC CATHETERIZATION  0911/2007   3 vessel CAD WITH LEFT MAIN CAD SEVERE ,norm lLIMA,LV nom. ,evidence for very mild aortic stenosis with gradient, mild stenosis distal abdnminal aotra and proximal left common iliac artery 30%   CARDIOVERSION N/A 12/06/2023   Procedure: CARDIOVERSION;  Surgeon: Audery Blazing,  Deannie Fabian, MD;  Location: [facility];  Service: Cardiovascular;  Laterality: N/A;   CORONARY ARTERY BYPASS GRAFT  08/31/2006   DR Felice Hora to LAD, VEIN TO AN OBTUSE MARGINAL BRANCH AND  RIGHT CORONARY ARTERY   CPET  04/13/2012   normal   DOPPLER ECHOCARDIOGRAPHY  07/08/2011   EF =>55%   lower arterial doppler  07/01/2012   mildly abnormal lower extremity;ABIs >1 bilaterally with moderately high-grade right iliac stenosis that had not changed from proir study.   NM MYOCAR PERF WALL MOTION  7/182012   EF 59%, normal myocardial perfusio   Social History   Occupational History   Not on file  Tobacco Use   Smoking status: Former    Current packs/day: 0.00    Types: Cigarettes    Quit date: 09/03/1981    Years since quitting: 42.7   Smokeless tobacco: Current    Types: Chew  Substance and Sexual Activity   Alcohol use: Yes   Drug use: No   Sexual activity: Not on file

## 2024-06-08 ENCOUNTER — Encounter: Payer: Self-pay | Admitting: Physician Assistant

## 2024-06-08 ENCOUNTER — Ambulatory Visit: Admitting: Physician Assistant

## 2024-06-08 DIAGNOSIS — S8012XA Contusion of left lower leg, initial encounter: Secondary | ICD-10-CM | POA: Diagnosis not present

## 2024-06-08 NOTE — Progress Notes (Signed)
 Office Visit Note   Patient: Rick Melendez           Date of Birth: Apr 24, 1936           MRN: [id number] Visit Date: 06/08/2024              Requested by: Imelda Man, MD [address] [place],  [place] [address] PCP: Imelda Man, MD  Chief Complaint  Patient presents with   Right Leg - Follow-up    Hematoma RLE       HPI: Patient is an 88 year old gentleman who is seen in follow-up for the right lower extremity. Patient is status post traumatic hematoma.  He feels like the knot is getting better.  He is wearing compression and elevating periodically.  He is able to perform daily activities around the house.  He states his left knee is feeling better after the injection.    Assessment & Plan: Visit Diagnoses:  1. Traumatic hematoma of left lower leg, initial encounter     Plan: Continue compression with activity as tolerates with elevation when at rest  Follow-Up Instructions: Return if symptoms worsen or fail to improve. If he has return of knee pain he will call our office.  Ortho Exam  Patient is alert, oriented, no adenopathy, well-dressed, normal affect, normal respiratory effort. Mild pitting edema, no open wounds.  Palpable left lateral knot slight tenderness to palpation.       Imaging: No results found. No images are attached to the encounter.  Labs: Lab Results  Component Value Date   HGBA1C 6.9 (H) 11/01/2023   ESRSEDRATE 6 02/10/2024   CRP <3.0 02/10/2024   LABURIC 5.2 02/10/2024   REPTSTATUS 03/24/2017 FINAL 03/19/2017   CULT NO GROWTH 5 DAYS 03/19/2017     Lab Results  Component Value Date   ALBUMIN 3.3 (L) 11/01/2023    Lab Results  Component Value Date   MG 1.5 (L) 03/19/2017   No results found for: VD25OH  No results found for: PREALBUMIN    Latest Ref Rng & Units 02/10/2024    1:48 PM 12/02/2023   12:51 PM 11/03/2023    4:27 AM  CBC EXTENDED  WBC 3.8 - 10.8 Thousand/uL 10.8  10.2  11.5   RBC 4.20 -  5.80 Million/uL 5.55  5.73  5.87   Hemoglobin 13.2 - 17.1 g/dL 19.1  47.8  29.5   HCT 38.5 - 50.0 % 46.2  47.9  48.1   Platelets 140 - 400 Thousand/uL 230  315  231   NEUT# 1,500 - 7,800 cells/uL 6,491        There is no height or weight on file to calculate BMI.  Orders:  No orders of the defined types were placed in this encounter.  No orders of the defined types were placed in this encounter.    Procedures: No procedures performed  Clinical Data: No additional findings.  ROS:  All other systems negative, except as noted in the HPI. Review of Systems  Objective: Vital Signs: There were no vitals taken for this visit.  Specialty Comments:  EXAM: MRI LUMBAR SPINE WITHOUT CONTRAST   TECHNIQUE: Multiplanar, multisequence MR imaging of the lumbar spine was performed. No intravenous contrast was administered.   COMPARISON:  Lumbar MRI 05/27/2014   FINDINGS: Segmentation:  Normal.  Lowest disc space L5-S1   Alignment: Mild anterolisthesis T12-L1. 4 mm retrolisthesis L1-2. Slight retrolisthesis L3-4. 6 mm anterolisthesis L4-5. Mild retrolisthesis L5-S1.   Vertebrae:  Normal  bone marrow.  Negative for fracture or mass.   Conus medullaris and cauda equina: Conus extends to the L1-2 level. Conus and cauda equina appear normal.   Paraspinal and other soft tissues: Negative for mass or adenopathy. No soft tissue edema or fluid collection.   Disc levels:   T12-L1: Advanced disc degeneration left greater than right. Prominent left-sided spurring. Bilateral facet degeneration. Moderate spinal stenosis. Severe subarticular and foraminal stenosis on the left. Mild subarticular stenosis on the right. Mild progression of stenosis on the left since the prior study   L1-2: Advanced disc degeneration with diffuse endplate spurring and marked disc space narrowing. Mild facet degeneration. Mild to moderate spinal stenosis. Moderate subarticular stenosis bilaterally. No  interval change   L2-3: Disc degeneration with diffuse disc bulging and endplate spurring. Mild facet degeneration. Mild subarticular stenosis bilaterally with mild progression   L3-4: Disc degeneration with diffuse disc bulging and spurring, right greater than left. Mild facet degeneration. Mild spinal stenosis. Moderate to severe right subarticular stenosis. Moderate left subarticular stenosis. No significant change   L4-5: 6 mm anterolisthesis with severe facet degeneration and diffuse bulging of the disc. Severe spinal stenosis and severe subarticular stenosis bilaterally have progressed since the prior study. In addition, there is severe right foraminal encroachment with right L4 nerve root impingement and moderate left foraminal encroachment.   L5-S1: Left paracentral disc protrusion with compression of the left S1 nerve root similar to the prior study. Mild facet degeneration bilaterally.   IMPRESSION: Advanced multilevel degenerative change throughout the lumbar spine. There has been progression of degenerative changes stenosis at multiple levels as described above   6 mm anterolisthesis L4-5. Severe spinal and severe subarticular stenosis bilaterally have progressed in the interval. Severe right foraminal encroachment.     Electronically Signed   By: Anastasio Balsam M.D.   On: 08/22/2020 21:23  PMFS History: Patient Active Problem List   Diagnosis Date Noted   Persistent atrial fibrillation (HCC) 12/02/2023   Hypercoagulable state due to persistent atrial fibrillation (HCC) 12/02/2023   Acute stroke due to ischemia (HCC) 11/02/2023   DOE (dyspnea on exertion) 08/11/2023   Upper airway cough syndrome 08/11/2023   Slow heart rate 04/14/2022   Influenza B    AKI (acute kidney injury) (HCC)    CAP (community acquired pneumonia) 03/19/2017   Bilateral primary osteoarthritis of knee 01/28/2017   Impingement syndrome of right shoulder 01/28/2017   CAD - CABG X 2  9/07 LIMA-LAD, SVG-OM. Low risk Myoview 7/12 09/07/2013   Essential hypertension 09/07/2013   Dyslipidemia 09/07/2013   Diabetes mellitus (HCC) 09/07/2013   Chronic renal insufficiency, stage III (moderate)- SCr 1.6 (Aug 2013) 09/07/2013   Sleep apnea- compliant with C-pap 09/07/2013   PVD (peripheral vascular disease)- Rt iliac disease by doppler 7/13 09/07/2013   Past Medical History:  Diagnosis Date   Arthritis    Coronary artery disease 2007   cabg x3   Diabetes mellitus without complication (HCC)    Hyperlipidemia    Hypertension    Influenza B 03/19/2017   OSA on CPAP    Dr Loetta Ringer - follows and treatmentof sleep apnea   PVD (peripheral vascular disease) (HCC) 2003   DR BERRY-  -LEFT LEG STENTING    Family History  Problem Relation Age of Onset   Cancer Father    Cancer Brother     Past Surgical History:  Procedure Laterality Date   CARDIAC CATHETERIZATION  0911/2007   3 vessel CAD WITH LEFT MAIN CAD  SEVERE ,norm lLIMA,LV nom. ,evidence for very mild aortic stenosis with gradient, mild stenosis distal abdnminal aotra and proximal left common iliac artery 30%   CARDIOVERSION N/A 12/06/2023   Procedure: CARDIOVERSION;  Surgeon: Lenise Quince, MD;  Location: [facility];  Service: Cardiovascular;  Laterality: N/A;   CORONARY ARTERY BYPASS GRAFT  08/31/2006   DR Felice Hora to LAD, VEIN TO AN OBTUSE MARGINAL BRANCH AND  RIGHT CORONARY ARTERY   CPET  04/13/2012   normal   DOPPLER ECHOCARDIOGRAPHY  07/08/2011   EF =>55%   lower arterial doppler  07/01/2012   mildly abnormal lower extremity;ABIs >1 bilaterally with moderately high-grade right iliac stenosis that had not changed from proir study.   NM MYOCAR PERF WALL MOTION  7/182012   EF 59%, normal myocardial perfusio   Social History   Occupational History   Not on file  Tobacco Use   Smoking status: Former    Current packs/day: 0.00    Types: Cigarettes    Quit date: 09/03/1981    Years since quitting:  42.7   Smokeless tobacco: Current    Types: Chew  Substance and Sexual Activity   Alcohol use: Yes   Drug use: No   Sexual activity: Not on file

## 2024-06-09 ENCOUNTER — Ambulatory Visit: Admitting: Podiatry

## 2024-06-13 DIAGNOSIS — I10 Essential (primary) hypertension: Secondary | ICD-10-CM | POA: Diagnosis not present

## 2024-06-13 DIAGNOSIS — E7800 Pure hypercholesterolemia, unspecified: Secondary | ICD-10-CM | POA: Diagnosis not present

## 2024-06-13 DIAGNOSIS — N1831 Chronic kidney disease, stage 3a: Secondary | ICD-10-CM | POA: Diagnosis not present

## 2024-06-13 DIAGNOSIS — E1169 Type 2 diabetes mellitus with other specified complication: Secondary | ICD-10-CM | POA: Diagnosis not present

## 2024-06-13 DIAGNOSIS — I4891 Unspecified atrial fibrillation: Secondary | ICD-10-CM | POA: Diagnosis not present

## 2024-06-15 ENCOUNTER — Ambulatory Visit: Admitting: Podiatry

## 2024-06-15 DIAGNOSIS — M79674 Pain in right toe(s): Secondary | ICD-10-CM | POA: Diagnosis not present

## 2024-06-15 DIAGNOSIS — B351 Tinea unguium: Secondary | ICD-10-CM | POA: Diagnosis not present

## 2024-06-15 DIAGNOSIS — M79675 Pain in left toe(s): Secondary | ICD-10-CM

## 2024-06-15 DIAGNOSIS — L84 Corns and callosities: Secondary | ICD-10-CM

## 2024-06-15 DIAGNOSIS — Z7901 Long term (current) use of anticoagulants: Secondary | ICD-10-CM

## 2024-06-15 MED ORDER — CLOTRIMAZOLE-BETAMETHASONE 1-0.05 % EX CREA: 1.0000 | TOPICAL_CREAM | Freq: Every day | CUTANEOUS | 0 refills | Status: AC

## 2024-06-15 NOTE — Progress Notes (Signed)
 Subjective: Chief Complaint  Patient presents with   [facility]    RM#12 DFC nail trim.    88 year old male presents the office today for concerns of thick, elongated nails not able to trim himself.  No open lesions.  No injuries  He is on Eliquis    Objective: AAO x3, NAD DP/PT pulses palpable bilaterally, CRT less than 3 seconds Nails are hypertrophic, dystrophic, brittle, discolored, elongated 10. No surrounding redness or drainage. Tenderness nails 1-5 bilaterally. No open lesions or pre-ulcerative lesions are identified today. Hyperkeratotic lesion b/l sub 5 without any underlying ulceration, drainage or signs of infection. No pain with calf compression, swelling, warmth, erythema  Assessment: Symptomatic onychomycosis, hyperkeratotic lesions x 2, on Eliquis    Plan: -All treatment options discussed with the patient including all alternatives, risks, complications.  -Toenails sharply debrided x 10 without any complications or bleeding. -Sharply debrided hyperkeratotic lesion  x 2 without any complications.  Moisturizer. -Patient encouraged to call the office with any questions, concerns, change in symptoms.   Return in about 3 months (around 09/15/2024) for nail trim, skin rash.  Donnice JONELLE Fees DPM

## 2024-06-21 ENCOUNTER — Encounter: Payer: Self-pay | Admitting: Cardiovascular Disease

## 2024-06-21 ENCOUNTER — Ambulatory Visit: Attending: Cardiology | Admitting: Cardiovascular Disease

## 2024-06-21 VITALS — BP 130/58 | HR 43 | Resp 16 | Ht 66.0 in | Wt 200.6 lb

## 2024-06-21 DIAGNOSIS — G4733 Obstructive sleep apnea (adult) (pediatric): Secondary | ICD-10-CM

## 2024-06-21 DIAGNOSIS — I739 Peripheral vascular disease, unspecified: Secondary | ICD-10-CM

## 2024-06-21 DIAGNOSIS — I2510 Atherosclerotic heart disease of native coronary artery without angina pectoris: Secondary | ICD-10-CM | POA: Diagnosis not present

## 2024-06-21 DIAGNOSIS — I10 Essential (primary) hypertension: Secondary | ICD-10-CM | POA: Diagnosis not present

## 2024-06-21 DIAGNOSIS — E785 Hyperlipidemia, unspecified: Secondary | ICD-10-CM

## 2024-06-21 DIAGNOSIS — I639 Cerebral infarction, unspecified: Secondary | ICD-10-CM | POA: Diagnosis not present

## 2024-06-21 NOTE — Assessment & Plan Note (Signed)
 History of CAD status post cardiac catheterization performed by myself 08/31/2006 revealing left main/two-vessel disease.  He underwent CABG x 2 by Dr. Army with a LIMA to his LAD and a vein to an obtuse marginal branch.  His last Myoview performed 07/06/2013 was nonischemic.  He denies chest pain or shortness of breath.

## 2024-06-21 NOTE — Assessment & Plan Note (Signed)
 History of essential hypertension blood pressure measured today 130/58.  He is on metoprolol .

## 2024-06-21 NOTE — Assessment & Plan Note (Signed)
 History of peripheral arterial disease status post remote left iliac stenting by myself back in 2003.  Dopplers have shown a right iliac stenosis although he is asymptomatic.  His last Doppler studies performed 07/05/2023 revealed patent left iliac stent with moderate right common iliac artery stenosis.  Given his age and lack of symptoms, I do not feel compelled to continue to follow this noninvasively.

## 2024-06-21 NOTE — Assessment & Plan Note (Signed)
History of obstructive sleep apnea currently not wearing his CPAP.

## 2024-06-21 NOTE — Progress Notes (Signed)
 06/21/2024 Rick Melendez   May 08, 1936  [id number]  Primary Physician Clarice Nottingham, MD Primary Cardiologist: Dorn JINNY Lesches MD FACP, [place], [place], [place]  HPI:  Rick Melendez is a 88 y.o.   severely obese, married Caucasian male, father of 2, grandfather to 2 grandchildren who I last saw in the office 05/05/2023.  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 He doesn't do any regular exercise but he active around the farm with chores. He has a remote history tobacco abuse, having quit 20 years ago, as well as, treated hypertension and hyperlipidemia, and diabetes.    He has CAD and had left main 2-vessel disease by cath August 31, 2006, and underwent coronary artery bypass grafting x2 by Dr. Katherene Jude with a LIMA to his LAD, a vein to an obtuse marginal branch and right coronary arter. He has also had stenting of his left leg back in 2003, doppler in July 2013 showed Rt iliac disease but he has been asymptomatic. He denies chest pain or shortness of breath. He does have daytime fatigue and this is unchanged. He has had some leg pain which he attributes to statin Rx. His last Myoview was performed 07/06/13 was nonischemic.[name] He sees Dr. Burnard f for obstructive sleep apnea.  He is on CPAP supposedly but unfortunately is not wearing this.     Since I saw him in the office a year ago he continues to do well.  He denies chest pain , shortness of breath or claudication.  He did unfortunately have a stroke 11/02/2023.  A 2D echo with bubble study did not show an intra-atrial shunt, carotid Dopplers were normal.  He did have atrial fibrillation and underwent successful cardioversion by Dr. Pietro 12/06/2023 maintaining sinus rhythm on Eliquis .  He does have some balance issues since the stroke and walks with a cane.     Current Meds  Medication Sig   albuterol  (PROVENTIL  HFA) 108 (90  Base) MCG/ACT inhaler Inhale 2 puffs into the lungs every 4 (four) hours as needed for wheezing or shortness of breath.   apixaban  (ELIQUIS ) 5 MG TABS tablet Take 1 tablet (5 mg total) by mouth 2 (two) times daily.   clotrimazole -betamethasone  (LOTRISONE ) cream Apply 1 Application topically daily.   colchicine  0.6 MG tablet Take 1 tablet (0.6 mg total) by mouth daily.   Cyanocobalamin (B-12 PO) Take 1,000 mcg by mouth daily.   cyclobenzaprine (FLEXERIL) 5 MG tablet Take 5 mg by mouth daily as needed for muscle spasms.   ezetimibe  (ZETIA ) 10 MG tablet Take 10 mg by mouth daily.   famotidine (PEPCID) 20 MG tablet Take 20 mg by mouth 2 (two) times daily.   FARXIGA 10 MG TABS tablet Take 10 mg by mouth daily.   furosemide (LASIX) 20 MG tablet Take 20 mg by mouth daily.   ipratropium (ATROVENT) 0.06 % nasal spray Place 1 spray into the nose 3 (three) times daily as needed for rhinitis.   KERENDIA 10 MG TABS Take 10 mg by mouth daily.   meclizine (ANTIVERT) 25 MG tablet Take 25 mg by mouth 3 (three) times daily as needed for dizziness.   memantine  (NAMENDA ) 5 MG tablet Take 1 tablet  twice a day   metoprolol  tartrate (LOPRESSOR ) 25 MG tablet Take 0.5 tablets (12.5 mg total) by mouth 2 (two) times daily.   OVER  THE COUNTER MEDICATION USES CPAP NIGHTLY   rosuvastatin  (CRESTOR ) 40 MG tablet Take 40 mg by mouth daily.   traMADol (ULTRAM) 50 MG tablet Take 50-100 mg by mouth See admin instructions. Take 50mg  (1 tablet) by mouth two to three times a day.     Allergies  Allergen Reactions   Ozempic (0.25 Or 0.5 Mg-Dose) [Semaglutide(0.25 Or 0.5mg -Dos)] Other (See Comments)    Hallucinations, lethargic     Social History   Socioeconomic History   Marital status: Married    Spouse name: Not on file   Number of children: Not on file   Years of education: Not on file   Highest education level: Not on file  Occupational History   Not on file  Tobacco Use   Smoking status: Former    Current  packs/day: 0.00    Types: Cigarettes    Quit date: 09/03/1981    Years since quitting: 42.8   Smokeless tobacco: Current    Types: Chew  Substance and Sexual Activity   Alcohol use: Yes   Drug use: No   Sexual activity: Not on file  Other Topics Concern   Not on file  Social History Narrative   Right handed   Drinks coffe   One floor home   Lives with wife   Retired   Chief Executive Officer [employer] investment banker Strain: Not on file  Food Insecurity: No Food Insecurity (11/02/2023)   Hunger Vital Sign    Worried About Running Out of Food in the Last Year: Never true    Ran Out of Food in the Last Year: Never true  Transportation Needs: No Transportation Needs (11/02/2023)   PRAPARE - Administrator, Civil Service (Medical): No    Lack of Transportation (Non-Medical): No  Physical Activity: Not on file  Stress: Not on file  Social Connections: Unknown (09/16/2022)   Received from [facility]   Social Network    Social Network: Not on file  Intimate Partner Violence: Not At Risk (11/02/2023)   Humiliation, Afraid, Rape, and Kick questionnaire    Fear of Current or Ex-Partner: No    Emotionally Abused: No    Physically Abused: No    Sexually Abused: No     Review of Systems: General: negative for chills, fever, night sweats or weight changes.  Cardiovascular: negative for chest pain, dyspnea on exertion, edema, orthopnea, palpitations, paroxysmal nocturnal dyspnea or shortness of breath Dermatological: negative for rash Respiratory: negative for cough or wheezing Urologic: negative for hematuria Abdominal: negative for nausea, vomiting, diarrhea, bright red blood per rectum, melena, or hematemesis Neurologic: negative for visual changes, syncope, or dizziness All other systems reviewed and are otherwise negative except as noted above.    Blood pressure (!) 130/58, pulse (!) 43, resp. rate 16, height 5' 6 (1.676 m), weight 200 lb 9.6 oz (91 kg), SpO2  96%.  General appearance: alert and no distress Neck: no adenopathy, no carotid bruit, no JVD, supple, symmetrical, trachea midline, and thyroid  not enlarged, symmetric, no tenderness/mass/nodules Lungs: clear to auscultation bilaterally Heart: regular rate and rhythm, S1, S2 normal, no murmur, click, rub or gallop Extremities: extremities normal, atraumatic, no cyanosis or edema Pulses: 2+ and symmetric Skin: Skin color, texture, turgor normal. No rashes or lesions Neurologic: Grossly normal  EKG not performed today      ASSESSMENT AND PLAN:   CAD - CABG X 2 9/07 LIMA-LAD, SVG-OM. Low risk Myoview 7/12 History of CAD status post cardiac  catheterization performed by myself 08/31/2006 revealing left main/two-vessel disease.  He underwent CABG x 2 by Dr. Army with a LIMA to his LAD and a vein to an obtuse marginal branch.  His last Myoview performed 07/06/2013 was nonischemic.  He denies chest pain or shortness of breath.  Dyslipidemia History of dyslipidemia on high-dose rosuvastatin  and Zetia  with lipid profile performed 11/03/2023 revealing total cholesterol 89, LDL 26 and HDL 26.  Sleep apnea- compliant with C-pap History of obstructive sleep apnea currently not wearing his CPAP.  PVD (peripheral vascular disease)- Rt iliac disease by doppler 7/13 History of peripheral arterial disease status post remote left iliac stenting by myself back in 2003.  Dopplers have shown a right iliac stenosis although he is asymptomatic.  His last Doppler studies performed 07/05/2023 revealed patent left iliac stent with moderate right common iliac artery stenosis.  Given his age and lack of symptoms, I do not feel compelled to continue to follow this noninvasively.  Essential hypertension History of essential hypertension blood pressure measured today 130/58.  He is on metoprolol .  Acute stroke due to ischemia [facility]) Patient had stroke 11/02/2023.  He had a 2D echo with bubble study that revealed  normal LV systolic function, and no evidence of interatrial shunt.  Carotid Dopplers are normal.  He underwent successful cardioversion by Dr. Pietro 12/06/2023 to sinus rhythm which he has maintained on Eliquis  oral anticoagulation.     Dorn DOROTHA Lesches MD FACP,FACC,FAHA, [facility] 06/21/2024 10:47 AM

## 2024-06-21 NOTE — Patient Instructions (Signed)
 Medication Instructions:  The current medical regimen is effective;  continue present plan and medications.  *If you need a refill on your cardiac medications before your next appointment, please call your pharmacy*  Follow-Up: At [facility], you and your health needs are our priority.  As part of our continuing mission to provide you with exceptional heart care, our providers are all part of one team.  This team includes your primary Cardiologist (physician) and Advanced Practice Providers or APPs (Physician Assistants and Nurse Practitioners) who all work together to provide you with the care you need, when you need it.  Your next appointment:   6 month(s)  Provider:   One of our Advanced Practice Providers (APPs): Morse Clause, PA-C  Lamarr Satterfield, NP Miriam Shams, NP  Olivia Pavy, PA-C Josefa Beauvais, NP  Leontine Salen, PA-C Orren Fabry, PA-C  [place], PA-C Ernest Dick, NP  Damien Braver, NP Jon Hails, PA-C  Waddell Donath, PA-C    Dayna Dunn, PA-C  Scott Weaver, PA-C Lum Louis, NP Katlyn West, NP Callie Goodrich, PA-C  Evan Williams, PA-C Sheng Haley, PA-C  Xika Zhao, NP Kathleen Johnson, PA-C   Then, Dorn Lesches, MD will plan to see you again in 1 year(s).    We recommend signing up for the patient portal called MyChart.  Sign up information is provided on this After Visit Summary.  MyChart is used to connect with patients for Virtual Visits (Telemedicine).  Patients are able to view lab/test results, encounter notes, upcoming appointments, etc.  Non-urgent messages can be sent to your provider as well.   To learn more about what you can do with MyChart, go to [email or web address].

## 2024-06-21 NOTE — Assessment & Plan Note (Signed)
 Patient had stroke 11/02/2023.  He had a 2D echo with bubble study that revealed normal LV systolic function, and no evidence of interatrial shunt.  Carotid Dopplers are normal.  He underwent successful cardioversion by Dr. Pietro 12/06/2023 to sinus rhythm which he has maintained on Eliquis  oral anticoagulation.

## 2024-06-21 NOTE — Assessment & Plan Note (Signed)
 History of dyslipidemia on high-dose rosuvastatin  and Zetia  with lipid profile performed 11/03/2023 revealing total cholesterol 89, LDL 26 and HDL 26.

## 2024-07-05 ENCOUNTER — Ambulatory Visit: Admitting: Physician Assistant

## 2024-07-05 ENCOUNTER — Encounter: Payer: Self-pay | Admitting: Physician Assistant

## 2024-07-05 VITALS — BP 132/53 | HR 63 | Resp 18 | Ht 66.0 in | Wt 200.0 lb

## 2024-07-05 DIAGNOSIS — G3184 Mild cognitive impairment, so stated: Secondary | ICD-10-CM | POA: Diagnosis not present

## 2024-07-05 MED ORDER — MEMANTINE HCL 5 MG PO TABS: ORAL_TABLET | ORAL | 3 refills | Status: DC

## 2024-07-05 NOTE — Patient Instructions (Signed)
 It was a pleasure to see you today at our office.   Recommendations:  Continue  Memantine  5 mg  twice a day   Follow up in 6 months  Use the hearing aids  Use the CPAP!!! Continue Eliquis twice a day as prescribed. Follow with Cardiology  Whom to call:  Memory  decline, memory medications: Call our office [phone]   For psychiatric meds, mood meds: Please have your primary care physician manage these medications.    For assessment of decision of mental capacity and competency:  Call Dr. Erick Blinks, geriatric psychiatrist at [phone]       If you have any severe symptoms of a stroke, or other severe issues such as confusion,severe chills or fever, etc call 911 or go to the ER as you may need to be evaluated further      RECOMMENDATIONS FOR ALL PATIENTS WITH MEMORY PROBLEMS: 1. Continue to exercise (Recommend 30 minutes of walking everyday, or 3 hours every week) 2. Increase social interactions - continue going to [place] and enjoy social gatherings with friends and family 3. Eat healthy, avoid fried foods and eat more fruits and vegetables 4. Maintain adequate blood pressure, blood sugar, and blood cholesterol level. Reducing the risk of stroke and cardiovascular disease also helps promoting better memory. 5. Avoid stressful situations. Live a simple life and avoid aggravations. Organize your time and prepare for the next day in anticipation. 6. Sleep well, avoid any interruptions of sleep and avoid any distractions in the bedroom that may interfere with adequate sleep quality 7. Avoid sugar, avoid sweets as there is a strong link between excessive sugar intake, diabetes, and cognitive impairment We discussed the Mediterranean diet, which has been shown to help patients reduce the risk of progressive memory disorders and reduces cardiovascular risk. This includes eating fish, eat fruits and green leafy vegetables, nuts like almonds and hazelnuts, walnuts, and also use  olive oil. Avoid fast foods and fried foods as much as possible. Avoid sweets and sugar as sugar use has been linked to worsening of memory function.  There is always a concern of gradual progression of memory problems. If this is the case, then we may need to adjust level of care according to patient needs. Support, both to the patient and caregiver, should then be put into place.     FALL PRECAUTIONS: Be cautious when walking. Scan the area for obstacles that may increase the risk of trips and falls. When getting up in the mornings, sit up at the edge of the bed for a few minutes before getting out of bed. Consider elevating the bed at the head end to avoid drop of blood pressure when getting up. Walk always in a well-lit room (use night lights in the walls). Avoid area rugs or power cords from appliances in the middle of the walkways. Use a walker or a cane if necessary and consider physical therapy for balance exercise. Get your eyesight checked regularly.  FINANCIAL OVERSIGHT: Supervision, especially oversight when making financial decisions or transactions is also recommended.  HOME SAFETY: Consider the safety of the kitchen when operating appliances like stoves, microwave oven, and blender. Consider having supervision and share cooking responsibilities until no longer able to participate in those. Accidents with firearms and other hazards in the house should be identified and addressed as well.   ABILITY TO BE LEFT ALONE: If patient is unable to contact 911 operator, consider using LifeLine, or when the need is there, arrange for  someone to stay with patients. Smoking is a fire hazard, consider supervision or cessation. Risk of wandering should be assessed by caregiver and if detected at any point, supervision and safe proof recommendations should be instituted.  MEDICATION SUPERVISION: Inability to self-administer medication needs to be constantly addressed. Implement a mechanism to ensure  safe administration of the medications.   DRIVING: Regarding driving, in patients with progressive memory problems, driving will be impaired. We advise to have someone else do the driving if trouble finding directions or if minor accidents are reported. Independent driving assessment is available to determine safety of driving.   If you are interested in the driving assessment, you can contact the following:  [employer] in [place] [phone]  [facility] [phone]  [facility] [phone] or [phone]    Mediterranean Diet A Mediterranean diet refers to food and lifestyle choices that are based on the traditions of countries located on the [employer]. This way of eating has been shown to help prevent certain conditions and improve outcomes for people who have chronic diseases, like kidney disease and heart disease. What are tips for following this plan? Lifestyle  Cook and eat meals together with your family, when possible. Drink enough fluid to keep your urine clear or pale yellow. Be physically active every day. This includes: Aerobic exercise like running or swimming. Leisure activities like gardening, walking, or housework. Get 7-8 hours of sleep each night. If recommended by your health care provider, drink red wine in moderation. This means 1 glass a day for nonpregnant women and 2 glasses a day for men. A glass of wine equals 5 oz (150 mL). Reading food labels  Check the serving size of packaged foods. For foods such as rice and pasta, the serving size refers to the amount of cooked product, not dry. Check the total fat in packaged foods. Avoid foods that have saturated fat or trans fats. Check the ingredients list for added sugars, such as corn syrup. Shopping  At the grocery store, buy most of your food from the areas near the walls of the store. This includes: Fresh fruits and  vegetables (produce). Grains, beans, nuts, and seeds. Some of these may be available in unpackaged forms or large amounts (in bulk). Fresh seafood. Poultry and eggs. Low-fat dairy products. Buy whole ingredients instead of prepackaged foods. Buy fresh fruits and vegetables in-season from local farmers markets. Buy frozen fruits and vegetables in resealable bags. If you do not have access to quality fresh seafood, buy precooked frozen shrimp or canned fish, such as tuna, salmon, or sardines. Buy small amounts of raw or cooked vegetables, salads, or olives from the deli or salad bar at your store. Stock your pantry so you always have certain foods on hand, such as olive oil, canned tuna, canned tomatoes, rice, pasta, and beans. Cooking  Cook foods with extra-virgin olive oil instead of using butter or other vegetable oils. Have meat as a side dish, and have vegetables or grains as your main dish. This means having meat in small portions or adding small amounts of meat to foods like pasta or stew. Use beans or vegetables instead of meat in common dishes like chili or lasagna. Experiment with different cooking methods. Try roasting or broiling vegetables instead of steaming or sauteing them. Add frozen vegetables to soups, stews, pasta, or rice. Add nuts or seeds for added healthy fat at each meal. You can add these to yogurt, salads,  or vegetable dishes. Marinate fish or vegetables using olive oil, lemon juice, garlic, and fresh herbs. Meal planning  Plan to eat 1 vegetarian meal one day each week. Try to work up to 2 vegetarian meals, if possible. Eat seafood 2 or more times a week. Have healthy snacks readily available, such as: Vegetable sticks with hummus. Greek yogurt. Fruit and nut trail mix. Eat balanced meals throughout the week. This includes: Fruit: 2-3 servings a day Vegetables: 4-5 servings a day Low-fat dairy: 2 servings a day Fish, poultry, or lean meat: 1 serving a  day Beans and legumes: 2 or more servings a week Nuts and seeds: 1-2 servings a day Whole grains: 6-8 servings a day Extra-virgin olive oil: 3-4 servings a day Limit red meat and sweets to only a few servings a month What are my food choices? Mediterranean diet Recommended Grains: Whole-grain pasta. Brown rice. Bulgar wheat. Polenta. Couscous. Whole-wheat bread. Orpah Cobb. Vegetables: Artichokes. Beets. Broccoli. Cabbage. Carrots. Eggplant. Green beans. Chard. Kale. Spinach. Onions. Leeks. Peas. Squash. Tomatoes. Peppers. Radishes. Fruits: Apples. Apricots. Avocado. Berries. Bananas. Cherries. Dates. Figs. Grapes. Lemons. Melon. Oranges. Peaches. Plums. Pomegranate. Meats and other protein foods: Beans. Almonds. Sunflower seeds. Pine nuts. Peanuts. Cod. Salmon. Scallops. Shrimp. Tuna. Tilapia. Clams. Oysters. Eggs. Dairy: Low-fat milk. Cheese. Greek yogurt. Beverages: Water. Red wine. Herbal tea. Fats and oils: Extra virgin olive oil. Avocado oil. Grape seed oil. Sweets and desserts: [place] yogurt with honey. Baked apples. Poached pears. Trail mix. Seasoning and other foods: Basil. Cilantro. Coriander. Cumin. Mint. Parsley. Sage. Rosemary. Tarragon. Garlic. Oregano. Thyme. Pepper. Balsalmic vinegar. Tahini. Hummus. Tomato sauce. Olives. Mushrooms. Limit these Grains: Prepackaged pasta or rice dishes. Prepackaged cereal with added sugar. Vegetables: Deep fried potatoes (french fries). Fruits: Fruit canned in syrup. Meats and other protein foods: Beef. Pork. Lamb. Poultry with skin. Hot dogs. Tomasa Blase. Dairy: Ice cream. Sour cream. Whole milk. Beverages: Juice. Sugar-sweetened soft drinks. Beer. Liquor and spirits. Fats and oils: Butter. Canola oil. Vegetable oil. Beef fat (tallow). Lard. Sweets and desserts: Cookies. Cakes. Pies. Candy. Seasoning and other foods: Mayonnaise. Premade sauces and marinades. The items listed may not be a complete list. Talk with your dietitian about what  dietary choices are right for you. Summary The Mediterranean diet includes both food and lifestyle choices. Eat a variety of fresh fruits and vegetables, beans, nuts, seeds, and whole grains. Limit the amount of red meat and sweets that you eat. Talk with your health care provider about whether it is safe for you to drink red wine in moderation. This means 1 glass a day for nonpregnant women and 2 glasses a day for men. A glass of wine equals 5 oz (150 mL). This information is not intended to replace advice given to you by your health care provider. Make sure you discuss any questions you have with your health care provider. Document Released: 07/30/2016 Document Revised: 09/01/2016 Document Reviewed: 07/30/2016 Elsevier Interactive Patient Education  2017 [employer].   Your provider has requested that you have labwork completed today. Please go to [facility] ([id number]) on the second floor of this building before leaving the office today. You do not need to check in. If you are not called within 15 minutes please check with the front desk.

## 2024-07-05 NOTE — Progress Notes (Addendum)
 Assessment/Plan:    Mild cognitive impairment   Rick Melendez is a very pleasant 88 y.o. RH male with a history of hypertension, hyperlipidemia, DM2, osteoarthritis with multiple surgeries, mild anemia, hard of hearing, OSA on CPAP after weight loss, CAD, atrial fibrillation status post cardioversion, on Eliquis , hypoproteinemia, small right paramedian pons acute infarct November 2024, presenting today in follow-up for evaluation of memory loss. Patient is on memantine  5 mg twice a day.  Memory is stable, with MMSE today of 29/30.  He still able to participate on his ADLs and drive without difficulty.  Mood is anxious, he is discussed this issue with his PCP.      Recommendations:   Follow up in 6  months. Continue memantine  5 mg twice daily, side effects discussed Recommend using CPAP for OSA Recommending using hearing aids in an effort to improve comprehension Recommend good control of cardiovascular risk factors.  Continue secondary stroke prevention.  Follow-up with cardiology Continue to control mood as per PCP     Subjective:   This patient is accompanied in the office by his wife  who supplements the history. Previous records as well as any outside records available were reviewed prior to todays visit.  Patient was last seen on 11/12/2023 with an MMSE 29/30. [name]    Any changes in memory since last visit? About the same.  Main issue is with short-term memory, with recent conversations, names, short-term memory is excellent.  He does not like doing any brain stimulating exercises.  He enjoys going to [employer] oneself?  Endorsed Disoriented when walking into a room?  Patient denies    Misplacing objects?  Patient denies   Wandering behavior?   Denies. Any personality changes since last visit? Denies.   Any worsening depression?: denies.   Hallucinations or paranoia?  Denies.   Seizures?   Denies.    Any sleep changes?  Does not sleep very well then sleeps during the  day. Denies vivid dreams, REM behavior or sleepwalking   Sleep apnea?  Endorsed, does not like using his CPAP   Any hygiene concerns?   Denies.   Independent of bathing and dressing?  Endorsed  Does the patient needs help with medications?  Wife is in charge   Who is in charge of the finances?  Wife is in charge    Any changes in appetite?  Denies. It is great     Patient have trouble swallowing?  Denies.   Does the patient cook?  Any kitchen accidents such as leaving the stove on?   Denies.   Any headaches?    Denies.   Vision changes? Denies. Chronic pain?  He has chronic back pain due to advanced multilevel DJD, severe L-spine stenosis receiving back injections Ambulates with difficulty?  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  He uses a walker for stability.    Recent falls or head injuries?    Denies.     Any new strokelike symptoms?  Recently, he was dizzy and nauseous when getting up to go to the bathroom , no LOC or head injury. He was diagnosed with atrial fibrillation which caused the stroke in the past .  He is now on Eliquis  Any tremors?  Denies.   Any anosmia?    Denies.   Any incontinence of urine?  Endorsed, urge incontinence, nocturia , he attributes it to the water pill. Any bowel dysfunction?  Denies.  Patient lives with his wife in a farm. Does the patient drive?  Yes, he denies getting lost   Initial evaluation 10/02/2022  How long did patient have memory difficulties? I didn't notice anything.  Wife is not sure that he has significant memory changes, the doctor sent me here.  Disoriented when walking into a room?  Patient denies   Leaving objects in unusual places?  Patient denies   Patient lives wife Ambulates  with difficulty? Uses a walker and a cane for stability due to arthritis  Recent falls?  Fell at the end of July and uses a walker. ONnPT/OT weekly  Any head injuries?  Patient denies   History of seizures?    Patient denies   Wandering behavior?  Patient denies   Patient drives? No issues driving  Any mood changes ?  Denies  Any depression?:  Patient denies  I like me too good! Hallucinations?  Patient denies now, but when I was on Ozempic he did, once off I was fine. Paranoia?  Patient denies   Patient reports that sleeps well without vivid dreams, REM behavior or sleepwalking    History of sleep apnea? Endorsed, but after 40 lb weight loss I no longer need CPAP Any hygiene concerns?  Patient denies   Independent of bathing and dressing?  Endorsed  Does the patient needs help with medications? He is in charge  Who is in charge of the finances? Wife has always been is in charge   Any changes in appetite?  Patient denies   Patient have trouble swallowing? Patient denies   Does the patient cook?  Patient denies   Any kitchen accidents such as leaving the stove on? Patient denies   Any headaches?  Patient denies   Double vision? Patient denies   Any focal numbness or tingling?  Patient denies   Chronic back pain: Endorsed, s/p falls and several surgeries shoulder, C spine Unilateral weakness?  Patient denies   Any tremors?  Patient denies   Any history of anosmia?  Patient denies   Any incontinence of urine?  Patient denies   Any bowel dysfunction?   Patient denies   History of heavy alcohol intake?  Patient denies   History of heavy tobacco use?  Patient denies   Family history of dementia?   Denies     Retired plumber   MRI brain report 09/22/22 There is mild diffuse cerebral atrophy. No hippocampal atrophy.     Labs 10/02/2022 TSH 1.93 MRI brain personally reviewed 11/02/23 small acute R paramedian pons infarct, mild diffuse cerebral atrophy   Past Medical History:  Diagnosis Date   Arthritis    Coronary artery disease 2007   cabg x3   Diabetes mellitus without complication (HCC)    Hyperlipidemia    Hypertension    Influenza B 03/19/2017   OSA on CPAP    Dr BURNARD -  follows and treatmentof sleep apnea   PVD (peripheral vascular disease) (HCC) 2003   DR BERRY-  -LEFT LEG STENTING     Past Surgical History:  Procedure Laterality Date   CARDIAC CATHETERIZATION  0911/2007   3 vessel CAD WITH LEFT MAIN CAD SEVERE ,norm lLIMA,LV nom. ,evidence for very mild aortic stenosis with gradient, mild stenosis distal abdnminal aotra and proximal left common iliac artery 30%   CARDIOVERSION N/A 12/06/2023   Procedure: CARDIOVERSION;  Surgeon: Pietro Redell RAMAN, MD;  Location: [facility];  Service: Cardiovascular;  Laterality: N/A;   CORONARY ARTERY BYPASS  GRAFT  08/31/2006   DR GERHARDT---LIMA to LAD, VEIN TO AN OBTUSE MARGINAL BRANCH AND  RIGHT CORONARY ARTERY   CPET  04/13/2012   normal   DOPPLER ECHOCARDIOGRAPHY  07/08/2011   EF =>55%   lower arterial doppler  07/01/2012   mildly abnormal lower extremity;ABIs >1 bilaterally with moderately high-grade right iliac stenosis that had not changed from proir study.   NM MYOCAR PERF WALL MOTION  7/182012   EF 59%, normal myocardial perfusio     PREVIOUS MEDICATIONS:   CURRENT MEDICATIONS:  Outpatient Encounter Medications as of 07/05/2024  Medication Sig   albuterol  (PROVENTIL  HFA) 108 (90 Base) MCG/ACT inhaler Inhale 2 puffs into the lungs every 4 (four) hours as needed for wheezing or shortness of breath.   apixaban  (ELIQUIS ) 5 MG TABS tablet Take 1 tablet (5 mg total) by mouth 2 (two) times daily.   clotrimazole -betamethasone  (LOTRISONE ) cream Apply 1 Application topically daily.   colchicine  0.6 MG tablet Take 1 tablet (0.6 mg total) by mouth daily.   Cyanocobalamin (B-12 PO) Take 1,000 mcg by mouth daily.   cyclobenzaprine (FLEXERIL) 5 MG tablet Take 5 mg by mouth daily as needed for muscle spasms.   ezetimibe  (ZETIA ) 10 MG tablet Take 10 mg by mouth daily.   famotidine (PEPCID) 20 MG tablet Take 20 mg by mouth 2 (two) times daily.   FARXIGA 10 MG TABS tablet Take 10 mg by mouth daily.   furosemide  (LASIX) 20 MG tablet Take 20 mg by mouth daily.   ipratropium (ATROVENT) 0.06 % nasal spray Place 1 spray into the nose 3 (three) times daily as needed for rhinitis.   KERENDIA 10 MG TABS Take 10 mg by mouth daily.   meclizine (ANTIVERT) 25 MG tablet Take 25 mg by mouth 3 (three) times daily as needed for dizziness.   memantine  (NAMENDA ) 5 MG tablet Take 1 tablet  twice a day   metoprolol  tartrate (LOPRESSOR ) 25 MG tablet Take 0.5 tablets (12.5 mg total) by mouth 2 (two) times daily.   OVER THE COUNTER MEDICATION USES CPAP NIGHTLY   rosuvastatin  (CRESTOR ) 40 MG tablet Take 40 mg by mouth daily.   traMADol (ULTRAM) 50 MG tablet Take 50-100 mg by mouth See admin instructions. Take 50mg  (1 tablet) by mouth two to three times a day.   [DISCONTINUED] memantine  (NAMENDA ) 5 MG tablet Take 1 tablet  twice a day   No facility-administered encounter medications on file as of 07/05/2024.     Objective:     PHYSICAL EXAMINATION:    VITALS:   Vitals:   07/05/24 0936  BP: (!) 132/53  Pulse: 63  Resp: 18  SpO2: 95%  Weight: 200 lb (90.7 kg)  Height: 5' 6 (1.676 m)    GEN:  The patient appears stated age and is in NAD. HEENT:  Normocephalic, atraumatic.   Neurological examination:  General: NAD, well-groomed, appears stated age. Orientation: The patient is alert. Oriented to person, place and not to date.  Cranial nerves: There is good facial symmetry.The speech is fluent and clear. No aphasia or dysarthria. Fund of knowledge is appropriate. Recent memory impaired and remote memory is normal.  Attention and concentration are normal.  Able to name objects and repeat phrases.  Hearing is decreased to conversational tone .   Delayed recall 3/3 Sensation: Sensation is intact to light touch throughout Motor: Strength is at least antigravity x4. DTR's 2/4 in UE/LE      10/02/2022   10:00 AM  [facility]  Cognitive Assessment   Visuospatial/ Executive (0/5) 1  Naming (0/3) 3  Attention: Read  list of digits (0/2) 2  Attention: Read list of letters (0/1) 1  Attention: Serial 7 subtraction starting at 100 (0/3) 3  Language: Repeat phrase (0/2) 1  Language : Fluency (0/1) 0  Abstraction (0/2) 0  Delayed Recall (0/5) 4  Orientation (0/6) 5  Total 20  Adjusted Score (based on education) 20       07/05/2024    4:00 PM 11/12/2023   12:00 PM 07/16/2023   11:00 AM  MMSE - Mini Mental State Exam  Orientation to time 4 5 4   Orientation to Place 5 5 5   Registration 3 3 3   Attention/ Calculation 5 5 5   Recall 3 2 2   Language- name 2 objects 2 2 2   Language- repeat 1 1 1   Language- follow 3 step command 3 3 3   Language- read & follow direction 1 1 1   Write a sentence 1 1 1   Copy design 1 1 0  Total score 29 29 27        Movement examination: Tone: There is normal tone in the UE/LE Abnormal movements:  no tremor.  No myoclonus.  No asterixis.   Coordination:  There is no decremation with RAM's. Normal finger to nose  Gait and Station: The patient has no difficulty arising out of a deep-seated chair without the use of the hands. The patient's stride length is good. Flexes forward. Uses cane for stability.  Gait is cautious and narrow.  Thank you for allowing us  the opportunity to participate in the care of this nice patient. Please do not hesitate to contact us  for any questions or concerns.   Total time spent on today's visit was 30 minutes dedicated to this patient today, preparing to see patient, examining the patient, ordering tests and/or medications and counseling the patient, documenting clinical information in the EHR or other health record, independently interpreting results and communicating results to the patient/family, discussing treatment and goals, answering patient's questions and coordinating care.  Cc:  Clarice Nottingham, MD  Camie Sevin 07/05/2024 4:59 PM

## 2024-08-09 ENCOUNTER — Telehealth: Payer: Self-pay | Admitting: Physical Medicine and Rehabilitation

## 2024-08-09 ENCOUNTER — Other Ambulatory Visit: Payer: Self-pay | Admitting: Physical Medicine and Rehabilitation

## 2024-08-09 DIAGNOSIS — M48062 Spinal stenosis, lumbar region with neurogenic claudication: Secondary | ICD-10-CM

## 2024-08-09 DIAGNOSIS — M5416 Radiculopathy, lumbar region: Secondary | ICD-10-CM

## 2024-08-09 NOTE — Telephone Encounter (Signed)
 Pt's wife called requesting an appt for back injection. Last injection 02/16/24. Please call pt's wife  [name] at [phone].

## 2024-08-10 ENCOUNTER — Ambulatory Visit: Admitting: Orthopedic Surgery

## 2024-08-10 DIAGNOSIS — M170 Bilateral primary osteoarthritis of knee: Secondary | ICD-10-CM | POA: Diagnosis not present

## 2024-08-11 ENCOUNTER — Encounter: Payer: Self-pay | Admitting: Orthopedic Surgery

## 2024-08-11 DIAGNOSIS — M170 Bilateral primary osteoarthritis of knee: Secondary | ICD-10-CM

## 2024-08-11 MED ORDER — METHYLPREDNISOLONE ACETATE 40 MG/ML IJ SUSP
40.0000 mg | INTRAMUSCULAR | Status: AC | PRN
  Administered 2024-08-11: 40 mg via INTRA_ARTICULAR

## 2024-08-11 MED ORDER — LIDOCAINE HCL 1 % IJ SOLN
5.0000 mL | INTRAMUSCULAR | Status: AC | PRN
  Administered 2024-08-11: 5 mL

## 2024-08-11 NOTE — Progress Notes (Signed)
 Office Visit Note   Patient: Rick Melendez           Date of Birth: 02-24-1936           MRN: [id number] Visit Date: 08/10/2024              Requested by: Clarice Nottingham, MD [address] [place],  [place] [address] PCP: Clarice Nottingham, MD  Chief Complaint  Patient presents with   Left Knee - Follow-up   Right Knee - Follow-up      HPI: Patient is a 88 year old gentleman with osteoarthritis both knees.  Patient has had temporary relief with previous injections.  Assessment & Plan: Visit Diagnoses:  1. Bilateral primary osteoarthritis of knee     Plan: Both knees were injected he tolerated this well continue with his compression socks.  Follow-Up Instructions: Return in about 3 months (around 11/10/2024).   Ortho Exam  Patient is alert, oriented, no adenopathy, well-dressed, normal affect, normal respiratory effort. Examination patient has crepitation with range of motion of both knees collaterals cruciates are stable.  There is a mild effusion.  There is venous stasis insufficiency ulcer left medial ankle.  Patient has good pulses.    Imaging: No results found. No images are attached to the encounter.  Labs: Lab Results  Component Value Date   HGBA1C 6.9 (H) 11/01/2023   ESRSEDRATE 6 02/10/2024   CRP <3.0 02/10/2024   LABURIC 5.2 02/10/2024   REPTSTATUS 03/24/2017 FINAL 03/19/2017   CULT NO GROWTH 5 DAYS 03/19/2017     Lab Results  Component Value Date   ALBUMIN 3.3 (L) 11/01/2023    Lab Results  Component Value Date   MG 1.5 (L) 03/19/2017   No results found for: VD25OH  No results found for: PREALBUMIN    Latest Ref Rng & Units 02/10/2024    1:48 PM 12/02/2023   12:51 PM 11/03/2023    4:27 AM  CBC EXTENDED  WBC 3.8 - 10.8 Thousand/uL 10.8  10.2  11.5   RBC 4.20 - 5.80 Million/uL 5.55  5.73  5.87   Hemoglobin 13.2 - 17.1 g/dL 85.9  85.8  85.2   HCT 38.5 - 50.0 % 46.2  47.9  48.1   Platelets 140 - 400 Thousand/uL 230  315   231   NEUT# 1,500 - 7,800 cells/uL 6,491        There is no height or weight on file to calculate BMI.  Orders:  No orders of the defined types were placed in this encounter.  No orders of the defined types were placed in this encounter.    Procedures: Large Joint Inj: bilateral knee on 08/11/2024 3:49 PM Indications: pain and diagnostic evaluation Details: 22 G 1.5 in needle, anteromedial approach  Arthrogram: No  Medications (Right): 5 mL lidocaine  1 %; 40 mg methylPREDNISolone  acetate 40 MG/ML Medications (Left): 5 mL lidocaine  1 %; 40 mg methylPREDNISolone  acetate 40 MG/ML Outcome: tolerated well, no immediate complications Procedure, treatment alternatives, risks and benefits explained, specific risks discussed. Consent was given by the patient. Immediately prior to procedure a time out was called to verify the correct patient, procedure, equipment, support staff and site/side marked as required. Patient was prepped and draped in the usual sterile fashion.      Clinical Data: No additional findings.  ROS:  All other systems negative, except as noted in the HPI. Review of Systems  Objective: Vital Signs: There were no vitals taken for this visit.  Specialty Comments:  EXAM: MRI LUMBAR SPINE WITHOUT CONTRAST   TECHNIQUE: Multiplanar, multisequence MR imaging of the lumbar spine was performed. No intravenous contrast was administered.   COMPARISON:  Lumbar MRI 05/27/2014   FINDINGS: Segmentation:  Normal.  Lowest disc space L5-S1   Alignment: Mild anterolisthesis T12-L1. 4 mm retrolisthesis L1-2. Slight retrolisthesis L3-4. 6 mm anterolisthesis L4-5. Mild retrolisthesis L5-S1.   Vertebrae:  Normal bone marrow.  Negative for fracture or mass.   Conus medullaris and cauda equina: Conus extends to the L1-2 level. Conus and cauda equina appear normal.   Paraspinal and other soft tissues: Negative for mass or adenopathy. No soft tissue edema or fluid  collection.   Disc levels:   T12-L1: Advanced disc degeneration left greater than right. Prominent left-sided spurring. Bilateral facet degeneration. Moderate spinal stenosis. Severe subarticular and foraminal stenosis on the left. Mild subarticular stenosis on the right. Mild progression of stenosis on the left since the prior study   L1-2: Advanced disc degeneration with diffuse endplate spurring and marked disc space narrowing. Mild facet degeneration. Mild to moderate spinal stenosis. Moderate subarticular stenosis bilaterally. No interval change   L2-3: Disc degeneration with diffuse disc bulging and endplate spurring. Mild facet degeneration. Mild subarticular stenosis bilaterally with mild progression   L3-4: Disc degeneration with diffuse disc bulging and spurring, right greater than left. Mild facet degeneration. Mild spinal stenosis. Moderate to severe right subarticular stenosis. Moderate left subarticular stenosis. No significant change   L4-5: 6 mm anterolisthesis with severe facet degeneration and diffuse bulging of the disc. Severe spinal stenosis and severe subarticular stenosis bilaterally have progressed since the prior study. In addition, there is severe right foraminal encroachment with right L4 nerve root impingement and moderate left foraminal encroachment.   L5-S1: Left paracentral disc protrusion with compression of the left S1 nerve root similar to the prior study. Mild facet degeneration bilaterally.   IMPRESSION: Advanced multilevel degenerative change throughout the lumbar spine. There has been progression of degenerative changes stenosis at multiple levels as described above   6 mm anterolisthesis L4-5. Severe spinal and severe subarticular stenosis bilaterally have progressed in the interval. Severe right foraminal encroachment.     Electronically Signed   By: Carlin Gaskins M.D.   On: 08/22/2020 21:23  PMFS History: Patient Active  Problem List   Diagnosis Date Noted   Persistent atrial fibrillation (HCC) 12/02/2023   Hypercoagulable state due to persistent atrial fibrillation (HCC) 12/02/2023   Acute stroke due to ischemia (HCC) 11/02/2023   DOE (dyspnea on exertion) 08/11/2023   Upper airway cough syndrome 08/11/2023   Slow heart rate 04/14/2022   Influenza B    AKI (acute kidney injury) (HCC)    CAP (community acquired pneumonia) 03/19/2017   Bilateral primary osteoarthritis of knee 01/28/2017   Impingement syndrome of right shoulder 01/28/2017   CAD - CABG X 2 9/07 LIMA-LAD, SVG-OM. Low risk Myoview 7/12 09/07/2013   Essential hypertension 09/07/2013   Dyslipidemia 09/07/2013   Diabetes mellitus (HCC) 09/07/2013   Chronic renal insufficiency, stage III (moderate)- SCr 1.6 (Aug 2013) 09/07/2013   Sleep apnea- compliant with C-pap 09/07/2013   PVD (peripheral vascular disease)- Rt iliac disease by doppler 7/13 09/07/2013   Past Medical History:  Diagnosis Date   Arthritis    Coronary artery disease 2007   cabg x3   Diabetes mellitus without complication (HCC)    Hyperlipidemia    Hypertension    Influenza B 03/19/2017   OSA on CPAP    Dr BURNARD -  follows and treatmentof sleep apnea   PVD (peripheral vascular disease) (HCC) 2003   DR BERRY-  -LEFT LEG STENTING    Family History  Problem Relation Age of Onset   Cancer Father    Cancer Brother     Past Surgical History:  Procedure Laterality Date   CARDIAC CATHETERIZATION  0911/2007   3 vessel CAD WITH LEFT MAIN CAD SEVERE ,norm lLIMA,LV nom. ,evidence for very mild aortic stenosis with gradient, mild stenosis distal abdnminal aotra and proximal left common iliac artery 30%   CARDIOVERSION N/A 12/06/2023   Procedure: CARDIOVERSION;  Surgeon: Pietro Redell RAMAN, MD;  Location: [facility];  Service: Cardiovascular;  Laterality: N/A;   CORONARY ARTERY BYPASS GRAFT  08/31/2006   DR DIANNIA to LAD, VEIN TO AN OBTUSE MARGINAL BRANCH AND   RIGHT CORONARY ARTERY   CPET  04/13/2012   normal   DOPPLER ECHOCARDIOGRAPHY  07/08/2011   EF =>55%   lower arterial doppler  07/01/2012   mildly abnormal lower extremity;ABIs >1 bilaterally with moderately high-grade right iliac stenosis that had not changed from proir study.   NM MYOCAR PERF WALL MOTION  7/182012   EF 59%, normal myocardial perfusio   Social History   Occupational History   Not on file  Tobacco Use   Smoking status: Former    Current packs/day: 0.00    Types: Cigarettes    Quit date: 09/03/1981    Years since quitting: 42.9   Smokeless tobacco: Current    Types: Chew  Substance and Sexual Activity   Alcohol use: Yes   Drug use: No   Sexual activity: Not on file

## 2024-08-23 ENCOUNTER — Ambulatory Visit: Admitting: Physician Assistant

## 2024-09-05 ENCOUNTER — Ambulatory Visit: Admitting: Physical Medicine and Rehabilitation

## 2024-09-05 ENCOUNTER — Other Ambulatory Visit: Payer: Self-pay

## 2024-09-05 VITALS — BP 136/76 | HR 76

## 2024-09-05 DIAGNOSIS — M5416 Radiculopathy, lumbar region: Secondary | ICD-10-CM

## 2024-09-05 MED ORDER — METHYLPREDNISOLONE ACETATE 80 MG/ML IJ SUSP
40.0000 mg | Freq: Once | INTRAMUSCULAR | Status: AC
  Administered 2024-09-05: 40 mg

## 2024-09-05 NOTE — Progress Notes (Signed)
 Pain Scale   Average Pain 6 Patient advising he has lower back pain and pain increases when walking and standing and does decrease when sitting and resting        +Driver, -BT, -Dye Allergies.

## 2024-09-06 DIAGNOSIS — L821 Other seborrheic keratosis: Secondary | ICD-10-CM | POA: Diagnosis not present

## 2024-09-06 DIAGNOSIS — C44319 Basal cell carcinoma of skin of other parts of face: Secondary | ICD-10-CM | POA: Diagnosis not present

## 2024-09-06 DIAGNOSIS — Z85828 Personal history of other malignant neoplasm of skin: Secondary | ICD-10-CM | POA: Diagnosis not present

## 2024-09-06 DIAGNOSIS — L570 Actinic keratosis: Secondary | ICD-10-CM | POA: Diagnosis not present

## 2024-09-06 DIAGNOSIS — C44311 Basal cell carcinoma of skin of nose: Secondary | ICD-10-CM | POA: Diagnosis not present

## 2024-09-07 DIAGNOSIS — E118 Type 2 diabetes mellitus with unspecified complications: Secondary | ICD-10-CM | POA: Diagnosis not present

## 2024-09-07 DIAGNOSIS — E039 Hypothyroidism, unspecified: Secondary | ICD-10-CM | POA: Diagnosis not present

## 2024-09-07 DIAGNOSIS — E7800 Pure hypercholesterolemia, unspecified: Secondary | ICD-10-CM | POA: Diagnosis not present

## 2024-09-12 DIAGNOSIS — I480 Paroxysmal atrial fibrillation: Secondary | ICD-10-CM | POA: Diagnosis not present

## 2024-09-12 DIAGNOSIS — Z0000 Encounter for general adult medical examination without abnormal findings: Secondary | ICD-10-CM | POA: Diagnosis not present

## 2024-09-12 DIAGNOSIS — K219 Gastro-esophageal reflux disease without esophagitis: Secondary | ICD-10-CM | POA: Diagnosis not present

## 2024-09-12 DIAGNOSIS — I10 Essential (primary) hypertension: Secondary | ICD-10-CM | POA: Diagnosis not present

## 2024-09-12 DIAGNOSIS — I739 Peripheral vascular disease, unspecified: Secondary | ICD-10-CM | POA: Diagnosis not present

## 2024-09-12 DIAGNOSIS — I6523 Occlusion and stenosis of bilateral carotid arteries: Secondary | ICD-10-CM | POA: Diagnosis not present

## 2024-09-12 DIAGNOSIS — E1169 Type 2 diabetes mellitus with other specified complication: Secondary | ICD-10-CM | POA: Diagnosis not present

## 2024-09-12 DIAGNOSIS — G3184 Mild cognitive impairment, so stated: Secondary | ICD-10-CM | POA: Diagnosis not present

## 2024-09-12 DIAGNOSIS — I2510 Atherosclerotic heart disease of native coronary artery without angina pectoris: Secondary | ICD-10-CM | POA: Diagnosis not present

## 2024-09-12 DIAGNOSIS — E039 Hypothyroidism, unspecified: Secondary | ICD-10-CM | POA: Diagnosis not present

## 2024-09-12 DIAGNOSIS — N1831 Chronic kidney disease, stage 3a: Secondary | ICD-10-CM | POA: Diagnosis not present

## 2024-09-12 DIAGNOSIS — Z23 Encounter for immunization: Secondary | ICD-10-CM | POA: Diagnosis not present

## 2024-09-12 NOTE — Procedures (Signed)
 Lumbosacral Transforaminal Epidural Steroid Injection - Sub-Pedicular Approach with Fluoroscopic Guidance  Patient: Rick Melendez      Date of Birth: [birth year] MRN: [id number] PCP: Clarice Nottingham, MD      Visit Date: 09/05/2024   Universal Protocol:    Date/Time: 09/05/2024  Consent Given By: the patient  Position: PRONE  Additional Comments: Vital signs were monitored before and after the procedure. Patient was prepped and draped in the usual sterile fashion. The correct patient, procedure, and site was verified.   Injection Procedure Details:   Procedure diagnoses: Lumbar radiculopathy [M54.16]    Meds Administered:  Meds ordered this encounter  Medications   methylPREDNISolone  acetate (DEPO-MEDROL ) injection 40 mg    Laterality: Bilateral  Location/Site: L4  Needle:5.0 in., 22 ga.  Short bevel or Quincke spinal needle  Needle Placement: Transforaminal  Findings:    -Comments: Excellent flow of contrast along the nerve, nerve root and into the epidural space.  Procedure Details: After squaring off the end-plates to get a true AP view, the C-arm was positioned so that an oblique view of the foramen as noted above was visualized. The target area is just inferior to the nose of the scotty dog or sub pedicular. The soft tissues overlying this structure were infiltrated with 2-3 ml. of 1% Lidocaine  without Epinephrine.  The spinal needle was inserted toward the target using a trajectory view along the fluoroscope beam.  Under AP and lateral visualization, the needle was advanced so it did not puncture dura and was located close the 6 O'Clock position of the pedical in AP tracterory. Biplanar projections were used to confirm position. Aspiration was confirmed to be negative for CSF and/or blood. A 1-2 ml. volume of Isovue-250 was injected and flow of contrast was noted at each level. Radiographs were obtained for documentation purposes.   After attaining the desired  flow of contrast documented above, a 0.5 to 1.0 ml test dose of 0.25% Marcaine  was injected into each respective transforaminal space.  The patient was observed for 90 seconds post injection.  After no sensory deficits were reported, and normal lower extremity motor function was noted,   the above injectate was administered so that equal amounts of the injectate were placed at each foramen (level) into the transforaminal epidural space.   Additional Comments:  The patient tolerated the procedure well Dressing: 2 x 2 sterile gauze and Band-Aid    Post-procedure details: Patient was observed during the procedure. Post-procedure instructions were reviewed.  Patient left the clinic in stable condition.

## 2024-09-12 NOTE — Progress Notes (Signed)
 Rick Melendez - 88 y.o. male MRN [id number]  Date of birth: [birth year]  Office Visit Note: Visit Date: 09/05/2024 PCP: Clarice Nottingham, MD Referred by: Clarice Nottingham, MD  Subjective: Chief Complaint  Patient presents with   Lower Back - Pain   HPI:  Rick Melendez is a 88 y.o. male who comes in today for planned repeat Bilateral L4-5  Lumbar Transforaminal epidural steroid injection with fluoroscopic guidance.  The patient has failed conservative care including home exercise, medications, time and activity modification.  This injection will be diagnostic and hopefully therapeutic.  Please see requesting physician notes for further details and justification. Patient received more than 50% pain relief from prior injection.   Referring: Duwaine Pouch, FNP   ROS Otherwise per HPI.  Assessment & Plan: Visit Diagnoses:    ICD-10-CM   1. Lumbar radiculopathy  M54.16 XR C-ARM NO REPORT    Epidural Steroid injection    methylPREDNISolone  acetate (DEPO-MEDROL ) injection 40 mg      Plan: No additional findings.   Meds & Orders:  Meds ordered this encounter  Medications   methylPREDNISolone  acetate (DEPO-MEDROL ) injection 40 mg    Orders Placed This Encounter  Procedures   XR C-ARM NO REPORT   Epidural Steroid injection    Follow-up: Return for visit to requesting provider as needed.   Procedures: No procedures performed  Lumbosacral Transforaminal Epidural Steroid Injection - Sub-Pedicular Approach with Fluoroscopic Guidance  Patient: Rick Melendez      Date of Birth: 1936/09/14 MRN: [id number] PCP: Clarice Nottingham, MD      Visit Date: 09/05/2024   Universal Protocol:    Date/Time: 09/05/2024  Consent Given By: the patient  Position: PRONE  Additional Comments: Vital signs were monitored before and after the procedure. Patient was prepped and draped in the usual sterile fashion. The correct patient, procedure, and site was verified.   Injection Procedure Details:    Procedure diagnoses: Lumbar radiculopathy [M54.16]    Meds Administered:  Meds ordered this encounter  Medications   methylPREDNISolone  acetate (DEPO-MEDROL ) injection 40 mg    Laterality: Bilateral  Location/Site: L4  Needle:5.0 in., 22 ga.  Short bevel or Quincke spinal needle  Needle Placement: Transforaminal  Findings:    -Comments: Excellent flow of contrast along the nerve, nerve root and into the epidural space.  Procedure Details: After squaring off the end-plates to get a true AP view, the C-arm was positioned so that an oblique view of the foramen as noted above was visualized. The target area is just inferior to the nose of the scotty dog or sub pedicular. The soft tissues overlying this structure were infiltrated with 2-3 ml. of 1% Lidocaine  without Epinephrine.  The spinal needle was inserted toward the target using a trajectory view along the fluoroscope beam.  Under AP and lateral visualization, the needle was advanced so it did not puncture dura and was located close the 6 O'Clock position of the pedical in AP tracterory. Biplanar projections were used to confirm position. Aspiration was confirmed to be negative for CSF and/or blood. A 1-2 ml. volume of Isovue-250 was injected and flow of contrast was noted at each level. Radiographs were obtained for documentation purposes.   After attaining the desired flow of contrast documented above, a 0.5 to 1.0 ml test dose of 0.25% Marcaine  was injected into each respective transforaminal space.  The patient was observed for 90 seconds post injection.  After no sensory deficits were reported, and normal lower extremity motor  function was noted,   the above injectate was administered so that equal amounts of the injectate were placed at each foramen (level) into the transforaminal epidural space.   Additional Comments:  The patient tolerated the procedure well Dressing: 2 x 2 sterile gauze and Band-Aid    Post-procedure  details: Patient was observed during the procedure. Post-procedure instructions were reviewed.  Patient left the clinic in stable condition.    Clinical History: EXAM: MRI LUMBAR SPINE WITHOUT CONTRAST   TECHNIQUE: Multiplanar, multisequence MR imaging of the lumbar spine was performed. No intravenous contrast was administered.   COMPARISON:  Lumbar MRI 05/27/2014   FINDINGS: Segmentation:  Normal.  Lowest disc space L5-S1   Alignment: Mild anterolisthesis T12-L1. 4 mm retrolisthesis L1-2. Slight retrolisthesis L3-4. 6 mm anterolisthesis L4-5. Mild retrolisthesis L5-S1.   Vertebrae:  Normal bone marrow.  Negative for fracture or mass.   Conus medullaris and cauda equina: Conus extends to the L1-2 level. Conus and cauda equina appear normal.   Paraspinal and other soft tissues: Negative for mass or adenopathy. No soft tissue edema or fluid collection.   Disc levels:   T12-L1: Advanced disc degeneration left greater than right. Prominent left-sided spurring. Bilateral facet degeneration. Moderate spinal stenosis. Severe subarticular and foraminal stenosis on the left. Mild subarticular stenosis on the right. Mild progression of stenosis on the left since the prior study   L1-2: Advanced disc degeneration with diffuse endplate spurring and marked disc space narrowing. Mild facet degeneration. Mild to moderate spinal stenosis. Moderate subarticular stenosis bilaterally. No interval change   L2-3: Disc degeneration with diffuse disc bulging and endplate spurring. Mild facet degeneration. Mild subarticular stenosis bilaterally with mild progression   L3-4: Disc degeneration with diffuse disc bulging and spurring, right greater than left. Mild facet degeneration. Mild spinal stenosis. Moderate to severe right subarticular stenosis. Moderate left subarticular stenosis. No significant change   L4-5: 6 mm anterolisthesis with severe facet degeneration and diffuse bulging  of the disc. Severe spinal stenosis and severe subarticular stenosis bilaterally have progressed since the prior study. In addition, there is severe right foraminal encroachment with right L4 nerve root impingement and moderate left foraminal encroachment.   L5-S1: Left paracentral disc protrusion with compression of the left S1 nerve root similar to the prior study. Mild facet degeneration bilaterally.   IMPRESSION: Advanced multilevel degenerative change throughout the lumbar spine. There has been progression of degenerative changes stenosis at multiple levels as described above   6 mm anterolisthesis L4-5. Severe spinal and severe subarticular stenosis bilaterally have progressed in the interval. Severe right foraminal encroachment.     Electronically Signed   By: Carlin Gaskins M.D.   On: 08/22/2020 21:23     Objective:  VS:  HT:    WT:   BMI:     BP:136/76  HR:76bpm  TEMP: ( )  RESP:  Physical Exam Vitals and nursing note reviewed.  Constitutional:      General: He is not in acute distress.    Appearance: Normal appearance. He is not ill-appearing.  HENT:     Head: Normocephalic and atraumatic.     Right Ear: External ear normal.     Left Ear: External ear normal.     Nose: No congestion.  Eyes:     Extraocular Movements: Extraocular movements intact.  Cardiovascular:     Rate and Rhythm: Normal rate.     Pulses: Normal pulses.  Pulmonary:     Effort: Pulmonary effort is normal. No respiratory  distress.  Abdominal:     General: There is no distension.     Palpations: Abdomen is soft.  Musculoskeletal:        General: No tenderness or signs of injury.     Cervical back: Neck supple.     Right lower leg: No edema.     Left lower leg: No edema.     Comments: Patient has good distal strength without clonus.  Skin:    Findings: No erythema or rash.  Neurological:     General: No focal deficit present.     Mental Status: He is alert and oriented to  person, place, and time.     Sensory: No sensory deficit.     Motor: No weakness or abnormal muscle tone.     Coordination: Coordination normal.  Psychiatric:        Mood and Affect: Mood normal.        Behavior: Behavior normal.      Imaging: No results found.

## 2024-09-15 ENCOUNTER — Ambulatory Visit: Admitting: Podiatry

## 2024-09-15 ENCOUNTER — Encounter: Payer: Self-pay | Admitting: Podiatry

## 2024-09-15 DIAGNOSIS — B351 Tinea unguium: Secondary | ICD-10-CM | POA: Diagnosis not present

## 2024-09-15 DIAGNOSIS — Z7901 Long term (current) use of anticoagulants: Secondary | ICD-10-CM

## 2024-09-15 DIAGNOSIS — M79674 Pain in right toe(s): Secondary | ICD-10-CM | POA: Diagnosis not present

## 2024-09-15 DIAGNOSIS — M79675 Pain in left toe(s): Secondary | ICD-10-CM

## 2024-09-15 NOTE — Progress Notes (Signed)
 Subjective: Chief Complaint  Patient presents with   Diabetes    DFC NIDDM A1C 6.9. Toenail trim.    88 year old male presents the office today for concerns of thick, elongated nails not able to trim himself.  No open lesions.  No injuries  He is on Eliquis    Objective: AAO x3, NAD DP/PT pulses palpable bilaterally, CRT less than 3 seconds Nails are hypertrophic, dystrophic, brittle, discolored, elongated 10. No surrounding redness or drainage. Tenderness nails 1-5 bilaterally. No open lesions or pre-ulcerative lesions are identified today. No signfiant callus formation today. Dry skin present No pain with calf compression, swelling, warmth, erythema  Assessment: Symptomatic onychomycosis, on Eliquis    Plan: -All treatment options discussed with the patient including all alternatives, risks, complications.  -Toenails sharply debrided x 10 without any complications or bleeding. -Discussed moisturizers first week. -Patient encouraged to call the office with any questions, concerns, change in symptoms.   Return in about 3 months (around 12/15/2024).  Donnice JONELLE Fees DPM

## 2024-09-15 NOTE — Patient Instructions (Signed)
  Choose a moisturizer from  the list below:  For normal skin: Moisturize feet once daily; do not apply between toes CeraVe Daily Moisturizing Lotion Lubriderm Advanced Therapy Lotion or Lubriderm Intense Skin Repair Lotion Aquaphor Intensive Repair Lotion Gold Bond Ultimate Diabetic Foot Lotion Eucerin Intensive Repair Moisturizing Lotion  For extremely dry, cracked feet: moisturize feet once daily; do not apply between toes Bag Balm Skin Moisturizer (green tin can) CeraVe Healing Ointment Eucerin Aquaphor Advanced Repair Cream Vaseline Petroleum Healing Jelly   If you have problems reaching your feet: apply to feet once daily; do not apply between toes Aquaphor Advanced Therapy Ointment Body Spray  Vaseline Intensive Care Spray Moisturizer (Unscented,  Cocoa Radiant Spray or Aloe Smooth Spray)

## 2024-09-18 DIAGNOSIS — S01511A Laceration without foreign body of lip, initial encounter: Secondary | ICD-10-CM | POA: Diagnosis not present

## 2024-10-23 ENCOUNTER — Telehealth: Payer: Self-pay | Admitting: Physical Medicine and Rehabilitation

## 2024-10-23 ENCOUNTER — Other Ambulatory Visit: Payer: Self-pay | Admitting: Physical Medicine and Rehabilitation

## 2024-10-23 DIAGNOSIS — M5416 Radiculopathy, lumbar region: Secondary | ICD-10-CM

## 2024-10-23 DIAGNOSIS — M48062 Spinal stenosis, lumbar region with neurogenic claudication: Secondary | ICD-10-CM

## 2024-10-23 NOTE — Telephone Encounter (Signed)
 Pt's wife called requesting an appt with Newton. Last injection 09/05/24. She states pt coming up on his 3 month injection. Please call pt's wife [name] at [phone] or [phone].

## 2024-11-01 ENCOUNTER — Other Ambulatory Visit: Payer: Self-pay | Admitting: Emergency Medicine

## 2024-11-06 ENCOUNTER — Other Ambulatory Visit: Payer: Self-pay

## 2024-11-06 ENCOUNTER — Ambulatory Visit: Admitting: Physical Medicine and Rehabilitation

## 2024-11-06 VITALS — BP 144/69 | HR 72

## 2024-11-06 DIAGNOSIS — M5416 Radiculopathy, lumbar region: Secondary | ICD-10-CM

## 2024-11-06 DIAGNOSIS — M48062 Spinal stenosis, lumbar region with neurogenic claudication: Secondary | ICD-10-CM

## 2024-11-06 MED ORDER — METHYLPREDNISOLONE ACETATE 40 MG/ML IJ SUSP
40.0000 mg | Freq: Once | INTRAMUSCULAR | Status: AC
  Administered 2024-11-06: 40 mg

## 2024-11-06 NOTE — Progress Notes (Signed)
 Rick Melendez - 88 y.o. male MRN [id number]  Date of birth: April 19, 1936  Office Visit Note: Visit Date: 11/06/2024 PCP: Clarice Nottingham, MD Referred by: Clarice Nottingham, MD  Subjective: Chief Complaint  Patient presents with   Lower Back - Pain   HPI:  Rick Melendez is a 88 y.o. male who comes in today for planned repeat Bilateral L4-5  Lumbar Transforaminal epidural steroid injection with fluoroscopic guidance.  The patient has failed conservative care including home exercise, medications, time and activity modification.  This injection will be diagnostic and hopefully therapeutic.  Please see requesting physician notes for further details and justification. Patient received more than 50% pain relief from prior injection.   Referring: Duwaine Pouch, FNP   ROS Otherwise per HPI.  Assessment & Plan: Visit Diagnoses:    ICD-10-CM   1. Lumbar radiculopathy  M54.16 Epidural Steroid injection    methylPREDNISolone  acetate (DEPO-MEDROL ) injection 40 mg    XR C-ARM NO REPORT    CANCELED: XR C-ARM NO REPORT    2. Spinal stenosis of lumbar region with neurogenic claudication  M48.062       Plan: No additional findings.   Meds & Orders:  Meds ordered this encounter  Medications   methylPREDNISolone  acetate (DEPO-MEDROL ) injection 40 mg    Orders Placed This Encounter  Procedures   XR C-ARM NO REPORT   Epidural Steroid injection    Follow-up: No follow-ups on file.   Procedures: No procedures performed  Lumbosacral Transforaminal Epidural Steroid Injection - Sub-Pedicular Approach with Fluoroscopic Guidance  Patient: Rick Melendez      Date of Birth: [birth year] MRN: [id number] PCP: Clarice Nottingham, MD      Visit Date: 11/06/2024   Universal Protocol:    Date/Time: 11/06/2024  Consent Given By: the patient  Position: PRONE  Additional Comments: Vital signs were monitored before and after the procedure. Patient was prepped and draped in the usual sterile fashion. The  correct patient, procedure, and site was verified.   Injection Procedure Details:   Procedure diagnoses: Lumbar radiculopathy [M54.16]    Meds Administered:  Meds ordered this encounter  Medications   methylPREDNISolone  acetate (DEPO-MEDROL ) injection 40 mg    Laterality: Bilateral  Location/Site: L4  Needle:5.0 in., 22 ga.  Short bevel or Quincke spinal needle  Needle Placement: Transforaminal  Findings:    -Comments: Excellent flow of contrast along the nerve, nerve root and into the epidural space.  Procedure Details: After squaring off the end-plates to get a true AP view, the C-arm was positioned so that an oblique view of the foramen as noted above was visualized. The target area is just inferior to the nose of the scotty dog or sub pedicular. The soft tissues overlying this structure were infiltrated with 2-3 ml. of 1% Lidocaine  without Epinephrine.  The spinal needle was inserted toward the target using a trajectory view along the fluoroscope beam.  Under AP and lateral visualization, the needle was advanced so it did not puncture dura and was located close the 6 O'Clock position of the pedical in AP tracterory. Biplanar projections were used to confirm position. Aspiration was confirmed to be negative for CSF and/or blood. A 1-2 ml. volume of Isovue-250 was injected and flow of contrast was noted at each level. Radiographs were obtained for documentation purposes.   After attaining the desired flow of contrast documented above, a 0.5 to 1.0 ml test dose of 0.25% Marcaine  was injected into each respective transforaminal space.  The patient  was observed for 90 seconds post injection.  After no sensory deficits were reported, and normal lower extremity motor function was noted,   the above injectate was administered so that equal amounts of the injectate were placed at each foramen (level) into the transforaminal epidural space.   Additional Comments:  The patient  tolerated the procedure well Dressing: 2 x 2 sterile gauze and Band-Aid    Post-procedure details: Patient was observed during the procedure. Post-procedure instructions were reviewed.  Patient left the clinic in stable condition.    Clinical History: EXAM: MRI LUMBAR SPINE WITHOUT CONTRAST   TECHNIQUE: Multiplanar, multisequence MR imaging of the lumbar spine was performed. No intravenous contrast was administered.   COMPARISON:  Lumbar MRI 05/27/2014   FINDINGS: Segmentation:  Normal.  Lowest disc space L5-S1   Alignment: Mild anterolisthesis T12-L1. 4 mm retrolisthesis L1-2. Slight retrolisthesis L3-4. 6 mm anterolisthesis L4-5. Mild retrolisthesis L5-S1.   Vertebrae:  Normal bone marrow.  Negative for fracture or mass.   Conus medullaris and cauda equina: Conus extends to the L1-2 level. Conus and cauda equina appear normal.   Paraspinal and other soft tissues: Negative for mass or adenopathy. No soft tissue edema or fluid collection.   Disc levels:   T12-L1: Advanced disc degeneration left greater than right. Prominent left-sided spurring. Bilateral facet degeneration. Moderate spinal stenosis. Severe subarticular and foraminal stenosis on the left. Mild subarticular stenosis on the right. Mild progression of stenosis on the left since the prior study   L1-2: Advanced disc degeneration with diffuse endplate spurring and marked disc space narrowing. Mild facet degeneration. Mild to moderate spinal stenosis. Moderate subarticular stenosis bilaterally. No interval change   L2-3: Disc degeneration with diffuse disc bulging and endplate spurring. Mild facet degeneration. Mild subarticular stenosis bilaterally with mild progression   L3-4: Disc degeneration with diffuse disc bulging and spurring, right greater than left. Mild facet degeneration. Mild spinal stenosis. Moderate to severe right subarticular stenosis. Moderate left subarticular stenosis. No  significant change   L4-5: 6 mm anterolisthesis with severe facet degeneration and diffuse bulging of the disc. Severe spinal stenosis and severe subarticular stenosis bilaterally have progressed since the prior study. In addition, there is severe right foraminal encroachment with right L4 nerve root impingement and moderate left foraminal encroachment.   L5-S1: Left paracentral disc protrusion with compression of the left S1 nerve root similar to the prior study. Mild facet degeneration bilaterally.   IMPRESSION: Advanced multilevel degenerative change throughout the lumbar spine. There has been progression of degenerative changes stenosis at multiple levels as described above   6 mm anterolisthesis L4-5. Severe spinal and severe subarticular stenosis bilaterally have progressed in the interval. Severe right foraminal encroachment.     Electronically Signed   By: Carlin Gaskins M.D.   On: 08/22/2020 21:23     Objective:  VS:  HT:    WT:   BMI:     BP:(!) 144/69  HR:72bpm  TEMP: ( )  RESP:  Physical Exam Vitals and nursing note reviewed.  Constitutional:      General: He is not in acute distress.    Appearance: Normal appearance. He is not ill-appearing.  HENT:     Head: Normocephalic and atraumatic.     Right Ear: External ear normal.     Left Ear: External ear normal.     Nose: No congestion.  Eyes:     Extraocular Movements: Extraocular movements intact.  Cardiovascular:     Rate and Rhythm: Normal rate.  Pulses: Normal pulses.  Pulmonary:     Effort: Pulmonary effort is normal. No respiratory distress.  Abdominal:     General: There is no distension.     Palpations: Abdomen is soft.  Musculoskeletal:        General: No tenderness or signs of injury.     Cervical back: Neck supple.     Right lower leg: No edema.     Left lower leg: No edema.     Comments: Patient has good distal strength without clonus.  Skin:    Findings: No erythema or rash.   Neurological:     General: No focal deficit present.     Mental Status: He is alert and oriented to person, place, and time.     Sensory: No sensory deficit.     Motor: No weakness or abnormal muscle tone.     Coordination: Coordination normal.  Psychiatric:        Mood and Affect: Mood normal.        Behavior: Behavior normal.      Imaging: No results found.

## 2024-11-06 NOTE — Procedures (Signed)
 Lumbosacral Transforaminal Epidural Steroid Injection - Sub-Pedicular Approach with Fluoroscopic Guidance  Patient: Rick Melendez      Date of Birth: 12-03-1936 MRN: [id number] PCP: Clarice Nottingham, MD      Visit Date: 11/06/2024   Universal Protocol:    Date/Time: 11/06/2024  Consent Given By: the patient  Position: PRONE  Additional Comments: Vital signs were monitored before and after the procedure. Patient was prepped and draped in the usual sterile fashion. The correct patient, procedure, and site was verified.   Injection Procedure Details:   Procedure diagnoses: Lumbar radiculopathy [M54.16]    Meds Administered:  Meds ordered this encounter  Medications   methylPREDNISolone  acetate (DEPO-MEDROL ) injection 40 mg    Laterality: Bilateral  Location/Site: L4  Needle:5.0 in., 22 ga.  Short bevel or Quincke spinal needle  Needle Placement: Transforaminal  Findings:    -Comments: Excellent flow of contrast along the nerve, nerve root and into the epidural space.  Procedure Details: After squaring off the end-plates to get a true AP view, the C-arm was positioned so that an oblique view of the foramen as noted above was visualized. The target area is just inferior to the nose of the scotty dog or sub pedicular. The soft tissues overlying this structure were infiltrated with 2-3 ml. of 1% Lidocaine  without Epinephrine.  The spinal needle was inserted toward the target using a trajectory view along the fluoroscope beam.  Under AP and lateral visualization, the needle was advanced so it did not puncture dura and was located close the 6 O'Clock position of the pedical in AP tracterory. Biplanar projections were used to confirm position. Aspiration was confirmed to be negative for CSF and/or blood. A 1-2 ml. volume of Isovue-250 was injected and flow of contrast was noted at each level. Radiographs were obtained for documentation purposes.   After attaining the desired  flow of contrast documented above, a 0.5 to 1.0 ml test dose of 0.25% Marcaine  was injected into each respective transforaminal space.  The patient was observed for 90 seconds post injection.  After no sensory deficits were reported, and normal lower extremity motor function was noted,   the above injectate was administered so that equal amounts of the injectate were placed at each foramen (level) into the transforaminal epidural space.   Additional Comments:  The patient tolerated the procedure well Dressing: 2 x 2 sterile gauze and Band-Aid    Post-procedure details: Patient was observed during the procedure. Post-procedure instructions were reviewed.  Patient left the clinic in stable condition.

## 2024-11-06 NOTE — Progress Notes (Signed)
 Pain Scale   Average Pain 5 Patient advising he as chronic lower back pain that increase when walking and standing, pain decreasing when sitting.        +Driver, -BT, -Dye Allergies.

## 2024-11-13 ENCOUNTER — Ambulatory Visit: Admitting: Orthopedic Surgery

## 2024-11-20 ENCOUNTER — Encounter: Payer: Self-pay | Admitting: Orthopedic Surgery

## 2024-11-20 ENCOUNTER — Ambulatory Visit: Admitting: Orthopedic Surgery

## 2024-11-20 DIAGNOSIS — M170 Bilateral primary osteoarthritis of knee: Secondary | ICD-10-CM | POA: Diagnosis not present

## 2024-11-20 MED ORDER — LIDOCAINE HCL 1 % IJ SOLN
5.0000 mL | INTRAMUSCULAR | Status: AC | PRN
  Administered 2024-11-20: 5 mL

## 2024-11-20 MED ORDER — METHYLPREDNISOLONE ACETATE 40 MG/ML IJ SUSP
40.0000 mg | INTRAMUSCULAR | Status: AC | PRN
  Administered 2024-11-20: 40 mg via INTRA_ARTICULAR

## 2024-11-20 NOTE — Progress Notes (Signed)
 Office Visit Note   Patient: Rick Melendez           Date of Birth: 24-Jan-1936           MRN: [id number] Visit Date: 11/20/2024              Requested by: Clarice Nottingham, MD [address] [place],  [place] [address] PCP: Clarice Nottingham, MD  Chief Complaint  Patient presents with   Right Knee - Follow-up   Left Knee - Follow-up      HPI: Discussed the use of AI scribe software for clinical note transcription with the patient, who gave verbal consent to proceed.  History of Present Illness Rick Melendez is an 88 year old male who presents for knee injections.  He is three months out from his previous knee injections and is here for follow-up injections. The last injections provided some relief, although the cold weather exacerbates his knee pain, prompting him to stay indoors.  He recently underwent carpal tunnel surgery, during which he was asleep and unaware of the procedures performed. He notes that he bruises easily but does not take blood thinners, aspirin , or omega-3 fish oil.     Assessment & Plan: Visit Diagnoses:  1. Bilateral primary osteoarthritis of knee     Plan: Assessment and Plan Assessment & Plan Bilateral primary knee osteoarthritis Chronic bilateral knee pain from osteoarthritis with varus alignment. Previous injection three months ago. Cold weather exacerbates symptoms. No cellulitis or effusion. - Administered intra-articular injections in both knees from anterior medial portal. - Follow up as needed.      Follow-Up Instructions: Return if symptoms worsen or fail to improve.   Ortho Exam  Patient is alert, oriented, no adenopathy, well-dressed, normal affect, normal respiratory effort. Physical Exam MUSCULOSKELETAL: Varus alignment in both knees. No effusion or cellulitis in either knee. Uses cane in right hand.      Imaging: No results found. No images are attached to the encounter.  Labs: Lab Results  Component Value  Date   HGBA1C 6.9 (H) 11/01/2023   ESRSEDRATE 6 02/10/2024   CRP <3.0 02/10/2024   LABURIC 5.2 02/10/2024   REPTSTATUS 03/24/2017 FINAL 03/19/2017   CULT NO GROWTH 5 DAYS 03/19/2017     Lab Results  Component Value Date   ALBUMIN 3.3 (L) 11/01/2023    Lab Results  Component Value Date   MG 1.5 (L) 03/19/2017   No results found for: VD25OH  No results found for: PREALBUMIN    Latest Ref Rng & Units 02/10/2024    1:48 PM 12/02/2023   12:51 PM 11/03/2023    4:27 AM  CBC EXTENDED  WBC 3.8 - 10.8 Thousand/uL 10.8  10.2  11.5   RBC 4.20 - 5.80 Million/uL 5.55  5.73  5.87   Hemoglobin 13.2 - 17.1 g/dL 85.9  85.8  85.2   HCT 38.5 - 50.0 % 46.2  47.9  48.1   Platelets 140 - 400 Thousand/uL 230  315  231   NEUT# 1,500 - 7,800 cells/uL 6,491        There is no height or weight on file to calculate BMI.  Orders:  No orders of the defined types were placed in this encounter.  No orders of the defined types were placed in this encounter.    Procedures: Large Joint Inj: bilateral knee on 11/20/2024 11:07 AM Indications: pain and diagnostic evaluation Details: 22 G 1.5 in needle, anteromedial approach  Arthrogram: No  Medications (  Right): 5 mL lidocaine  1 %; 40 mg methylPREDNISolone  acetate 40 MG/ML Medications (Left): 5 mL lidocaine  1 %; 40 mg methylPREDNISolone  acetate 40 MG/ML Outcome: tolerated well, no immediate complications Procedure, treatment alternatives, risks and benefits explained, specific risks discussed. Consent was given by the patient. Immediately prior to procedure a time out was called to verify the correct patient, procedure, equipment, support staff and site/side marked as required. Patient was prepped and draped in the usual sterile fashion.      Clinical Data: No additional findings.  ROS:  All other systems negative, except as noted in the HPI. Review of Systems  Objective: Vital Signs: There were no vitals taken for this  visit.  Specialty Comments:  EXAM: MRI LUMBAR SPINE WITHOUT CONTRAST   TECHNIQUE: Multiplanar, multisequence MR imaging of the lumbar spine was performed. No intravenous contrast was administered.   COMPARISON:  Lumbar MRI 05/27/2014   FINDINGS: Segmentation:  Normal.  Lowest disc space L5-S1   Alignment: Mild anterolisthesis T12-L1. 4 mm retrolisthesis L1-2. Slight retrolisthesis L3-4. 6 mm anterolisthesis L4-5. Mild retrolisthesis L5-S1.   Vertebrae:  Normal bone marrow.  Negative for fracture or mass.   Conus medullaris and cauda equina: Conus extends to the L1-2 level. Conus and cauda equina appear normal.   Paraspinal and other soft tissues: Negative for mass or adenopathy. No soft tissue edema or fluid collection.   Disc levels:   T12-L1: Advanced disc degeneration left greater than right. Prominent left-sided spurring. Bilateral facet degeneration. Moderate spinal stenosis. Severe subarticular and foraminal stenosis on the left. Mild subarticular stenosis on the right. Mild progression of stenosis on the left since the prior study   L1-2: Advanced disc degeneration with diffuse endplate spurring and marked disc space narrowing. Mild facet degeneration. Mild to moderate spinal stenosis. Moderate subarticular stenosis bilaterally. No interval change   L2-3: Disc degeneration with diffuse disc bulging and endplate spurring. Mild facet degeneration. Mild subarticular stenosis bilaterally with mild progression   L3-4: Disc degeneration with diffuse disc bulging and spurring, right greater than left. Mild facet degeneration. Mild spinal stenosis. Moderate to severe right subarticular stenosis. Moderate left subarticular stenosis. No significant change   L4-5: 6 mm anterolisthesis with severe facet degeneration and diffuse bulging of the disc. Severe spinal stenosis and severe subarticular stenosis bilaterally have progressed since the prior study. In addition,  there is severe right foraminal encroachment with right L4 nerve root impingement and moderate left foraminal encroachment.   L5-S1: Left paracentral disc protrusion with compression of the left S1 nerve root similar to the prior study. Mild facet degeneration bilaterally.   IMPRESSION: Advanced multilevel degenerative change throughout the lumbar spine. There has been progression of degenerative changes stenosis at multiple levels as described above   6 mm anterolisthesis L4-5. Severe spinal and severe subarticular stenosis bilaterally have progressed in the interval. Severe right foraminal encroachment.     Electronically Signed   By: Carlin Gaskins M.D.   On: 08/22/2020 21:23  PMFS History: Patient Active Problem List   Diagnosis Date Noted   Persistent atrial fibrillation (HCC) 12/02/2023   Hypercoagulable state due to persistent atrial fibrillation (HCC) 12/02/2023   Acute stroke due to ischemia (HCC) 11/02/2023   DOE (dyspnea on exertion) 08/11/2023   Upper airway cough syndrome 08/11/2023   Slow heart rate 04/14/2022   Influenza B    AKI (acute kidney injury)    CAP (community acquired pneumonia) 03/19/2017   Bilateral primary osteoarthritis of knee 01/28/2017   Impingement syndrome of  right shoulder 01/28/2017   CAD - CABG X 2 9/07 LIMA-LAD, SVG-OM. Low risk Myoview 7/12 09/07/2013   Essential hypertension 09/07/2013   Dyslipidemia 09/07/2013   Diabetes mellitus (HCC) 09/07/2013   Chronic renal insufficiency, stage III (moderate)- SCr 1.6 (Aug 2013) 09/07/2013   Sleep apnea- compliant with C-pap 09/07/2013   PVD (peripheral vascular disease)- Rt iliac disease by doppler 7/13 09/07/2013   Past Medical History:  Diagnosis Date   Arthritis    Coronary artery disease 2007   cabg x3   Diabetes mellitus without complication (HCC)    Hyperlipidemia    Hypertension    Influenza B 03/19/2017   OSA on CPAP    Dr BURNARD - follows and treatmentof sleep apnea   PVD  (peripheral vascular disease) 2003   DR BERRY-  -LEFT LEG STENTING    Family History  Problem Relation Age of Onset   Cancer Father    Cancer Brother     Past Surgical History:  Procedure Laterality Date   CARDIAC CATHETERIZATION  0911/2007   3 vessel CAD WITH LEFT MAIN CAD SEVERE ,norm lLIMA,LV nom. ,evidence for very mild aortic stenosis with gradient, mild stenosis distal abdnminal aotra and proximal left common iliac artery 30%   CARDIOVERSION N/A 12/06/2023   Procedure: CARDIOVERSION;  Surgeon: Pietro Redell RAMAN, MD;  Location: [facility];  Service: Cardiovascular;  Laterality: N/A;   CORONARY ARTERY BYPASS GRAFT  08/31/2006   DR DIANNIA to LAD, VEIN TO AN OBTUSE MARGINAL BRANCH AND  RIGHT CORONARY ARTERY   CPET  04/13/2012   normal   DOPPLER ECHOCARDIOGRAPHY  07/08/2011   EF =>55%   lower arterial doppler  07/01/2012   mildly abnormal lower extremity;ABIs >1 bilaterally with moderately high-grade right iliac stenosis that had not changed from proir study.   NM MYOCAR PERF WALL MOTION  7/182012   EF 59%, normal myocardial perfusio   Social History   Occupational History   Not on file  Tobacco Use   Smoking status: Former    Current packs/day: 0.00    Types: Cigarettes    Quit date: 09/03/1981    Years since quitting: 43.2   Smokeless tobacco: Current    Types: Chew  Substance and Sexual Activity   Alcohol use: Yes   Drug use: No   Sexual activity: Not on file

## 2024-12-18 ENCOUNTER — Ambulatory Visit: Admitting: Podiatry

## 2024-12-18 DIAGNOSIS — M79675 Pain in left toe(s): Secondary | ICD-10-CM | POA: Diagnosis not present

## 2024-12-18 DIAGNOSIS — Z7901 Long term (current) use of anticoagulants: Secondary | ICD-10-CM | POA: Diagnosis not present

## 2024-12-18 DIAGNOSIS — M79674 Pain in right toe(s): Secondary | ICD-10-CM

## 2024-12-18 DIAGNOSIS — B351 Tinea unguium: Secondary | ICD-10-CM

## 2024-12-18 NOTE — Progress Notes (Signed)
 Subjective: Chief Complaint  Patient presents with   Diabetes    Non insulin  dependent diabetic patient presents today for a [facility] no changes since LMV    88 year old male presents the office today for concerns of thick, elongated nails not able to trim himself.  No open lesions.  No injuries  He is on Eliquis    Objective: AAO x3, NAD DP/PT pulses palpable bilaterally, CRT less than 3 seconds Nails are hypertrophic, dystrophic, brittle, discolored, elongated 10. No surrounding redness or drainage. Tenderness nails 1-5 bilaterally.  No signfiant callus formation today.  Dry skin present bilaterally.  No pain with calf compression, swelling, warmth, erythema  Assessment: Symptomatic onychomycosis, on Eliquis    Plan: -All treatment options discussed with the patient including all alternatives, risks, complications.  -Toenails sharply debrided x 10 without any complications or bleeding. -Discussed moisturizers first week. -Patient encouraged to call the office with any questions, concerns, change in symptoms.   Return in about 3 months (around 03/18/2025) for nail trim .  Rick Melendez DPM

## 2024-12-26 ENCOUNTER — Encounter: Payer: Self-pay | Admitting: Cardiovascular Disease

## 2024-12-26 ENCOUNTER — Ambulatory Visit: Attending: Cardiovascular Disease | Admitting: Cardiovascular Disease

## 2024-12-26 VITALS — BP 124/54 | HR 60 | Ht 66.0 in | Wt 204.2 lb

## 2024-12-26 DIAGNOSIS — I2510 Atherosclerotic heart disease of native coronary artery without angina pectoris: Secondary | ICD-10-CM

## 2024-12-26 DIAGNOSIS — I4819 Other persistent atrial fibrillation: Secondary | ICD-10-CM | POA: Diagnosis not present

## 2024-12-26 DIAGNOSIS — I10 Essential (primary) hypertension: Secondary | ICD-10-CM | POA: Diagnosis not present

## 2024-12-26 DIAGNOSIS — G4733 Obstructive sleep apnea (adult) (pediatric): Secondary | ICD-10-CM | POA: Diagnosis not present

## 2024-12-26 DIAGNOSIS — E785 Hyperlipidemia, unspecified: Secondary | ICD-10-CM

## 2024-12-26 DIAGNOSIS — I739 Peripheral vascular disease, unspecified: Secondary | ICD-10-CM | POA: Diagnosis not present

## 2024-12-26 NOTE — Assessment & Plan Note (Signed)
 History of PAF status post successful outpatient DC cardioversion by Dr. Pietro 12/06/2023 maintaining sinus rhythm on Eliquis  oral anticoagulation.

## 2024-12-26 NOTE — Assessment & Plan Note (Signed)
 History of PAD status post left iliac stenting back in 2003.  His most recent Doppler studies performed 07/05/2023 did show moderate right iliac disease with a patent iliac right left iliac stent.  He denies claudication.  At this point, given his age I did not feel compelled to continue to follow this noninvasively.

## 2024-12-26 NOTE — Assessment & Plan Note (Signed)
 History of essential hypertension blood pressure measured today 124/54.  He is on losartan and metoprolol .

## 2024-12-26 NOTE — Progress Notes (Signed)
 "     12/26/2024 Rick Melendez   June 14, 1936  [id number]  Primary Physician Clarice Nottingham, MD Primary Cardiologist: Dorn JINNY Lesches MD FACP, [place], [place], [place]  HPI:  Rick Melendez is a 89 y.o.   severely obese, married Caucasian male, father of 2, grandfather to 2 grandchildren who I last saw in the office 06/21/2024.  He is accompanied by his wife Rick Melendez today.[name] He is retired from doing furniture conservator/restorer. 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 He doesn't do any regular exercise but he active around the farm with chores. He has a remote history tobacco abuse, having quit 20 years ago, as well as, treated hypertension and hyperlipidemia, and diabetes.    He has CAD and had left main 2-vessel disease by cath August 31, 2006, and underwent coronary artery bypass grafting x2 by Dr. Katherene Jude with a LIMA to his LAD, a vein to an obtuse marginal branch and right coronary arter. He has also had stenting of his left leg back in 2003, doppler in July 2013 showed Rt iliac disease but he has been asymptomatic. He denies chest pain or shortness of breath. He does have daytime fatigue and this is unchanged. He has had some leg pain which he attributes to statin Rx. His last Myoview was performed 07/06/13 was nonischemic.[name] He sees Dr. Burnard f for obstructive sleep apnea.  He is on CPAP supposedly but unfortunately is not wearing this.     He did unfortunately have a stroke 11/02/2023.  A 2D echo with bubble study did not show an intra-atrial shunt, carotid Dopplers were normal.  He did have atrial fibrillation and underwent successful cardioversion by Dr. Pietro 12/06/2023 maintaining sinus rhythm on Eliquis .  He does have some balance issues since the stroke and walks with a cane.  Since I saw him 6 months ago he remained stable.  He continues to deny chest pain or shortness of breath.  He does complain of some knee pain when he walks but really denies claudication.  He has  maintained sinus rhythm.   Active Medications[1]   Allergies[2]  Social History   Socioeconomic History   Marital status: Married    Spouse name: Not on file   Number of children: Not on file   Years of education: Not on file   Highest education level: Not on file  Occupational History   Not on file  Tobacco Use   Smoking status: Former    Current packs/day: 0.00    Types: Cigarettes    Quit date: 09/03/1981    Years since quitting: 43.3   Smokeless tobacco: Current    Types: Chew  Substance and Sexual Activity   Alcohol use: Yes   Drug use: No   Sexual activity: Not on file  Other Topics Concern   Not on file  Social History Narrative   Right handed   Drinks coffe   One floor home   Lives with wife   Retired   Social Drivers of Health   Tobacco Use: High Risk (12/26/2024)   Patient History    Smoking Tobacco Use: Former    Smokeless Tobacco Use: Current    Passive Exposure: Not on Actuary Strain: Not on file  Food Insecurity: No Food Insecurity (11/02/2023)   Hunger Vital Sign    Worried About Running Out of Food in the Last Year: Never true    Ran Out of Food in the  Last Year: Never true  Transportation Needs: No Transportation Needs (11/02/2023)   PRAPARE - Administrator, Civil Service (Medical): No    Lack of Transportation (Non-Medical): No  Physical Activity: Not on file  Stress: Not on file  Social Connections: Unknown (09/16/2022)   [facility]   Social Network    Social Network: Not on file  Intimate Partner Violence: Not At Risk (11/02/2023)   Humiliation, Afraid, Rape, and Kick questionnaire    Fear of Current or Ex-Partner: No    Emotionally Abused: No    Physically Abused: No    Sexually Abused: No  Depression (PHQ2-9): Not on file  Alcohol Screen: Not on file  Housing: Low Risk (11/02/2023)   Housing    Last Housing Risk Score: 0  Utilities: Not At Risk (11/02/2023)   AHC Utilities     Threatened with loss of utilities: No  Health Literacy: Not on file     Review of Systems: General: negative for chills, fever, night sweats or weight changes.  Cardiovascular: negative for chest pain, dyspnea on exertion, edema, orthopnea, palpitations, paroxysmal nocturnal dyspnea or shortness of breath Dermatological: negative for rash Respiratory: negative for cough or wheezing Urologic: negative for hematuria Abdominal: negative for nausea, vomiting, diarrhea, bright red blood per rectum, melena, or hematemesis Neurologic: negative for visual changes, syncope, or dizziness All other systems reviewed and are otherwise negative except as noted above.    Blood pressure (!) 124/54, pulse 60, height 5' 6 (1.676 m), weight 204 lb 3.2 oz (92.6 kg), SpO2 93%.  General appearance: alert and no distress Neck: no adenopathy, no carotid bruit, no JVD, supple, symmetrical, trachea midline, and thyroid  not enlarged, symmetric, no tenderness/mass/nodules Lungs: clear to auscultation bilaterally Heart: regular rate and rhythm, S1, S2 normal, no murmur, click, rub or gallop Extremities: extremities normal, atraumatic, no cyanosis or edema Pulses: 2+ and symmetric Skin: Skin color, texture, turgor normal. No rashes or lesions Neurologic: Grossly normal  EKG EKG Interpretation Date/Time:  Tuesday December 26 2024 10:13:59 EST Ventricular Rate:  60 PR Interval:  242 QRS Duration:  114 QT Interval:  430 QTC Calculation: 430 R Axis:   -17  Text Interpretation: Sinus rhythm with 1st degree A-V block with occasional Premature ventricular complexes Incomplete left bundle branch block Nonspecific T wave abnormality When compared with ECG of 23-Dec-2023 11:25, Premature ventricular complexes are now Present T wave inversion now evident in Lateral leads Confirmed by Court Carrier [phone]) on 12/26/2024 10:49:34 AM    ASSESSMENT AND PLAN:   CAD - CABG X 2 9/07 LIMA-LAD, SVG-OM. Low risk Myoview  7/12 History of CAD status post CABG 08/31/2006 after catheter left main/two-vessel disease.  Dr. Army did CABG x 2 with a LIMA to his LAD and a vein to an obtuse marginal branch and right coronary artery.  He is completely asymptomatic.  Dyslipidemia History of dyslipidemia on high-dose statin therapy with lipid profile performed 09/07/2024 revealing total cholesterol 94, LDL 32 and HDL 40.  Sleep apnea- compliant with C-pap History of obstructive sleep apnea on CPAP  PVD (peripheral vascular disease)- Rt iliac disease by doppler 7/13 History of PAD status post left iliac stenting back in 2003.  His most recent Doppler studies performed 07/05/2023 did show moderate right iliac disease with a patent iliac right left iliac stent.  He denies claudication.  At this point, given his age I did not feel compelled to continue to follow this noninvasively.  Essential hypertension History of  essential hypertension blood pressure measured today 124/54.  He is on losartan and metoprolol .  Persistent atrial fibrillation (HCC) History of PAF status post successful outpatient DC cardioversion by Dr. Pietro 12/06/2023 maintaining sinus rhythm on Eliquis  oral anticoagulation.     Dorn DOROTHA Lesches MD FACP,FACC,FAHA, FSCAI 12/26/2024 10:58 AM    [1]  Current Meds  Medication Sig   albuterol  (PROVENTIL  HFA) 108 (90 Base) MCG/ACT inhaler Inhale 2 puffs into the lungs every 4 (four) hours as needed for wheezing or shortness of breath.   apixaban  (ELIQUIS ) 5 MG TABS tablet Take 1 tablet (5 mg total) by mouth 2 (two) times daily.   clotrimazole -betamethasone  (LOTRISONE ) cream Apply 1 Application topically daily.   Cyanocobalamin (B-12 PO) Take 1,000 mcg by mouth daily.   ezetimibe  (ZETIA ) 10 MG tablet Take 10 mg by mouth daily.   famotidine (PEPCID) 20 MG tablet Take 20 mg by mouth 2 (two) times daily.   FARXIGA 10 MG TABS tablet Take 10 mg by mouth daily.   furosemide (LASIX) 20 MG tablet Take 20 mg  by mouth daily.   ipratropium (ATROVENT) 0.06 % nasal spray Place 1 spray into the nose 3 (three) times daily as needed for rhinitis.   KERENDIA 10 MG TABS Take 10 mg by mouth daily.   losartan (COZAAR) 25 MG tablet Take 25 mg by mouth daily.   meclizine (ANTIVERT) 25 MG tablet Take 25 mg by mouth 3 (three) times daily as needed for dizziness.   memantine  (NAMENDA ) 5 MG tablet Take 1 tablet  twice a day   metoprolol  tartrate (LOPRESSOR ) 25 MG tablet TAKE 1/2 TABLET(12.5 MG) BY MOUTH TWICE DAILY   OVER THE COUNTER MEDICATION USES CPAP NIGHTLY   rosuvastatin  (CRESTOR ) 40 MG tablet Take 40 mg by mouth daily.   traMADol (ULTRAM) 50 MG tablet Take 50-100 mg by mouth See admin instructions. Take 50mg  (1 tablet) by mouth two to three times a day.  [2]  Allergies Allergen Reactions   Ozempic (0.25 Or 0.5 Mg-Dose) [Semaglutide(0.25 Or 0.5mg -Dos)] Other (See Comments)    Hallucinations, lethargic    "

## 2024-12-26 NOTE — Assessment & Plan Note (Signed)
 History of CAD status post CABG 08/31/2006 after catheter left main/two-vessel disease.  Dr. Army did CABG x 2 with a LIMA to his LAD and a vein to an obtuse marginal branch and right coronary artery.  He is completely asymptomatic.

## 2024-12-26 NOTE — Patient Instructions (Signed)

## 2024-12-26 NOTE — Assessment & Plan Note (Signed)
 History of obstructive sleep apnea on CPAP.

## 2024-12-26 NOTE — Assessment & Plan Note (Signed)
 History of dyslipidemia on high-dose statin therapy with lipid profile performed 09/07/2024 revealing total cholesterol 94, LDL 32 and HDL 40.

## 2025-01-08 NOTE — Progress Notes (Signed)
 "   Mild Cognitive Impairment    Rick Melendez is a very pleasant 89 y.o. RH male with a history of  hypertension, hyperlipidemia, DM2, osteoarthritis with multiple surgeries, mild anemia, hard of hearing, OSA not on CPAP after weight loss, CAD, atrial fibrillation status post cardioversion, on Eliquis , hypoproteinemia, small right paramedian pons acute infarct November 2024  presenting today in follow-up for evaluation of memory loss. Patient is on memantine  5 mg twice daily, tolerating well.. Patient was last seen on 07/05/2024.  Memory is stable with MMSE today 29/30. Patient is able to participate on ADLs and to drive without difficulties. Mood is good. This patient is accompanied in the office by his wife who supplements the history. Previous records as well as any outside records available were reviewed prior to todays visit   Follow up in 6 months Continue memantine  5 mg twice daily, side effects discussed  Continue using the hearing aids in an effort to improve comprehension  Secondary stroke prevention, with goal BP less than 140/90, LDL 70 and A1c less than 6  Follow-up OSA with pertinent specialists, use the CPAP Continue to control mood as per PCP Recommend good control of cardiovascular risk factors, follow with cardiology      Discussed the use of AI scribe software for clinical note transcription with the patient, who gave verbal consent to proceed.  History of Present Illness Rick Melendez is an 89 year old male who presents for a follow-up for memory loss  There have been no significant changes in his memory since the last visit in July. He continues to experience issues with short-term memory, particularly with recent conversations and new information, while his long-term memory remains intact. He is currently taking memantine  5 mg twice a day. No mood or personality changes, hallucinations, or seizures.  He has a CPAP machine for sleep apnea but does not use it regularly.  He wakes up two to three times a night to urinate, which disrupts his sleep, and takes naps during the day, which may affect his nighttime sleep quality. He experiences occasional vivid dreams.  His appetite is reportedly good, with a recent weight gain of four pounds during the holidays, though he has since lost two pounds. He drinks [facility] regularly, consuming about two to three cans a day. No swallowing difficulties or choking.  He has a history of chronic back pain due to lumbar spine stenosis and receives back injections. He uses a cane for mobility when outside but does not use it indoors. No recent falls or head injuries.  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  He continues to drive without any reported accidents or distractions.   Initial visit 10/02/2022 How long did patient have memory difficulties? I didn't notice anything.  Wife is not sure that he has significant memory changes, the doctor sent me here.  Disoriented when walking into a room?  Patient denies   Leaving objects in unusual places?  Patient denies   Patient lives wife Ambulates  with difficulty? Uses a walker and a cane for stability due to arthritis  Recent falls?  Fell at the end of July and uses a walker. ONnPT/OT weekly  Any head injuries?  Patient denies   History of seizures?   Patient denies   Wandering behavior?  Patient denies   Patient drives? No issues driving  Any mood changes ?  Denies  Any depression?:  Patient denies  I like me too good! Hallucinations?  Patient denies now, but when I was on Ozempic he did, once off I was fine. Paranoia?  Patient denies   Patient reports that sleeps well without vivid dreams, REM behavior or sleepwalking    History of sleep apnea? Endorsed, but after 40 lb weight loss I no longer need CPAP Any hygiene concerns?  Patient denies   Independent of bathing and dressing?  Endorsed   Does the patient needs help with medications? He is in charge  Who is in charge of the finances? Wife has always been is in charge   Any changes in appetite?  Patient denies   Patient have trouble swallowing? Patient denies   Does the patient cook?  Patient denies   Any kitchen accidents such as leaving the stove on? Patient denies   Any headaches?  Patient denies   Double vision? Patient denies   Any focal numbness or tingling?  Patient denies   Chronic back pain: Endorsed, s/p falls and several surgeries shoulder, C spine Unilateral weakness?  Patient denies   Any tremors?  Patient denies   Any history of anosmia?  Patient denies   Any incontinence of urine?  Patient denies   Any bowel dysfunction?   Patient denies   History of heavy alcohol intake?  Patient denies   History of heavy tobacco use?  Patient denies   Family history of dementia?   Denies     Retired plumber   MRI brain report 09/22/22 There is mild diffuse cerebral atrophy. No hippocampal atrophy.     Labs 10/02/2022 TSH 1.93 MRI brain personally reviewed 11/02/23 small acute R paramedian pons infarct, mild diffuse cerebral atrophy       Past Medical History:  Diagnosis Date   Arthritis    Coronary artery disease 2007   cabg x3   Diabetes mellitus without complication (HCC)    Hyperlipidemia    Hypertension    Influenza B 03/19/2017   OSA on CPAP    Dr BURNARD - follows and treatmentof sleep apnea   PVD (peripheral vascular disease) 2003   DR BERRY-  -LEFT LEG STENTING     Past Surgical History:  Procedure Laterality Date   CARDIAC CATHETERIZATION  0911/2007   3 vessel CAD WITH LEFT MAIN CAD SEVERE ,norm lLIMA,LV nom. ,evidence for very mild aortic stenosis with gradient, mild stenosis distal abdnminal aotra and proximal left common iliac artery 30%   CARDIOVERSION N/A 12/06/2023   Procedure: CARDIOVERSION;  Surgeon: Pietro Redell RAMAN, MD;  Location: [facility];  Service: Cardiovascular;   Laterality: N/A;   CORONARY ARTERY BYPASS GRAFT  08/31/2006   DR DIANNIA to LAD, VEIN TO AN OBTUSE MARGINAL BRANCH AND  RIGHT CORONARY ARTERY   CPET  04/13/2012   normal   DOPPLER ECHOCARDIOGRAPHY  07/08/2011   EF =>55%   lower arterial doppler  07/01/2012   mildly abnormal lower extremity;ABIs >1 bilaterally with moderately high-grade right iliac stenosis that had not changed from proir study.   NM MYOCAR PERF WALL MOTION  7/182012   EF 59%, normal myocardial perfusio         Objective:     PHYSICAL EXAMINATION:    VITALS:   Vitals:   01/09/25 0919  BP: 111/69  Pulse: 67  Resp: 20  SpO2: 98%  Weight: 202 lb (91.6 kg)  Height: 5' 6 (1.676 m)    GEN:  The patient appears stated age  and is in NAD. HEENT:  Normocephalic, atraumatic.   Neurological examination:  General: NAD, well-groomed, appears stated age. Orientation: The patient is alert. Oriented to person, place and not to date.  Cranial nerves: There is good facial symmetry.The speech is fluent and clear. No aphasia or dysarthria. Fund of knowledge is appropriate. Recent memory impaired and remote memory is normal.  Attention and concentration are normal.  Able to name objects and repeat phrases.  Hearing is decreased to conversational tone with hearing aids.   Delayed recall 3/3 Sensation: Sensation is intact to light touch throughout Motor: Strength is at least antigravity x4. DTR's 2/4 in UE/LE      10/02/2022   10:00 AM  Montreal Cognitive Assessment   Visuospatial/ Executive (0/5) 1  Naming (0/3) 3  Attention: Read list of digits (0/2) 2  Attention: Read list of letters (0/1) 1  Attention: Serial 7 subtraction starting at 100 (0/3) 3  Language: Repeat phrase (0/2) 1  Language : Fluency (0/1) 0  Abstraction (0/2) 0  Delayed Recall (0/5) 4  Orientation (0/6) 5  Total 20  Adjusted Score (based on education) 20       01/09/2025   12:00 PM 07/05/2024    4:00 PM 11/12/2023   12:00 PM  MMSE -  Mini Mental State Exam  Orientation to time 4 4 5   Orientation to Place 5 5 5   Registration 3 3 3   Attention/ Calculation 5 5 5   Recall 3 3 2   Language- name 2 objects 2 2 2   Language- repeat 1 1 1   Language- follow 3 step command 3 3 3   Language- read & follow direction 1 1 1   Write a sentence 1 1 1   Copy design 1 1 1   Total score 29 29 29       Movement examination: Tone: There is normal tone in the UE/LE Abnormal movements:  no tremor.  No myoclonus.  No asterixis.   Coordination:  There is no decremation with RAM's. Normal finger to nose  Gait and Station: The patient has no difficulty arising out of a deep-seated chair without the use of the hands. The patient's stride length is good.  Gait is cautious and narrow.   Thank you for allowing us  the opportunity to participate in the care of this nice patient. Please do not hesitate to contact us  for any questions or concerns.   Total time spent on today's visit was 21 minutes dedicated to this patient today, preparing to see patient, examining the patient, ordering tests and/or medications and counseling the patient, documenting clinical information in the EHR or other health record, independently interpreting results and communicating results to the patient/family, discussing treatment and goals, answering patient's questions and coordinating care.  Cc:  Clarice Nottingham, MD  Camie Sevin 01/09/2025 12:17 PM      "

## 2025-01-09 ENCOUNTER — Encounter: Payer: Self-pay | Admitting: Physician Assistant

## 2025-01-09 ENCOUNTER — Ambulatory Visit: Admitting: Physician Assistant

## 2025-01-09 VITALS — BP 111/69 | HR 67 | Resp 20 | Ht 66.0 in | Wt 202.0 lb

## 2025-01-09 DIAGNOSIS — G3184 Mild cognitive impairment, so stated: Secondary | ICD-10-CM | POA: Diagnosis not present

## 2025-01-09 MED ORDER — MEMANTINE HCL 5 MG PO TABS: ORAL_TABLET | ORAL | 3 refills | Status: AC

## 2025-01-09 NOTE — Patient Instructions (Signed)
 It was a pleasure to see you today at our office.   Recommendations:  Continue  Memantine   5 mg  twice a day   Follow up in 6 months  Use the hearing aids  Use the CPAP!!! Continue Eliquis  as prescribed. Follow with Cardiology       If you have any severe symptoms of a stroke, or other severe issues such as confusion,severe chills or fever, etc call 911 or go to the ER as you may need to be evaluated further      RECOMMENDATIONS FOR ALL PATIENTS WITH MEMORY PROBLEMS: 1. Continue to exercise (Recommend 30 minutes of walking everyday, or 3 hours every week) 2. Increase social interactions - continue going to [place] and enjoy social gatherings with friends and family 3. Eat healthy, avoid fried foods and eat more fruits and vegetables 4. Maintain adequate blood pressure, blood sugar, and blood cholesterol level. Reducing the risk of stroke and cardiovascular disease also helps promoting better memory. 5. Avoid stressful situations. Live a simple life and avoid aggravations. Organize your time and prepare for the next day in anticipation. 6. Sleep well, avoid any interruptions of sleep and avoid any distractions in the bedroom that may interfere with adequate sleep quality 7. Avoid sugar, avoid sweets as there is a strong link between excessive sugar intake, diabetes, and cognitive impairment We discussed the Mediterranean diet, which has been shown to help patients reduce the risk of progressive memory disorders and reduces cardiovascular risk. This includes eating fish, eat fruits and green leafy vegetables, nuts like almonds and hazelnuts, walnuts, and also use olive oil. Avoid fast foods and fried foods as much as possible. Avoid sweets and sugar as sugar use has been linked to worsening of memory function.  There is always a concern of gradual progression of memory problems. If this is the case, then we may need to adjust level of care according to patient needs. Support, both to the  patient and caregiver, should then be put into place.     FALL PRECAUTIONS: Be cautious when walking. Scan the area for obstacles that may increase the risk of trips and falls. When getting up in the mornings, sit up at the edge of the bed for a few minutes before getting out of bed. Consider elevating the bed at the head end to avoid drop of blood pressure when getting up. Walk always in a well-lit room (use night lights in the walls). Avoid area rugs or power cords from appliances in the middle of the walkways. Use a walker or a cane if necessary and consider physical therapy for balance exercise. Get your eyesight checked regularly.  FINANCIAL OVERSIGHT: Supervision, especially oversight when making financial decisions or transactions is also recommended.  HOME SAFETY: Consider the safety of the kitchen when operating appliances like stoves, microwave oven, and blender. Consider having supervision and share cooking responsibilities until no longer able to participate in those. Accidents with firearms and other hazards in the house should be identified and addressed as well.   ABILITY TO BE LEFT ALONE: If patient is unable to contact 911 operator, consider using LifeLine, or when the need is there, arrange for someone to stay with patients. Smoking is a fire hazard, consider supervision or cessation. Risk of wandering should be assessed by caregiver and if detected at any point, supervision and safe proof recommendations should be instituted.  MEDICATION SUPERVISION: Inability to self-administer medication needs to be constantly addressed. Implement a mechanism to ensure safe administration  of the medications.   DRIVING: Regarding driving, in patients with progressive memory problems, driving will be impaired. We advise to have someone else do the driving if trouble finding directions or if minor accidents are reported. Independent driving assessment is available to determine safety of  driving.   If you are interested in the driving assessment, you can contact the following:  [facility] [employer] [facility] in [place] [phone]  [facility] [phone]  [facility] [phone] or [phone]    Mediterranean Diet A Mediterranean diet refers to food and lifestyle choices that are based on the traditions of countries located on the [employer]. This way of eating has been shown to help prevent certain conditions and improve outcomes for people who have chronic diseases, like kidney disease and heart disease. What are tips for following this plan? Lifestyle  Cook and eat meals together with your family, when possible. Drink enough fluid to keep your urine clear or pale yellow. Be physically active every day. This includes: Aerobic exercise like running or swimming. Leisure activities like gardening, walking, or housework. Get 7-8 hours of sleep each night. If recommended by your health care provider, drink red wine in moderation. This means 1 glass a day for nonpregnant women and 2 glasses a day for men. A glass of wine equals 5 oz (150 mL). Reading food labels  Check the serving size of packaged foods. For foods such as rice and pasta, the serving size refers to the amount of cooked product, not dry. Check the total fat in packaged foods. Avoid foods that have saturated fat or trans fats. Check the ingredients list for added sugars, such as corn syrup. Shopping  At the grocery store, buy most of your food from the areas near the walls of the store. This includes: Fresh fruits and vegetables (produce). Grains, beans, nuts, and seeds. Some of these may be available in unpackaged forms or large amounts (in bulk). Fresh seafood. Poultry and eggs. Low-fat dairy products. Buy whole ingredients instead of prepackaged foods. Buy fresh fruits and vegetables in-season from local farmers markets. Buy  frozen fruits and vegetables in resealable bags. If you do not have access to quality fresh seafood, buy precooked frozen shrimp or canned fish, such as tuna, salmon, or sardines. Buy small amounts of raw or cooked vegetables, salads, or olives from the deli or salad bar at your store. Stock your pantry so you always have certain foods on hand, such as olive oil, canned tuna, canned tomatoes, rice, pasta, and beans. Cooking  Cook foods with extra-virgin olive oil instead of using butter or other vegetable oils. Have meat as a side dish, and have vegetables or grains as your main dish. This means having meat in small portions or adding small amounts of meat to foods like pasta or stew. Use beans or vegetables instead of meat in common dishes like chili or lasagna. Experiment with different cooking methods. Try roasting or broiling vegetables instead of steaming or sauteing them. Add frozen vegetables to soups, stews, pasta, or rice. Add nuts or seeds for added healthy fat at each meal. You can add these to yogurt, salads, or vegetable dishes. Marinate fish or vegetables using olive oil, lemon juice, garlic, and fresh herbs. Meal planning  Plan to eat 1 vegetarian meal one day each week. Try to work up to 2 vegetarian meals, if possible. Eat seafood 2 or more times a week. Have healthy snacks readily available, such as: Vegetable  sticks with hummus. Greek yogurt. Fruit and nut trail mix. Eat balanced meals throughout the week. This includes: Fruit: 2-3 servings a day Vegetables: 4-5 servings a day Low-fat dairy: 2 servings a day Fish, poultry, or lean meat: 1 serving a day Beans and legumes: 2 or more servings a week Nuts and seeds: 1-2 servings a day Whole grains: 6-8 servings a day Extra-virgin olive oil: 3-4 servings a day Limit red meat and sweets to only a few servings a month What are my food choices? Mediterranean diet Recommended Grains: Whole-grain pasta. Brown rice. Bulgar  wheat. Polenta. Couscous. Whole-wheat bread. [place] Madeira. Vegetables: Artichokes. Beets. Broccoli. Cabbage. Carrots. Eggplant. Green beans. Chard. Kale. Spinach. Onions. Leeks. Peas. Squash. Tomatoes. Peppers. Radishes. Fruits: Apples. Apricots. Avocado. Berries. Bananas. Cherries. Dates. Figs. Grapes. Lemons. Melon. Oranges. Peaches. Plums. Pomegranate. Meats and other protein foods: Beans. Almonds. Sunflower seeds. Pine nuts. Peanuts. Cod. Salmon. Scallops. Shrimp. Tuna. Tilapia. Clams. Oysters. Eggs. Dairy: Low-fat milk. Cheese. Greek yogurt. Beverages: Water. Red wine. Herbal tea. Fats and oils: Extra virgin olive oil. Avocado oil. Grape seed oil. Sweets and desserts: Greek yogurt with honey. Baked apples. Poached pears. Trail mix. Seasoning and other foods: Basil. Cilantro. Coriander. Cumin. Mint. Parsley. Sage. Rosemary. Tarragon. Garlic. Oregano. Thyme. Pepper. Balsalmic vinegar. Tahini. Hummus. Tomato sauce. Olives. Mushrooms. Limit these Grains: Prepackaged pasta or rice dishes. Prepackaged cereal with added sugar. Vegetables: Deep fried potatoes (french fries). Fruits: Fruit canned in syrup. Meats and other protein foods: Beef. Pork. Lamb. Poultry with skin. Hot dogs. [place]. Dairy: Ice cream. Sour cream. Whole milk. Beverages: Juice. Sugar-sweetened soft drinks. Beer. Liquor and spirits. Fats and oils: Butter. Canola oil. Vegetable oil. Beef fat (tallow). Lard. Sweets and desserts: Cookies. Cakes. Pies. Candy. Seasoning and other foods: Mayonnaise. Premade sauces and marinades. The items listed may not be a complete list. Talk with your dietitian about what dietary choices are right for you. Summary The Mediterranean diet includes both food and lifestyle choices. Eat a variety of fresh fruits and vegetables, beans, nuts, seeds, and whole grains. Limit the amount of red meat and sweets that you eat. Talk with your health care provider about whether it is safe for you to drink red  wine in moderation. This means 1 glass a day for nonpregnant women and 2 glasses a day for men. A glass of wine equals 5 oz (150 mL). This information is not intended to replace advice given to you by your health care provider. Make sure you discuss any questions you have with your health care provider. Document Released: 07/30/2016 Document Revised: 09/01/2016 Document Reviewed: 07/30/2016 Elsevier Interactive Patient Education  2017 [employer].   Your provider has requested that you have labwork completed today. Please go to [facility] ([id number]) on the second floor of this building before leaving the office today. You do not need to check in. If you are not called within 15 minutes please check with the front desk.

## 2025-02-19 ENCOUNTER — Ambulatory Visit: Admitting: Orthopedic Surgery

## 2025-03-19 ENCOUNTER — Ambulatory Visit: Admitting: Podiatry

## 2025-07-09 ENCOUNTER — Ambulatory Visit: Payer: Self-pay | Admitting: Physician Assistant
